# Patient Record
Sex: Male | Born: 1935
Health system: Southern US, Community
[De-identification: ages and names within clinical notes are randomized; demographics above are authoritative.]

## PROBLEM LIST (undated history)

## (undated) DIAGNOSIS — H409 Unspecified glaucoma: Secondary | ICD-10-CM

## (undated) DIAGNOSIS — R519 Headache, unspecified: Secondary | ICD-10-CM

## (undated) DIAGNOSIS — F419 Anxiety disorder, unspecified: Secondary | ICD-10-CM

## (undated) DIAGNOSIS — I8390 Asymptomatic varicose veins of unspecified lower extremity: Secondary | ICD-10-CM

## (undated) DIAGNOSIS — Z923 Personal history of irradiation: Secondary | ICD-10-CM

## (undated) DIAGNOSIS — F0390 Unspecified dementia without behavioral disturbance: Secondary | ICD-10-CM

## (undated) DIAGNOSIS — E039 Hypothyroidism, unspecified: Secondary | ICD-10-CM

## (undated) DIAGNOSIS — G8929 Other chronic pain: Secondary | ICD-10-CM

## (undated) DIAGNOSIS — F1020 Alcohol dependence, uncomplicated: Secondary | ICD-10-CM

## (undated) DIAGNOSIS — C801 Malignant (primary) neoplasm, unspecified: Secondary | ICD-10-CM

## (undated) DIAGNOSIS — M1990 Unspecified osteoarthritis, unspecified site: Secondary | ICD-10-CM

## (undated) DIAGNOSIS — I10 Essential (primary) hypertension: Secondary | ICD-10-CM

## (undated) DIAGNOSIS — E785 Hyperlipidemia, unspecified: Secondary | ICD-10-CM

## (undated) DIAGNOSIS — R51 Headache: Secondary | ICD-10-CM

## (undated) DIAGNOSIS — G4700 Insomnia, unspecified: Secondary | ICD-10-CM

## (undated) DIAGNOSIS — C3490 Malignant neoplasm of unspecified part of unspecified bronchus or lung: Secondary | ICD-10-CM

## (undated) HISTORY — PX: EYE SURGERY: SHX253

## (undated) HISTORY — DX: Hyperlipidemia, unspecified: E78.5

## (undated) HISTORY — DX: Personal history of irradiation: Z92.3

## (undated) HISTORY — DX: Unspecified glaucoma: H40.9

## (undated) HISTORY — DX: Malignant neoplasm of unspecified part of unspecified bronchus or lung: C34.90

## (undated) HISTORY — DX: Other chronic pain: G89.29

## (undated) HISTORY — DX: Alcohol dependence, uncomplicated: F10.20

## (undated) HISTORY — DX: Asymptomatic varicose veins of unspecified lower extremity: I83.90

## (undated) HISTORY — DX: Insomnia, unspecified: G47.00

## (undated) HISTORY — PX: OTHER SURGICAL HISTORY: SHX169

## (undated) HISTORY — DX: Essential (primary) hypertension: I10

## (undated) HISTORY — DX: Anxiety disorder, unspecified: F41.9

---

## 1995-01-16 HISTORY — PX: ANKLE SURGERY: SHX546

## 2000-12-21 ENCOUNTER — Encounter: Payer: Self-pay | Admitting: Family Medicine

## 2000-12-21 ENCOUNTER — Ambulatory Visit (HOSPITAL_COMMUNITY): Admission: RE | Admit: 2000-12-21 | Discharge: 2000-12-21 | Payer: Self-pay | Admitting: Family Medicine

## 2001-07-19 ENCOUNTER — Emergency Department (HOSPITAL_COMMUNITY): Admission: EM | Admit: 2001-07-19 | Discharge: 2001-07-19 | Payer: Self-pay | Admitting: Emergency Medicine

## 2006-07-17 ENCOUNTER — Ambulatory Visit (HOSPITAL_COMMUNITY): Admission: RE | Admit: 2006-07-17 | Discharge: 2006-07-17 | Payer: Self-pay | Admitting: Family Medicine

## 2006-08-02 ENCOUNTER — Encounter (INDEPENDENT_AMBULATORY_CARE_PROVIDER_SITE_OTHER): Payer: Self-pay | Admitting: Interventional Radiology

## 2006-08-02 ENCOUNTER — Ambulatory Visit (HOSPITAL_COMMUNITY): Admission: RE | Admit: 2006-08-02 | Discharge: 2006-08-02 | Payer: Self-pay | Admitting: Family Medicine

## 2006-09-25 ENCOUNTER — Ambulatory Visit (HOSPITAL_COMMUNITY): Admission: RE | Admit: 2006-09-25 | Discharge: 2006-09-25 | Payer: Self-pay | Admitting: Internal Medicine

## 2006-10-30 ENCOUNTER — Ambulatory Visit: Admission: RE | Admit: 2006-10-30 | Discharge: 2007-01-13 | Payer: Self-pay | Admitting: Radiation Oncology

## 2006-10-31 ENCOUNTER — Ambulatory Visit: Payer: Self-pay | Admitting: Dentistry

## 2006-10-31 ENCOUNTER — Encounter: Admission: AD | Admit: 2006-10-31 | Discharge: 2006-10-31 | Payer: Self-pay | Admitting: Dentistry

## 2007-12-31 ENCOUNTER — Ambulatory Visit (HOSPITAL_COMMUNITY): Admission: RE | Admit: 2007-12-31 | Discharge: 2007-12-31 | Payer: Self-pay | Admitting: Internal Medicine

## 2008-03-12 ENCOUNTER — Ambulatory Visit (HOSPITAL_COMMUNITY): Admission: RE | Admit: 2008-03-12 | Discharge: 2008-03-12 | Payer: Self-pay | Admitting: Otolaryngology

## 2008-03-13 ENCOUNTER — Inpatient Hospital Stay (HOSPITAL_COMMUNITY): Admission: EM | Admit: 2008-03-13 | Discharge: 2008-03-18 | Payer: Self-pay | Admitting: Emergency Medicine

## 2008-03-13 ENCOUNTER — Ambulatory Visit: Payer: Self-pay | Admitting: Thoracic Surgery (Cardiothoracic Vascular Surgery)

## 2008-10-28 ENCOUNTER — Ambulatory Visit (HOSPITAL_COMMUNITY): Admission: RE | Admit: 2008-10-28 | Discharge: 2008-10-28 | Payer: Self-pay | Admitting: Internal Medicine

## 2009-03-28 ENCOUNTER — Ambulatory Visit (HOSPITAL_COMMUNITY): Admission: RE | Admit: 2009-03-28 | Discharge: 2009-03-28 | Payer: Self-pay | Admitting: General Surgery

## 2010-02-05 ENCOUNTER — Encounter (HOSPITAL_COMMUNITY): Payer: Self-pay | Admitting: Internal Medicine

## 2010-04-09 LAB — CBC
HCT: 36.1 % — ABNORMAL LOW (ref 39.0–52.0)
MCV: 99.9 fL (ref 78.0–100.0)
Platelets: 149 10*3/uL — ABNORMAL LOW (ref 150–400)
RDW: 13.9 % (ref 11.5–15.5)

## 2010-04-09 LAB — DIFFERENTIAL
Basophils Absolute: 0 10*3/uL (ref 0.0–0.1)
Lymphocytes Relative: 10 % — ABNORMAL LOW (ref 12–46)
Lymphs Abs: 0.6 10*3/uL — ABNORMAL LOW (ref 0.7–4.0)
Monocytes Absolute: 0.7 10*3/uL (ref 0.1–1.0)
Monocytes Relative: 11 % (ref 3–12)
Neutro Abs: 4.4 10*3/uL (ref 1.7–7.7)

## 2010-04-09 LAB — COMPREHENSIVE METABOLIC PANEL
AST: 17 U/L (ref 0–37)
Albumin: 4.6 g/dL (ref 3.5–5.2)
BUN: 21 mg/dL (ref 6–23)
Calcium: 10.5 mg/dL (ref 8.4–10.5)
Creatinine, Ser: 1.5 mg/dL (ref 0.4–1.5)
GFR calc Af Amer: 55 mL/min — ABNORMAL LOW (ref >60)
Total Protein: 7.1 g/dL (ref 6.0–8.3)

## 2010-04-27 LAB — BASIC METABOLIC PANEL
BUN: 18 mg/dL (ref 6–23)
CO2: 21 mEq/L (ref 19–32)
CO2: 22 mEq/L (ref 19–32)
Chloride: 107 mEq/L (ref 96–112)
Chloride: 109 mEq/L (ref 96–112)
Chloride: 110 mEq/L (ref 96–112)
Creatinine, Ser: 1.07 mg/dL (ref 0.4–1.5)
GFR calc Af Amer: >60 mL/min (ref >60)
GFR calc Af Amer: >60 mL/min (ref >60)
GFR calc Af Amer: >60 mL/min (ref >60)
Glucose, Bld: 132 mg/dL — ABNORMAL HIGH (ref 70–99)
Potassium: 3.4 mEq/L — ABNORMAL LOW (ref 3.5–5.1)
Potassium: 3.6 mEq/L (ref 3.5–5.1)
Sodium: 135 mEq/L (ref 135–145)
Sodium: 137 mEq/L (ref 135–145)

## 2010-04-27 LAB — GLUCOSE, CAPILLARY
Glucose-Capillary: 119 mg/dL — ABNORMAL HIGH (ref 70–99)
Glucose-Capillary: 128 mg/dL — ABNORMAL HIGH (ref 70–99)
Glucose-Capillary: 133 mg/dL — ABNORMAL HIGH (ref 70–99)
Glucose-Capillary: 134 mg/dL — ABNORMAL HIGH (ref 70–99)
Glucose-Capillary: 136 mg/dL — ABNORMAL HIGH (ref 70–99)
Glucose-Capillary: 138 mg/dL — ABNORMAL HIGH (ref 70–99)
Glucose-Capillary: 139 mg/dL — ABNORMAL HIGH (ref 70–99)
Glucose-Capillary: 142 mg/dL — ABNORMAL HIGH (ref 70–99)
Glucose-Capillary: 144 mg/dL — ABNORMAL HIGH (ref 70–99)
Glucose-Capillary: 148 mg/dL — ABNORMAL HIGH (ref 70–99)

## 2010-04-27 LAB — COMPREHENSIVE METABOLIC PANEL
ALT: 21 U/L (ref 0–53)
AST: 21 U/L (ref 0–37)
CO2: 21 mEq/L (ref 19–32)
Calcium: 8.9 mg/dL (ref 8.4–10.5)
Chloride: 104 mEq/L (ref 96–112)
Creatinine, Ser: 1.04 mg/dL (ref 0.4–1.5)
GFR calc non Af Amer: >60 mL/min (ref >60)
Glucose, Bld: 130 mg/dL — ABNORMAL HIGH (ref 70–99)
Total Bilirubin: 0.4 mg/dL (ref 0.3–1.2)

## 2010-04-27 LAB — MAGNESIUM: Magnesium: 2 mg/dL (ref 1.5–2.5)

## 2010-04-27 LAB — PHOSPHORUS
Phosphorus: 1.9 mg/dL — ABNORMAL LOW (ref 2.3–4.6)
Phosphorus: 2.8 mg/dL (ref 2.3–4.6)

## 2010-05-02 LAB — DIFFERENTIAL
Basophils Absolute: 0 10*3/uL (ref 0.0–0.1)
Basophils Absolute: 0 10*3/uL (ref 0.0–0.1)
Basophils Relative: 0 % (ref 0–1)
Basophils Relative: 0 % (ref 0–1)
Eosinophils Absolute: 0 K/uL (ref 0.0–0.7)
Eosinophils Relative: 0 % (ref 0–5)
Lymphocytes Relative: 5 % — ABNORMAL LOW (ref 12–46)
Lymphs Abs: 0.5 10*3/uL — ABNORMAL LOW (ref 0.7–4.0)
Monocytes Absolute: 0.6 10*3/uL (ref 0.1–1.0)
Monocytes Relative: 10 % (ref 3–12)
Monocytes Relative: 6 % (ref 3–12)
Neutro Abs: 10.1 10*3/uL — ABNORMAL HIGH (ref 1.7–7.7)
Neutro Abs: 7.1 10*3/uL (ref 1.7–7.7)
Neutrophils Relative %: 82 % — ABNORMAL HIGH (ref 43–77)
Neutrophils Relative %: 89 % — ABNORMAL HIGH (ref 43–77)

## 2010-05-02 LAB — CHOLESTEROL, TOTAL: Cholesterol: 127 mg/dL (ref 0–200)

## 2010-05-02 LAB — POCT I-STAT, CHEM 8
Chloride: 112 mEq/L (ref 96–112)
HCT: 36 % — ABNORMAL LOW (ref 39.0–52.0)
Hemoglobin: 12.2 g/dL — ABNORMAL LOW (ref 13.0–17.0)
Potassium: 3.8 mEq/L (ref 3.5–5.1)
Sodium: 139 mEq/L (ref 135–145)

## 2010-05-02 LAB — GLUCOSE, CAPILLARY
Glucose-Capillary: 136 mg/dL — ABNORMAL HIGH (ref 70–99)
Glucose-Capillary: 141 mg/dL — ABNORMAL HIGH (ref 70–99)
Glucose-Capillary: 161 mg/dL — ABNORMAL HIGH (ref 70–99)

## 2010-05-02 LAB — COMPREHENSIVE METABOLIC PANEL
AST: 16 U/L (ref 0–37)
CO2: 22 mEq/L (ref 19–32)
Calcium: 9.3 mg/dL (ref 8.4–10.5)
Creatinine, Ser: 1.05 mg/dL (ref 0.4–1.5)
GFR calc Af Amer: >60 mL/min (ref >60)
GFR calc non Af Amer: >60 mL/min (ref >60)

## 2010-05-02 LAB — CBC
HCT: 33.1 % — ABNORMAL LOW (ref 39.0–52.0)
HCT: 36.3 % — ABNORMAL LOW (ref 39.0–52.0)
HCT: 37.1 % — ABNORMAL LOW (ref 39.0–52.0)
Hemoglobin: 12.8 g/dL — ABNORMAL LOW (ref 13.0–17.0)
Hemoglobin: 13.1 g/dL (ref 13.0–17.0)
Hemoglobin: 13.9 g/dL (ref 13.0–17.0)
MCHC: 35.1 g/dL (ref 30.0–36.0)
MCHC: 35.2 g/dL (ref 30.0–36.0)
MCHC: 36.2 g/dL — ABNORMAL HIGH (ref 30.0–36.0)
MCV: 102.7 fL — ABNORMAL HIGH (ref 78.0–100.0)
MCV: 103.5 fL — ABNORMAL HIGH (ref 78.0–100.0)
Platelets: 115 10*3/uL — ABNORMAL LOW (ref 150–400)
Platelets: 117 10*3/uL — ABNORMAL LOW (ref 150–400)
Platelets: 143 K/uL — ABNORMAL LOW (ref 150–400)
RBC: 3.34 MIL/uL — ABNORMAL LOW (ref 4.22–5.81)
RBC: 3.59 MIL/uL — ABNORMAL LOW (ref 4.22–5.81)
RDW: 12.3 % (ref 11.5–15.5)
RDW: 12.7 % (ref 11.5–15.5)
RDW: 12.7 % (ref 11.5–15.5)
RDW: 12.9 % (ref 11.5–15.5)
RDW: 13.4 % (ref 11.5–15.5)
WBC: 11.3 10*3/uL — ABNORMAL HIGH (ref 4.0–10.5)

## 2010-05-02 LAB — BASIC METABOLIC PANEL
CO2: 26 mEq/L (ref 19–32)
Calcium: 11 mg/dL — ABNORMAL HIGH (ref 8.4–10.5)
Chloride: 106 mEq/L (ref 96–112)
Creatinine, Ser: 1.49 mg/dL (ref 0.4–1.5)
GFR calc Af Amer: 56 mL/min — ABNORMAL LOW (ref >60)
GFR calc non Af Amer: 46 mL/min — ABNORMAL LOW (ref >60)
GFR calc non Af Amer: 53 mL/min — ABNORMAL LOW (ref >60)
Glucose, Bld: 169 mg/dL — ABNORMAL HIGH (ref 70–99)
Glucose, Bld: 98 mg/dL (ref 70–99)
Potassium: 4.5 mEq/L (ref 3.5–5.1)
Sodium: 140 mEq/L (ref 135–145)
Sodium: 140 mEq/L (ref 135–145)

## 2010-05-02 LAB — TROPONIN I: Troponin I: <0.01 ng/mL (ref 0.00–0.06)

## 2010-05-02 LAB — PHOSPHORUS: Phosphorus: 1.4 mg/dL — ABNORMAL LOW (ref 2.3–4.6)

## 2010-05-30 NOTE — Consult Note (Signed)
NAMEESLEY, BROOKING NO.:  [id number]   MEDICAL RECORD NO.:  [id number]          PATIENT TYPE:  INP   LOCATION:  2305                         FACILITY:  [facility]   PHYSICIAN:  Salvatore Decent. Cornelius Moras, M.D. DATE OF BIRTH:  [birth year]   DATE OF CONSULTATION:  03/13/2008  DATE OF DISCHARGE:                                 CONSULTATION   REQUESTING PHYSICIAN:  Lucky Cowboy, MD   REASON FOR CONSULTATION:  Iatrogenic esophageal perforation.   HISTORY OF PRESENT ILLNESS:  Mr. Manon is a 77 year old white male  with history of hypertension, hyperlipidemia, and longstanding tobacco  abuse as well as history of squamous cell carcinoma of the head and  neck.  At present the [facility] medical record electronic system is  down and old charts are not available.  By report, the patient had  squamous cell carcinoma of the head and neck diagnosed approximately one  year ago.  He has been treated with primary radiation therapy and  chemotherapy.  His last treatment was approximately 8 months ago.  The  patient states that ever since he started radiation therapy, he has been  having severe difficulty with swallowing.  For quite some time, he had a  gastrostomy tube in place, but the patient did not like this and he  ultimately removed his own tube.  His dysphagia has progressed.  Approximately 2 weeks ago, a barium swallow contrast study was performed  demonstrating long segment stricture of the proximal esophagus.  The  patient underwent outpatient esophageal dilatation by Dr. Serena Colonel at  approximately 2 in the afternoon on March 12, 2008.  By report, this  procedure was not done under fluoroscopy.  The patient was reportedly  noted to have a stricture at approximately 18 cm from the incisors.  He  was dilated to a 36-French Maloney dilator.  The procedure was initially  uncomplicated.  The patient states that he started having some chest  discomfort prior to discharge.  After he  got to home, the pain became  severe ultimately prompting him to call and return to the emergency  room.  There he was evaluated by Dr. Langston Reusing.  The chest CT scan was  performed with oral contrast.  The patient was not able to drink much  contrast.  The CT scan demonstrates diffusely thickened esophagus  including the entire esophagus from the cervical esophagus all the way  to the GE junction.  There is some air tracking through the esophageal  wall and mediastinum as well.  Very little contrast was taken and a  definitive site of leak cannot be clearly identified, although the  esophagus itself is diffusely abnormal throughout.  Cardiothoracic  surgical consultation was requested.   REVIEW OF SYSTEMS:  Notable for 20-pound weight loss.  The patient had  difficulty swallowing and has had severe dysphagia for nearly a year.  He has continued to smoke cigarettes.  He denies any chest pain prior to  that which developed today.  Denies any nausea or vomiting.  His bowel  function has been regular.   PAST MEDICAL HISTORY:  1. Squamous cell carcinoma of the head and neck.  2. Hypertension.  3. Hyperlipidemia.  4. Longstanding tobacco abuse.  5. Inguinal hernia.   PAST SURGICAL HISTORY:  Percutaneous endoscopic gastrostomy tube  placement and biopsy of left cervical lymph node.   FAMILY HISTORY:  Noncontributory.   SOCIAL HISTORY:  The patient has history of tobacco abuse.  He denies  alcohol use.   MEDICATIONS PRIOR TO ADMISSION:  1. Amlodipine 2.5 mg daily.  2. Clonazepam 1 mg twice daily.  3. Hydrocodone as necessary for pain.  4. Temazepam as needed.  5. Fenofibrate 160 mg daily.   DRUG ALLERGIES:  None known.   PHYSICAL EXAMINATION:  GENERAL:  The patient is a thin male who appears  his stated age and in no acute distress.  VITAL SIGNS:  He has been afebrile.  Blood pressure ranging between 140  and 170 mmHg systolic.  HEENT:  Notable for a scar on the neck.  NECK:   There is no palpable lymphadenopathy.  LUNGS:  Clear to auscultation.  The chest reveals clear breath sounds,  which are symmetrical.  No wheezes or rhonchi noted.  CARDIOVASCULAR:  Regular rate and rhythm.  No murmurs, rubs, or gallops  are appreciated.  ABDOMEN:  Soft, nondistended, and nontender.  Bowel sounds are present.  EXTREMITIES:  Warm and well perfused.  There is no lower extremity  edema.  RECTAL AND GU:  Both deferred.  NEUROLOGIC:  Nonfocal.   DIAGNOSTIC TESTS:  White blood count is 11,300.  Chest CT scan performed  this evening is reviewed.  Findings are as discussed previously.  The  patient was subsequently brought back to Radiology on my request where  upper GI contrast study was performed.  Initially, the patient was given  Gastrografin and this was followed with thin barium.  Contrast is  rapidly swallowed through the esophagus and there is no obvious site of  perforation at any level.  There is some mild hangup at the GE junction,  but contrast does pass through when rinsed with water.  No sign of  perforation can be clearly identified.   IMPRESSION:  Iatrogenic perforation of the esophagus following Maloney  dilation in the setting of radiation induced esophagitis with esophageal  stricture.  At present, the level of the injury itself cannot be clearly  identified, although presumably it is likely in the proximal esophagus  near the long segment high-grade stricture.  There is not any  extravasation of contrast present at all that can be demonstrated on  barium swallow, there is no sign of any fluid collection in the  mediastinum or chest on CT scan.  I do not feel that surgical  exploration is necessary at this time.  However, the patient's condition  remains guarded.   RECOMMENDATIONS:  I recommend admitting the patient and keeping him  strictly n.p.o., placing him on intravenous antibiotics, and starting  intravenous nutritional support.  We will continue to  follow along  closely.  He may need open gastrostomy tube placement for long-term  nutritional support.      Salvatore Decent. Cornelius Moras, M.D.  Electronically Signed     CHO/MEDQ  D:  03/13/2008  T:  03/13/2008  Job:  [id number]   cc:   Lucky Cowboy, MD  Jeannett Senior. Pollyann Kennedy, MD

## 2010-05-30 NOTE — Op Note (Signed)
Kevin Ho, Kevin Ho NO.:  [id number]   MEDICAL RECORD NO.:  [id number]          PATIENT TYPE:  INP   LOCATION:  2305                         FACILITY:  [facility]   PHYSICIAN:  Jefry H. Pollyann Kennedy, MD     DATE OF BIRTH:  Dec 14, 1931   DATE OF PROCEDURE:  03/12/2008  DATE OF DISCHARGE:                               OPERATIVE REPORT   PREOPERATIVE DIAGNOSIS:  Esophageal stricture secondary to radiation  therapy.   POSTOPERATIVE DIAGNOSIS:  Esophageal stricture secondary to radiation  therapy.   PROCEDURE:  Esophagoscopy with dilatation of esophageal stricture.   SURGEON:  Jefry H. Pollyann Kennedy, MD   ANESTHESIA:  General endotracheal anesthesia was used.   COMPLICATIONS:  None.   ESTIMATED BLOOD LOSS:  Minimal.   FINDINGS:  There was a thin web at about 18 cm from the mandibular  gingiva that was divided and around that area was also a stricture that  was dilated up to a 38-French.   HISTORY:  This 77 year old underwent chemo and radiation for a unknown  primary.  He has been having severe dysphagia ever since.  A barium  esophagram reveals stricture of the cervical esophagus.  Risks,  benefits, alternatives, and complications of the procedure explained to  the patient, seemed to understand, and agreed to surgery.   PROCEDURE:  The patient was taken to the operating room, placed on the  operating table in a supine position.  Following induction of general  endotracheal anesthesia, the table was turned 90 degrees and the patient  was draped in standard fashion.  The rigid cervical esophagoscope was  used to examine the hypopharynx and the esophageal introitus, which  revealed fairly normal-appearing cricopharyngeal region.  The scope was  entered into the esophagus, advanced until the stricture was identified.  The web was identified and was lysed using a 22-French Maloney taper  dilator.  The dilatation was then advanced serially up until that would  no longer fit  through the esophagoscope.  The esophagoscope was then  removed and replaced with a Jackson sliding laryngoscope, which was then  used to elevate the larynx and exposed the esophageal introitus.  Serial  dilations were then performed up to 38-French.  Further dilations were  not attempted.  A small amount of blood was suctioned.  The scope was  removed, and the patient was then awakened, extubated, and transferred  to recovery in stable condition.     Jefry H. Pollyann Kennedy, MD  Electronically Signed    JHR/MEDQ  D:  03/12/2008  T:  03/13/2008  Job:  [id number]

## 2010-05-30 NOTE — Discharge Summary (Signed)
NAMEGLENN, Kevin Ho NO.:  [id number]   MEDICAL RECORD NO.:  [id number]          PATIENT TYPE:  INP   LOCATION:  5153                         FACILITY:  [facility]   PHYSICIAN:  Jefry H. Pollyann Kennedy, MD     DATE OF BIRTH:  1931/08/02   DATE OF ADMISSION:  03/13/2008  DATE OF DISCHARGE:  03/18/2008                               DISCHARGE SUMMARY   ADMISSION DIAGNOSIS:  Status post esophageal dilatation with possible  esophageal perforation.   DISCHARGE DIAGNOSIS:  Status post esophageal dilatation with possible  esophageal perforation.   HISTORY:  This is a 77 year old gentleman who underwent chemo and  radiation therapy for an unknown primary in head and neck area.  He was  found to have esophageal stricture post radiation and was brought in  electively for esophageal dilatation.  Procedure went uneventfully.  He  was discharged to home later that day and then returned later in the  evening when he noticed that drinking caused severe chest pain.  He is  admitted to the hospital.  CT scan revealed severe esophagitis, small  amount of air in the mediastinum without any definite extravasation of  contrast material.  He is admitted by Dr. Lucky Cowboy, one of my partners  and consultation was obtained with Dr. Cornelius Moras from Cardiothoracic Surgery.  The patient was kept n.p.o.  There is no chest exploration that was  performed.  He was treated with intravenous fluid support and started on  total parenteral nutrition.  After about 24 hours, his symptoms  completely resolved.  He remained afebrile with a normal white blood  cell count.  On March 16, 2008, he underwent a barium swallow, which  revealed easy passage of barium through the esophagus without any  extravasation or any evidence of perforation.  Dr. Dewayne Shorter of  Cardiothoracic Surgery was then reconsulted and he agreed with starting  him on a liquid diet.  He was started on liquid diet on March 17, 2008,  did extremely well  and in fact, was swallowing better than he had before  the procedure.  He was advanced to full liquid diet on the day of  discharge, did well with that.  No signs of any additional symptoms.  He  was discharged to home in satisfactory condition.  He is instructed to  maintain a liquid diet for another week and if all goes well, then he  can start solids following that.  He is to follow up with me in the  office in 2 weeks.  Additional followup testing and/or treatments will  be discussed in the future if necessary.  During his hospitalization, he  was found to have severe arthritis in his knees and started undergoing  some physical therapy and rehabilitation, which will be continued as an  outpatient.      Jefry H. Pollyann Kennedy, MD  Electronically Signed     JHR/MEDQ  D:  03/18/2008  T:  03/19/2008  Job:  [id number]   cc:   Lebron Conners. Darovsky, M.D.

## 2010-05-30 NOTE — H&P (Signed)
NAMESHARAD, VANEATON NO.:  [id number]   MEDICAL RECORD NO.:  [id number]          PATIENT TYPE:  INP   LOCATION:  2305                         FACILITY:  [facility]   PHYSICIAN:  Lucky Cowboy, MD         DATE OF BIRTH:  1931-06-16   DATE OF ADMISSION:  03/12/2008  DATE OF DISCHARGE:                              HISTORY & PHYSICAL   HISTORY:  This patient is a 77 year old male who is status post  esophageal dilation by Dr. Serena Colonel earlier today for radiation-  induced/associated esophageal stenosis.  Surgery reportedly was without  complication.  The patient went home and later today  developed mid  chest pain that he describes to be shooting up from the mid esophagus  towards the laryngeal level.  There is pain in the diaphragm area by his  own words.  There is no fever.  He took 2 pain pills at home with some  relief, but not amelioration.  He then had again increasing pain.  He  presented to the emergency room at the urging of his sister-in-law.  This is for further evaluation at my recommendation.  After admission to  the emergency room, he was given 2 more pain pills with improvement in  pain.  He is now asking to go home.   PAST MEDICAL HISTORY:  Hypertension, cancer of the oropharynx, chronic  neck pain, renal insufficiency.   PAST SURGICAL HISTORY:  Vasectomy, appears to be a left neck dissection.   SOCIAL HISTORY:  Nondrinker, no drug abuse, chronic smoker.   ALLERGIES:  No known drug allergies.   MEDICATIONS:  Amlodipine, clonazepam, Vicodin, fenofibrate, temazepam,  and Tylenol.   FAMILY HISTORY:  Reviewed.   PHYSICAL EXAMINATION:  GENERAL:  Resting in bed, appears comfortable.  HEENT:  Nose, no erythema or exudate.  Oral cavity, dry mucous membranes  with geographic tongue.  Thickening of secretions.  NECK:  Normal landmarks.  No thyromegaly.  No crepitus.  CHEST:  Distant breath sounds.  No wheezes or rhonchi.  CARDIAC:  Regular rate and rhythm  without murmur.  ABDOMEN:  Soft, scaphoid with positive bowel sounds.  EXTREMITIES:  No cyanosis, clubbing, or edema.   A chest x-ray, no evidence of perforation, no free air noted, no  evidence of mediastinitis.  Also, white blood cell count 11,300.  Chest  CT concerning for perforation of the lower thoracic esophagus.  Return  barium swallow with contrast using Gastrografin performed,  then  followed by thin barium.  No obvious sign of perforation at any level.   ASSESSMENT/PLAN:  Esophageal perforation at the level of dilation, which  was 18 cm from the lower gingiva.  This was in a patient who has  radiation and possibly reflux-induced chronic esophagitis.  Dr. Tressie Stalker has been consulted and done the latter portion of the workup  including the followup barium and Gastrografin swallow.  He has  recommended n.p.o., admission to the ICU with Zosyn and did write the  initial orders.  He will be on stand-  by if needed.  This has been fully  discussed with the patient and his  daughter-in-law by me.  It was also discussed with Dr. Serena Colonel by  phone, particularly with regard to the findings at the time of surgery,  which were then communicated to Dr. Cornelius Moras.      Lucky Cowboy, MD  Electronically Signed     SJ/MEDQ  D:  03/14/2008  T:  03/15/2008  Job:  [id number]   cc:   Jeannett Senior. Pollyann Kennedy, MD

## 2010-06-14 ENCOUNTER — Encounter: Payer: Self-pay | Admitting: Physician Assistant

## 2010-10-20 LAB — GLUCOSE, CAPILLARY: Glucose-Capillary: 96 mg/dL (ref 70–99)

## 2010-10-30 LAB — CBC
Hemoglobin: 16.6
RBC: 5.13

## 2011-06-14 ENCOUNTER — Encounter: Payer: Medicare HMO | Admitting: Internal Medicine

## 2011-07-14 ENCOUNTER — Encounter (HOSPITAL_BASED_OUTPATIENT_CLINIC_OR_DEPARTMENT_OTHER): Payer: Self-pay | Admitting: Emergency Medicine

## 2011-07-14 ENCOUNTER — Emergency Department (HOSPITAL_BASED_OUTPATIENT_CLINIC_OR_DEPARTMENT_OTHER)
Admission: EM | Admit: 2011-07-14 | Discharge: 2011-07-14 | Disposition: A | Discharge status: Home / Self Care | Payer: Medicare HMO | Attending: Emergency Medicine | Admitting: Emergency Medicine

## 2011-07-14 DIAGNOSIS — E785 Hyperlipidemia, unspecified: Secondary | ICD-10-CM | POA: Insufficient documentation

## 2011-07-14 DIAGNOSIS — R899 Unspecified abnormal finding in specimens from other organs, systems and tissues: Secondary | ICD-10-CM

## 2011-07-14 DIAGNOSIS — I10 Essential (primary) hypertension: Secondary | ICD-10-CM | POA: Insufficient documentation

## 2011-07-14 DIAGNOSIS — Z79899 Other long term (current) drug therapy: Secondary | ICD-10-CM | POA: Insufficient documentation

## 2011-07-14 DIAGNOSIS — R799 Abnormal finding of blood chemistry, unspecified: Secondary | ICD-10-CM | POA: Insufficient documentation

## 2011-07-14 DIAGNOSIS — Z7982 Long term (current) use of aspirin: Secondary | ICD-10-CM | POA: Insufficient documentation

## 2011-07-14 DIAGNOSIS — F411 Generalized anxiety disorder: Secondary | ICD-10-CM | POA: Insufficient documentation

## 2011-07-14 DIAGNOSIS — F17200 Nicotine dependence, unspecified, uncomplicated: Secondary | ICD-10-CM | POA: Insufficient documentation

## 2011-07-14 DIAGNOSIS — G4700 Insomnia, unspecified: Secondary | ICD-10-CM | POA: Insufficient documentation

## 2011-07-14 DIAGNOSIS — Z888 Allergy status to other drugs, medicaments and biological substances status: Secondary | ICD-10-CM | POA: Insufficient documentation

## 2011-07-14 LAB — BASIC METABOLIC PANEL
BUN: 27 mg/dL — ABNORMAL HIGH (ref 6–23)
Chloride: 102 mEq/L (ref 96–112)
GFR calc Af Amer: 38 mL/min — ABNORMAL LOW (ref >90)
Glucose, Bld: 104 mg/dL — ABNORMAL HIGH (ref 70–99)
Potassium: 4.5 mEq/L (ref 3.5–5.1)
Sodium: 139 mEq/L (ref 135–145)

## 2011-07-14 NOTE — ED Notes (Signed)
Son Dorene Sorrow leaving for 30 min. [phone]

## 2011-07-14 NOTE — ED Notes (Signed)
Pt went to PCP at [facility] yesterday for routine visit. Told potassium was elevated and needed to go to ED. Pt states he has been weak since receiving chemo and radiation 1 year ago.

## 2011-07-14 NOTE — ED Provider Notes (Signed)
History     CSN: [id number]  Arrival date & time 07/14/11  1135   First MD Initiated Contact with Patient 07/14/11 1154      Chief Complaint  Patient presents with  . Abnormal Lab    (Consider location/radiation/quality/duration/timing/severity/associated sxs/prior treatment) HPI Patient is a 78 year old male who presents today for repeat laboratory testing since his primary care office contacted him stating that his potassium level was too high. This is checked at their office yesterday. Patient has had absolutely no symptoms and reports that he is feeling well. He denies chest pain, shortness of breath, fever, cough, or any other acute changes in his health. Patient does have a history of some renal insufficiency per his son who is at the bedside but they're unaware of what his baseline creatinine is. Patient has never been on dialysis. Patient is absolutely asymptomatic. There are no other associated or modifying factors. Past Medical History  Diagnosis Date  . Hyperlipidemia   . Hypertension   . Chronic pain   . Anxiety   . Insomnia     Past Surgical History  Procedure Date  . Right inguinal hernia     History reviewed. No pertinent family history.  History  Substance Use Topics  . Smoking status: Current Everyday Smoker -- 1.0 packs/day    Types: Cigarettes  . Smokeless tobacco: Not on file  . Alcohol Use: No      Review of Systems  Constitutional: Negative.   HENT: Negative.   Eyes: Negative.   Respiratory: Negative.   Cardiovascular: Negative.   Gastrointestinal: Negative.   Genitourinary: Negative.   Musculoskeletal: Negative.   Skin: Negative.   Neurological: Negative.   Hematological: Negative.   Psychiatric/Behavioral: Negative.   All other systems reviewed and are negative.    Allergies  Chantix  Home Medications   Current Outpatient Rx  Name Route Sig Dispense Refill  . AMLODIPINE BESYLATE 5 MG PO TABS Oral Take 5 mg by mouth daily.        . ASPIRIN 325 MG PO TABS Oral Take 325 mg by mouth daily.    . BUSPIRONE HCL 5 MG PO TABS Oral Take 5 mg by mouth 3 (three) times daily.      Marland Kitchen HYDROCODONE-ACETAMINOPHEN 5-500 MG PO TABS Oral Take 1 tablet by mouth every 8 (eight) hours as needed.      Marland Kitchen PRAVASTATIN SODIUM 40 MG PO TABS Oral Take 40 mg by mouth daily.    . FENOFIBRATE 160 MG PO TABS Oral Take 160 mg by mouth at bedtime.      Marland Kitchen TEMAZEPAM 30 MG PO CAPS Oral Take 30 mg by mouth at bedtime as needed.        BP 143/60  Pulse 68  Temp 98.4 F (36.9 C) (Oral)  Resp 20  Ht 6\' 8"  (2.032 m)  Wt 139 lb (63.05 kg)  BMI 15.27 kg/m2  SpO2 100%  Physical Exam  Nursing note and vitals reviewed. GEN: Well-developed, chronically ill appearing male in no distress HEENT: Atraumatic, normocephalic.  EYES: PERRLA BL, no scleral icterus. NECK: Trachea midline, no meningismus CV: regular rate and rhythm. No murmurs, rubs, or gallops PULM: No respiratory distress.  No crackles, wheezes, or rales. GI: soft, non-tender. No guarding, rebound, or tenderness. + bowel sounds  GU: deferred Neuro: cranial nerves grossly 2-12 intact, no abnormalities of strength or sensation, A and O x 3 MSK: Patient moves all 4 extremities symmetrically, no deformity, edema, or injury noted Skin: No rashes  petechiae, purpura, or jaundice Psych: no abnormality of mood   ED Course  Procedures (including critical care time)  Indication: possible hyperkalemia Please note this EKG was reviewed extemporaneously by myself.   Date: 07/14/2011  Rate: 61  Rhythm: normal sinus rhythm and premature atrial contractions (PAC)  QRS Axis: indeterminate  Intervals: normal  ST/T Wave abnormalities: nonspecific ST/T changes  Conduction Disutrbances:right bundle branch block and left anterior fascicular block  Narrative Interpretation: notation of new LAFB compared with EKG from 2011  Old EKG Reviewed: changes noted     Labs Reviewed  BASIC METABOLIC PANEL -  Abnormal; Notable for the following:    Glucose, Bld 104 (*)     BUN 27 (*)     Creatinine, Ser 1.90 (*)     GFR calc non Af Amer 32 (*)     GFR calc Af Amer 38 (*)     All other components within normal limits   No results found.   1. Abnormal laboratory test       MDM  Patient was evaluated by myself. Patient had absolutely no symptoms and EKG was performed to evaluate for possible signs of hyperkalemia. Comparison EKG was from 2011. While there was some change in the morphology patient had no acute ischemic changes and absolutely no chest pain or shortness of breath. Patient no signs concerning for hyperkalemia. He did have a an elevated BUN and creatinine ratio but his potassium was 4.5 today. Patient did admit that office staff had some difficulty with getting his blood yesterday and hemolysis may have explained the previous hyperkalemic value on those labs. He had not had a confirmatory lab performed prior to today. Given that patient has no other symptoms and no evidence of hyperkalemia today no further workup was indicated. Patient was discharged in good condition. He can followup with his primary care physician and was told to notify them on Monday of this visit so they're aware of the results.Cyndra Numbers, MD 07/15/11 [phone]

## 2011-07-14 NOTE — Discharge Instructions (Signed)
Your laboratory testing today did not show any elevation of your potassium. Your BUN was 27 and your creatinine was 1.9. Please call your regular doctor on Monday to let them know about your testing today.

## 2011-08-27 ENCOUNTER — Encounter: Payer: Medicare HMO | Admitting: Internal Medicine

## 2011-09-13 ENCOUNTER — Encounter (INDEPENDENT_AMBULATORY_CARE_PROVIDER_SITE_OTHER): Payer: Medicare HMO | Admitting: Internal Medicine

## 2011-09-13 DIAGNOSIS — N189 Chronic kidney disease, unspecified: Secondary | ICD-10-CM

## 2011-09-13 DIAGNOSIS — G939 Disorder of brain, unspecified: Secondary | ICD-10-CM

## 2011-09-13 DIAGNOSIS — R222 Localized swelling, mass and lump, trunk: Secondary | ICD-10-CM

## 2011-09-13 DIAGNOSIS — C760 Malignant neoplasm of head, face and neck: Secondary | ICD-10-CM

## 2011-10-04 ENCOUNTER — Encounter: Payer: Medicare HMO | Admitting: Internal Medicine

## 2011-10-04 DIAGNOSIS — R911 Solitary pulmonary nodule: Secondary | ICD-10-CM

## 2011-10-04 DIAGNOSIS — C760 Malignant neoplasm of head, face and neck: Secondary | ICD-10-CM

## 2011-10-04 DIAGNOSIS — J449 Chronic obstructive pulmonary disease, unspecified: Secondary | ICD-10-CM

## 2012-01-24 ENCOUNTER — Encounter: Payer: Medicare HMO | Admitting: Internal Medicine

## 2012-01-24 DIAGNOSIS — C760 Malignant neoplasm of head, face and neck: Secondary | ICD-10-CM

## 2012-01-24 DIAGNOSIS — R944 Abnormal results of kidney function studies: Secondary | ICD-10-CM

## 2012-03-11 ENCOUNTER — Encounter (INDEPENDENT_AMBULATORY_CARE_PROVIDER_SITE_OTHER): Payer: Medicare HMO | Admitting: Internal Medicine

## 2012-03-11 DIAGNOSIS — C760 Malignant neoplasm of head, face and neck: Secondary | ICD-10-CM

## 2012-03-11 DIAGNOSIS — R42 Dizziness and giddiness: Secondary | ICD-10-CM

## 2012-03-11 DIAGNOSIS — N189 Chronic kidney disease, unspecified: Secondary | ICD-10-CM

## 2012-04-02 ENCOUNTER — Other Ambulatory Visit: Payer: Self-pay | Admitting: *Deleted

## 2012-04-08 ENCOUNTER — Other Ambulatory Visit: Payer: Self-pay | Admitting: Physician Assistant

## 2012-04-08 NOTE — Telephone Encounter (Signed)
Last seen 6/13 

## 2012-04-09 ENCOUNTER — Other Ambulatory Visit: Payer: Self-pay | Admitting: Physician Assistant

## 2012-04-17 ENCOUNTER — Other Ambulatory Visit: Payer: Self-pay | Admitting: Physician Assistant

## 2012-04-17 DIAGNOSIS — F411 Generalized anxiety disorder: Secondary | ICD-10-CM

## 2012-04-17 MED ORDER — BUSPIRONE HCL 5 MG PO TABS: 5.0000 mg | ORAL_TABLET | Freq: Three times a day (TID) | ORAL | Status: DC

## 2012-04-17 NOTE — Telephone Encounter (Signed)
dont see this in his chart, chart is there for you to review

## 2012-04-17 NOTE — Telephone Encounter (Signed)
authorized

## 2012-04-21 ENCOUNTER — Other Ambulatory Visit: Payer: Self-pay | Admitting: Physician Assistant

## 2012-04-21 NOTE — Telephone Encounter (Signed)
Last refill 03/20/12, last seen by [facility] 07/13/11. If approved have nurse call into [facility]

## 2012-05-08 ENCOUNTER — Other Ambulatory Visit: Payer: Self-pay | Admitting: *Deleted

## 2012-05-08 MED ORDER — FENOFIBRATE 160 MG PO TABS: 160.0000 mg | ORAL_TABLET | Freq: Every day | ORAL | Status: DC

## 2012-05-08 NOTE — Telephone Encounter (Signed)
LAS OV 6/13. LAST LABS 6/13

## 2012-05-18 ENCOUNTER — Other Ambulatory Visit: Payer: Self-pay | Admitting: Family Medicine

## 2012-05-19 NOTE — Telephone Encounter (Signed)
Patient last seen in office on 07-13-11. Rx last filled on 4-30. Please advise. Thank you

## 2012-05-20 ENCOUNTER — Other Ambulatory Visit: Payer: Self-pay | Admitting: Family Medicine

## 2012-05-20 NOTE — Telephone Encounter (Signed)
Phoned into [facility] and left message on their voicemail since prescription was requested through [employer].

## 2012-05-21 NOTE — Telephone Encounter (Signed)
LAST RF 04/22/12. CALL IN [facility]. LAST OV 6/13.

## 2012-05-21 NOTE — Telephone Encounter (Signed)
rx called to [facility]- 5/6-jhb-  NA , NO VM at pts #

## 2012-05-21 NOTE — Telephone Encounter (Signed)
Please call in temazepam to Pharmacy

## 2012-05-29 ENCOUNTER — Ambulatory Visit: Payer: Self-pay | Admitting: Nurse Practitioner

## 2012-06-10 ENCOUNTER — Other Ambulatory Visit: Payer: Self-pay | Admitting: Family Medicine

## 2012-06-20 ENCOUNTER — Other Ambulatory Visit: Payer: Self-pay | Admitting: Family Medicine

## 2012-06-20 ENCOUNTER — Ambulatory Visit (INDEPENDENT_AMBULATORY_CARE_PROVIDER_SITE_OTHER): Payer: Medicare HMO | Admitting: Physician Assistant

## 2012-06-20 ENCOUNTER — Encounter: Payer: Self-pay | Admitting: Physician Assistant

## 2012-06-20 VITALS — BP 160/85 | HR 75 | Temp 96.8°F | Ht 63.0 in | Wt 144.0 lb

## 2012-06-20 DIAGNOSIS — E039 Hypothyroidism, unspecified: Secondary | ICD-10-CM

## 2012-06-20 DIAGNOSIS — F411 Generalized anxiety disorder: Secondary | ICD-10-CM

## 2012-06-20 DIAGNOSIS — I10 Essential (primary) hypertension: Secondary | ICD-10-CM

## 2012-06-20 DIAGNOSIS — E785 Hyperlipidemia, unspecified: Secondary | ICD-10-CM | POA: Insufficient documentation

## 2012-06-20 LAB — LIPID PANEL
Cholesterol: 190 mg/dL (ref 0–200)
HDL: 48 mg/dL (ref >39)
Total CHOL/HDL Ratio: 4 Ratio
Triglycerides: 287 mg/dL — ABNORMAL HIGH (ref <150)
VLDL: 57 mg/dL — ABNORMAL HIGH (ref 0–40)

## 2012-06-20 LAB — POCT CBC
Granulocyte percent: 79.8 %G (ref 37–80)
HCT, POC: 46.3 % (ref 43.5–53.7)
Lymph, poc: 1.3 (ref 0.6–3.4)
MCH, POC: 32.2 pg — AB (ref 27–31.2)
MCV: 94.3 fL (ref 80–97)
RBC: 4.9 M/uL (ref 4.69–6.13)
WBC: 7.9 10*3/uL (ref 4.6–10.2)

## 2012-06-20 LAB — BASIC METABOLIC PANEL WITH GFR
BUN: 13 mg/dL (ref 6–23)
CO2: 28 mEq/L (ref 19–32)
Calcium: 10.7 mg/dL — ABNORMAL HIGH (ref 8.4–10.5)
GFR, Est African American: 52 mL/min — ABNORMAL LOW
Glucose, Bld: 92 mg/dL (ref 70–99)
Sodium: 137 mEq/L (ref 135–145)

## 2012-06-20 LAB — HEPATIC FUNCTION PANEL
ALT: 11 U/L (ref 0–53)
AST: 18 U/L (ref 0–37)
Bilirubin, Direct: 0.1 mg/dL (ref 0.0–0.3)
Indirect Bilirubin: 0.4 mg/dL (ref 0.0–0.9)
Total Protein: 7.8 g/dL (ref 6.0–8.3)

## 2012-06-20 MED ORDER — LEVOTHYROXINE SODIUM 75 MCG PO TABS: 75.0000 ug | ORAL_TABLET | Freq: Every day | ORAL | Status: DC

## 2012-06-20 MED ORDER — AMLODIPINE BESYLATE 5 MG PO TABS: 5.0000 mg | ORAL_TABLET | Freq: Every day | ORAL | Status: DC

## 2012-06-20 MED ORDER — PRAVASTATIN SODIUM 40 MG PO TABS: 40.0000 mg | ORAL_TABLET | Freq: Every day | ORAL | Status: DC

## 2012-06-20 NOTE — Patient Instructions (Signed)
Smoking Cessation Quitting smoking is important to your health and has many advantages. However, it is not always easy to quit since nicotine is a very addictive drug. Often times, people try 3 times or more before being able to quit. This document explains the best ways for you to prepare to quit smoking. Quitting takes hard work and a lot of effort, but you can do it. ADVANTAGES OF QUITTING SMOKING  You will live longer, feel better, and live better.  Your body will feel the impact of quitting smoking almost immediately.  Within 20 minutes, blood pressure decreases. Your pulse returns to its normal level.  After 8 hours, carbon monoxide levels in the blood return to normal. Your oxygen level increases.  After 24 hours, the chance of having a heart attack starts to decrease. Your breath, hair, and body stop smelling like smoke.  After 48 hours, damaged nerve endings begin to recover. Your sense of taste and smell improve.  After 72 hours, the body is virtually free of nicotine. Your bronchial tubes relax and breathing becomes easier.  After 2 to 12 weeks, lungs can hold more air. Exercise becomes easier and circulation improves.  The risk of having a heart attack, stroke, cancer, or lung disease is greatly reduced.  After 1 year, the risk of coronary heart disease is cut in half.  After 5 years, the risk of stroke falls to the same as a nonsmoker.  After 10 years, the risk of lung cancer is cut in half and the risk of other cancers decreases significantly.  After 15 years, the risk of coronary heart disease drops, usually to the level of a nonsmoker.  If you are pregnant, quitting smoking will improve your chances of having a healthy baby.  The people you live with, especially any children, will be healthier.  You will have extra money to spend on things other than cigarettes. QUESTIONS TO THINK ABOUT BEFORE ATTEMPTING TO QUIT You may want to talk about your answers with your  caregiver.  Why do you want to quit?  If you tried to quit in the past, what helped and what did not?  What will be the most difficult situations for you after you quit? How will you plan to handle them?  Who can help you through the tough times? Your family? Friends? A caregiver?  What pleasures do you get from smoking? What ways can you still get pleasure if you quit? Here are some questions to ask your caregiver:  How can you help me to be successful at quitting?  What medicine do you think would be best for me and how should I take it?  What should I do if I need more help?  What is smoking withdrawal like? How can I get information on withdrawal? GET READY  Set a quit date.  Change your environment by getting rid of all cigarettes, ashtrays, matches, and lighters in your home, car, or work. Do not let people smoke in your home.  Review your past attempts to quit. Think about what worked and what did not. GET SUPPORT AND ENCOURAGEMENT You have a better chance of being successful if you have help. You can get support in many ways.  Tell your family, friends, and co-workers that you are going to quit and need their support. Ask them not to smoke around you.  Get individual, group, or telephone counseling and support. Programs are available at local hospitals and health centers. Call your local health department for   information about programs in your area.  Spiritual beliefs and practices may help some smokers quit.  Download a "quit meter" on your computer to keep track of quit statistics, such as how long you have gone without smoking, cigarettes not smoked, and money saved.  Get a self-help book about quitting smoking and staying off of tobacco. LEARN NEW SKILLS AND BEHAVIORS  Distract yourself from urges to smoke. Talk to someone, go for a walk, or occupy your time with a task.  Change your normal routine. Take a different route to work. Drink tea instead of coffee.  Eat breakfast in a different place.  Reduce your stress. Take a hot bath, exercise, or read a book.  Plan something enjoyable to do every day. Reward yourself for not smoking.  Explore interactive web-based programs that specialize in helping you quit. GET MEDICINE AND USE IT CORRECTLY Medicines can help you stop smoking and decrease the urge to smoke. Combining medicine with the above behavioral methods and support can greatly increase your chances of successfully quitting smoking.  Nicotine replacement therapy helps deliver nicotine to your body without the negative effects and risks of smoking. Nicotine replacement therapy includes nicotine gum, lozenges, inhalers, nasal sprays, and skin patches. Some may be available over-the-counter and others require a prescription.  Antidepressant medicine helps people abstain from smoking, but how this works is unknown. This medicine is available by prescription.  Nicotinic receptor partial agonist medicine simulates the effect of nicotine in your brain. This medicine is available by prescription. Ask your caregiver for advice about which medicines to use and how to use them based on your health history. Your caregiver will tell you what side effects to look out for if you choose to be on a medicine or therapy. Carefully read the information on the package. Do not use any other product containing nicotine while using a nicotine replacement product.  RELAPSE OR DIFFICULT SITUATIONS Most relapses occur within the first 3 months after quitting. Do not be discouraged if you start smoking again. Remember, most people try several times before finally quitting. You may have symptoms of withdrawal because your body is used to nicotine. You may crave cigarettes, be irritable, feel very hungry, cough often, get headaches, or have difficulty concentrating. The withdrawal symptoms are only temporary. They are strongest when you first quit, but they will go away within  10 14 days. To reduce the chances of relapse, try to:  Avoid drinking alcohol. Drinking lowers your chances of successfully quitting.  Reduce the amount of caffeine you consume. Once you quit smoking, the amount of caffeine in your body increases and can give you symptoms, such as a rapid heartbeat, sweating, and anxiety.  Avoid smokers because they can make you want to smoke.  Do not let weight gain distract you. Many smokers will gain weight when they quit, usually less than 10 pounds. Eat a healthy diet and stay active. You can always lose the weight gained after you quit.  Find ways to improve your mood other than smoking. FOR MORE INFORMATION  www.smokefree.gov  Document Released: 12/26/2000 Document Revised: 07/03/2011 Document Reviewed: 04/12/2011 [employer] Patient Information 2014 [employer].  

## 2012-06-20 NOTE — Progress Notes (Signed)
Subjective:     Patient ID: Kevin Ho, male   DOB: 10/17/1931, 80 y.o.   MRN: [id number]  HPI Pt here for review of hypertension, hyperlipid, and hypothyroidism He is 6 mos past due for f/u He denies CP, SOB, or lower ext edema He states he occasionally will get dizzy with sudden change of position No abd pain or change in bowel/bladder No change in endurance  Review of Systems  All other systems reviewed and are negative.       Objective:   Physical Exam  Nursing note and vitals reviewed. Unkempt appearance Oral- no lesions No JVD/Bruits Heart- RRR w/o M Lungs- CTA Pulses equal in upper ext No lower ext edema Labs pending Orthostatics per chart     Assessment:     1. HTN (hypertension)   2. Other and unspecified hyperlipidemia   3. Anxiety state, unspecified   4. Unspecified hypothyroidism        Plan:     Meds rf x 6 months today Stressed the importance of regular f/u and informed meds would not be filled if he did not have regular f/u Full labs today Will inform of results

## 2012-06-23 ENCOUNTER — Other Ambulatory Visit: Payer: Self-pay | Admitting: *Deleted

## 2012-06-23 ENCOUNTER — Telehealth: Payer: Self-pay | Admitting: *Deleted

## 2012-06-23 ENCOUNTER — Other Ambulatory Visit (INDEPENDENT_AMBULATORY_CARE_PROVIDER_SITE_OTHER): Payer: Medicare HMO

## 2012-06-23 DIAGNOSIS — R899 Unspecified abnormal finding in specimens from other organs, systems and tissues: Secondary | ICD-10-CM

## 2012-06-23 DIAGNOSIS — R6889 Other general symptoms and signs: Secondary | ICD-10-CM

## 2012-06-23 NOTE — Progress Notes (Signed)
Informed pt to come in today to have potassium level redrawn

## 2012-06-23 NOTE — Progress Notes (Signed)
Patient came in for re check on potassium 

## 2012-06-23 NOTE — Telephone Encounter (Signed)
Message copied by Fawn Kirk on Mon Jun 23, 2012  2:11 PM ------      Message from: Inis Sizer      Created: Mon Jun 23, 2012  1:12 PM       Need to repeat K now      Rest of labs in stable for him ------

## 2012-06-24 LAB — BASIC METABOLIC PANEL
BUN: 13 mg/dL (ref 6–23)
Chloride: 102 mEq/L (ref 96–112)
Creat: 1.49 mg/dL — ABNORMAL HIGH (ref 0.50–1.35)
Glucose, Bld: 129 mg/dL — ABNORMAL HIGH (ref 70–99)
Potassium: 5 mEq/L (ref 3.5–5.3)

## 2012-07-21 ENCOUNTER — Other Ambulatory Visit: Payer: Self-pay

## 2012-07-21 NOTE — Telephone Encounter (Signed)
Last seen in June 2014 [facility]  If approved phone in and have nurse notify patient

## 2012-07-22 ENCOUNTER — Telehealth: Payer: Self-pay | Admitting: Physician Assistant

## 2012-07-22 MED ORDER — TEMAZEPAM 30 MG PO CAPS: 30.0000 mg | ORAL_CAPSULE | Freq: Every day | ORAL | Status: DC

## 2012-07-22 NOTE — Telephone Encounter (Signed)
Uses [facility]

## 2012-07-22 NOTE — Telephone Encounter (Signed)
Please phone in temazepam rx with 2 refills

## 2012-07-23 NOTE — Telephone Encounter (Signed)
Done

## 2012-07-30 NOTE — Telephone Encounter (Signed)
Ok to rf x 1

## 2012-07-31 NOTE — Telephone Encounter (Signed)
Ok rf x 1 

## 2012-08-04 MED ORDER — TEMAZEPAM 30 MG PO CAPS: 30.0000 mg | ORAL_CAPSULE | Freq: Every day | ORAL | Status: DC

## 2012-10-20 ENCOUNTER — Other Ambulatory Visit: Payer: Self-pay | Admitting: Nurse Practitioner

## 2012-10-21 NOTE — Telephone Encounter (Signed)
Last seen 06/20/12  [facility] 

## 2012-10-21 NOTE — Telephone Encounter (Signed)
Please call in tamezapam rx

## 2012-10-22 ENCOUNTER — Other Ambulatory Visit: Payer: Self-pay | Admitting: Nurse Practitioner

## 2012-10-22 NOTE — Telephone Encounter (Signed)
Called into the pharmacy.

## 2012-12-16 ENCOUNTER — Other Ambulatory Visit: Payer: Self-pay | Admitting: Nurse Practitioner

## 2012-12-16 ENCOUNTER — Other Ambulatory Visit: Payer: Self-pay | Admitting: Physician Assistant

## 2012-12-18 NOTE — Telephone Encounter (Signed)
Last filled 10/20/12 with 1 RF, last seen by [facility] 06/14

## 2012-12-18 NOTE — Telephone Encounter (Signed)
Please call in temazepam rx

## 2012-12-19 NOTE — Telephone Encounter (Signed)
rx called into pharmacy

## 2013-01-20 ENCOUNTER — Other Ambulatory Visit: Payer: Self-pay | Admitting: Family Medicine

## 2013-01-23 ENCOUNTER — Other Ambulatory Visit: Payer: Self-pay | Admitting: Family Medicine

## 2013-01-27 ENCOUNTER — Encounter: Payer: Self-pay | Admitting: Family Medicine

## 2013-01-27 ENCOUNTER — Other Ambulatory Visit: Payer: Self-pay

## 2013-01-27 ENCOUNTER — Ambulatory Visit (INDEPENDENT_AMBULATORY_CARE_PROVIDER_SITE_OTHER): Payer: Medicare HMO | Admitting: Family Medicine

## 2013-01-27 VITALS — BP 157/79 | HR 58 | Temp 96.8°F | Ht 63.0 in | Wt 139.0 lb

## 2013-01-27 DIAGNOSIS — Z716 Tobacco abuse counseling: Secondary | ICD-10-CM

## 2013-01-27 DIAGNOSIS — F1010 Alcohol abuse, uncomplicated: Secondary | ICD-10-CM

## 2013-01-27 DIAGNOSIS — I10 Essential (primary) hypertension: Secondary | ICD-10-CM

## 2013-01-27 DIAGNOSIS — E785 Hyperlipidemia, unspecified: Secondary | ICD-10-CM

## 2013-01-27 DIAGNOSIS — E039 Hypothyroidism, unspecified: Secondary | ICD-10-CM

## 2013-01-27 DIAGNOSIS — F17200 Nicotine dependence, unspecified, uncomplicated: Secondary | ICD-10-CM

## 2013-01-27 DIAGNOSIS — Z7189 Other specified counseling: Secondary | ICD-10-CM

## 2013-01-27 LAB — POCT CBC
Granulocyte percent: 73.3 %G (ref 37–80)
HCT, POC: 47 % (ref 43.5–53.7)
Hemoglobin: 14.4 g/dL (ref 14.1–18.1)
LYMPH, POC: 1.3 (ref 0.6–3.4)
MCH, POC: 28.9 pg (ref 27–31.2)
MCHC: 30.7 g/dL — AB (ref 31.8–35.4)
MCV: 94.2 fL (ref 80–97)
MPV: 7.7 fL (ref 0–99.8)
PLATELET COUNT, POC: 218 10*3/uL (ref 142–424)
POC GRANULOCYTE: 5.1 (ref 2–6.9)
POC LYMPH %: 19.5 % (ref 10–50)
RBC: 5 M/uL (ref 4.69–6.13)
RDW, POC: 12.9 %
WBC: 6.9 10*3/uL (ref 4.6–10.2)

## 2013-01-27 MED ORDER — LISINOPRIL 5 MG PO TABS: 5.0000 mg | ORAL_TABLET | Freq: Every day | ORAL | Status: DC

## 2013-01-27 MED ORDER — AMLODIPINE BESYLATE 5 MG PO TABS: 5.0000 mg | ORAL_TABLET | Freq: Every day | ORAL | Status: DC

## 2013-01-27 MED ORDER — PRAVASTATIN SODIUM 40 MG PO TABS: 40.0000 mg | ORAL_TABLET | Freq: Every day | ORAL | Status: DC

## 2013-01-27 MED ORDER — LEVOTHYROXINE SODIUM 75 MCG PO TABS: 75.0000 ug | ORAL_TABLET | Freq: Every day | ORAL | Status: DC

## 2013-01-27 NOTE — Telephone Encounter (Signed)
Seen 01/27/13  Dr Ernestina Patches  Last lipid 06/20/12

## 2013-01-27 NOTE — Patient Instructions (Signed)
Smoking Cessation Quitting smoking is important to your health and has many advantages. However, it is not always easy to quit since nicotine is a very addictive drug. Often times, people try 3 times or more before being able to quit. This document explains the best ways for you to prepare to quit smoking. Quitting takes hard work and a lot of effort, but you can do it. ADVANTAGES OF QUITTING SMOKING  You will live longer, feel better, and live better.  Your body will feel the impact of quitting smoking almost immediately.  Within 20 minutes, blood pressure decreases. Your pulse returns to its normal level.  After 8 hours, carbon monoxide levels in the blood return to normal. Your oxygen level increases.  After 24 hours, the chance of having a heart attack starts to decrease. Your breath, hair, and body stop smelling like smoke.  After 48 hours, damaged nerve endings begin to recover. Your sense of taste and smell improve.  After 72 hours, the body is virtually free of nicotine. Your bronchial tubes relax and breathing becomes easier.  After 2 to 12 weeks, lungs can hold more air. Exercise becomes easier and circulation improves.  The risk of having a heart attack, stroke, cancer, or lung disease is greatly reduced.  After 1 year, the risk of coronary heart disease is cut in half.  After 5 years, the risk of stroke falls to the same as a nonsmoker.  After 10 years, the risk of lung cancer is cut in half and the risk of other cancers decreases significantly.  After 15 years, the risk of coronary heart disease drops, usually to the level of a nonsmoker.  If you are pregnant, quitting smoking will improve your chances of having a healthy baby.  The people you live with, especially any children, will be healthier.  You will have extra money to spend on things other than cigarettes. QUESTIONS TO THINK ABOUT BEFORE ATTEMPTING TO QUIT You may want to talk about your answers with your  caregiver.  Why do you want to quit?  If you tried to quit in the past, what helped and what did not?  What will be the most difficult situations for you after you quit? How will you plan to handle them?  Who can help you through the tough times? Your family? Friends? A caregiver?  What pleasures do you get from smoking? What ways can you still get pleasure if you quit? Here are some questions to ask your caregiver:  How can you help me to be successful at quitting?  What medicine do you think would be best for me and how should I take it?  What should I do if I need more help?  What is smoking withdrawal like? How can I get information on withdrawal? GET READY  Set a quit date.  Change your environment by getting rid of all cigarettes, ashtrays, matches, and lighters in your home, car, or work. Do not let people smoke in your home.  Review your past attempts to quit. Think about what worked and what did not. GET SUPPORT AND ENCOURAGEMENT You have a better chance of being successful if you have help. You can get support in many ways.  Tell your family, friends, and co-workers that you are going to quit and need their support. Ask them not to smoke around you.  Get individual, group, or telephone counseling and support. Programs are available at local hospitals and health centers. Call your local health department for   information about programs in your area.  Spiritual beliefs and practices may help some smokers quit.  Download a "quit meter" on your computer to keep track of quit statistics, such as how long you have gone without smoking, cigarettes not smoked, and money saved.  Get a self-help book about quitting smoking and staying off of tobacco. LEARN NEW SKILLS AND BEHAVIORS  Distract yourself from urges to smoke. Talk to someone, go for a walk, or occupy your time with a task.  Change your normal routine. Take a different route to work. Drink tea instead of coffee.  Eat breakfast in a different place.  Reduce your stress. Take a hot bath, exercise, or read a book.  Plan something enjoyable to do every day. Reward yourself for not smoking.  Explore interactive web-based programs that specialize in helping you quit. GET MEDICINE AND USE IT CORRECTLY Medicines can help you stop smoking and decrease the urge to smoke. Combining medicine with the above behavioral methods and support can greatly increase your chances of successfully quitting smoking.  Nicotine replacement therapy helps deliver nicotine to your body without the negative effects and risks of smoking. Nicotine replacement therapy includes nicotine gum, lozenges, inhalers, nasal sprays, and skin patches. Some may be available over-the-counter and others require a prescription.  Antidepressant medicine helps people abstain from smoking, but how this works is unknown. This medicine is available by prescription.  Nicotinic receptor partial agonist medicine simulates the effect of nicotine in your brain. This medicine is available by prescription. Ask your caregiver for advice about which medicines to use and how to use them based on your health history. Your caregiver will tell you what side effects to look out for if you choose to be on a medicine or therapy. Carefully read the information on the package. Do not use any other product containing nicotine while using a nicotine replacement product.  RELAPSE OR DIFFICULT SITUATIONS Most relapses occur within the first 3 months after quitting. Do not be discouraged if you start smoking again. Remember, most people try several times before finally quitting. You may have symptoms of withdrawal because your body is used to nicotine. You may crave cigarettes, be irritable, feel very hungry, cough often, get headaches, or have difficulty concentrating. The withdrawal symptoms are only temporary. They are strongest when you first quit, but they will go away within  10 14 days. To reduce the chances of relapse, try to:  Avoid drinking alcohol. Drinking lowers your chances of successfully quitting.  Reduce the amount of caffeine you consume. Once you quit smoking, the amount of caffeine in your body increases and can give you symptoms, such as a rapid heartbeat, sweating, and anxiety.  Avoid smokers because they can make you want to smoke.  Do not let weight gain distract you. Many smokers will gain weight when they quit, usually less than 10 pounds. Eat a healthy diet and stay active. You can always lose the weight gained after you quit.  Find ways to improve your mood other than smoking. FOR MORE INFORMATION  www.smokefree.gov  Document Released: 12/26/2000 Document Revised: 07/03/2011 Document Reviewed: 04/12/2011 [employer] Patient Information 2014 [employer].  

## 2013-01-27 NOTE — Progress Notes (Signed)
Subjective:   Patient here for follow-up of elevated blood pressure.  He is not exercising and is not adherent to a low-salt diet.  Blood pressure is not monitored at home. Cardiac symptoms: none. Patient denies: chest pain, chest pressure/discomfort, claudication, exertional chest pressure/discomfort, fatigue and tachypnea. Cardiovascular risk factors: advanced age (older than 62 for men, 75 for women), dyslipidemia, male gender and smoking/ tobacco exposure. Use of agents associated with hypertension: none. History of target organ damage: none. Has had some mild dizziness with norvasc use.  Has been off of all medications for the last 2 days.    The following portions of the patient's history were reviewed and updated as appropriate: allergies, current medications, past family history, past medical history, past social history, past surgical history and problem list.  Pt states that he would possibly like to come off of some of his medications.  Still smoking 1PPD (60 pack year smoker).  Also prior daily ETOH up until 5-6 years ago ( was drinking 6-12 beers per day).   Review of Systems  Pertinent items are noted in HPI.     Objective:    BP 157/79  Pulse 58  Temp(Src) 96.8 F (36 C) (Oral)  Ht 5\' 3"  (1.6 m)  Wt 139 lb (63.05 kg)  BMI 24.63 kg/m2 General appearance: alert and cooperative, mildly disshevled appearing  Head: Normocephalic, without obvious abnormality, atraumatic Eyes: conjunctivae/corneas clear. PERRL, EOM's intact. Fundi benign. Ears: normal TM's and external ear canals both ears Lungs: clear to auscultation bilaterally Chest wall: no tenderness Heart: regular rate and rhythm, S1, S2 normal, no murmur, click, rub or gallop Abdomen: soft, non-tender; bowel sounds normal; no masses,  no organomegaly Extremities: extremities normal, atraumatic, no cyanosis or edema Pulses: 2+ and symmetric Skin: Skin color, texture, turgor normal. No rashes or lesions Neurologic:  Grossly normal    Assessment/Plan:  Hypothyroidism - Plan: TSH  HTN (hypertension) - Plan: POCT CBC, Comprehensive metabolic panel, POCT K0S, NMR, lipoprofile, lisinopril (PRINIVIL,ZESTRIL) 5 MG tablet, DISCONTINUED: amLODipine (NORVASC) 5 MG tablet  Alcohol abuse - Plan: Ammonia  Other and unspecified hyperlipidemia - Plan: pravastatin (PRAVACHOL) 40 MG tablet  Unspecified hypothyroidism - Plan: levothyroxine (SYNTHROID, LEVOTHROID) 75 MCG tablet  Tobacco abuse counseling  Will check baseline labs.  Discussed smoking cessation at length. Pt declined pneumovax, colonoscopy, and zostavax info.  Change from norvasc to lisinopril given dizziness.  Follow up pending bloodwork.

## 2013-01-28 LAB — COMPREHENSIVE METABOLIC PANEL
ALT: 7 IU/L (ref 0–44)
AST: 12 IU/L (ref 0–40)
Albumin/Globulin Ratio: 1.8 (ref 1.1–2.5)
Albumin: 5.2 g/dL — ABNORMAL HIGH (ref 3.5–4.8)
Alkaline Phosphatase: 38 IU/L — ABNORMAL LOW (ref 39–117)
BUN/Creatinine Ratio: 12 (ref 10–22)
BUN: 22 mg/dL (ref 8–27)
CALCIUM: 10.8 mg/dL — AB (ref 8.6–10.2)
CHLORIDE: 99 mmol/L (ref 97–108)
CO2: 24 mmol/L (ref 18–29)
Creatinine, Ser: 1.86 mg/dL — ABNORMAL HIGH (ref 0.76–1.27)
GFR calc Af Amer: 39 mL/min/{1.73_m2} — ABNORMAL LOW (ref >59)
GFR calc non Af Amer: 34 mL/min/{1.73_m2} — ABNORMAL LOW (ref >59)
GLUCOSE: 91 mg/dL (ref 65–99)
Globulin, Total: 2.9 g/dL (ref 1.5–4.5)
POTASSIUM: 4.6 mmol/L (ref 3.5–5.2)
Sodium: 139 mmol/L (ref 134–144)
TOTAL PROTEIN: 8.1 g/dL (ref 6.0–8.5)
Total Bilirubin: 0.3 mg/dL (ref 0.0–1.2)

## 2013-01-28 LAB — AMMONIA: Ammonia: 56 ug/dL (ref 27–102)

## 2013-01-28 LAB — NMR, LIPOPROFILE
Cholesterol: 178 mg/dL (ref <200)
HDL Cholesterol by NMR: 62 mg/dL (ref >=40)
HDL PARTICLE NUMBER: 41.4 umol/L (ref >=30.5)
LDL PARTICLE NUMBER: 1077 nmol/L — AB (ref <1000)
LDL SIZE: 20.8 nm (ref >20.5)
LDLC SERPL CALC-MCNC: 74 mg/dL (ref <100)
LP-IR SCORE: 70 — AB (ref <=45)
Small LDL Particle Number: 626 nmol/L — ABNORMAL HIGH (ref <=527)
Triglycerides by NMR: 211 mg/dL — ABNORMAL HIGH (ref <150)

## 2013-01-28 LAB — TSH: TSH: 2.11 u[IU]/mL (ref 0.450–4.500)

## 2013-01-29 ENCOUNTER — Telehealth: Payer: Self-pay | Admitting: Family Medicine

## 2013-02-05 ENCOUNTER — Encounter: Payer: Self-pay | Admitting: *Deleted

## 2013-02-13 ENCOUNTER — Other Ambulatory Visit (INDEPENDENT_AMBULATORY_CARE_PROVIDER_SITE_OTHER): Payer: Medicare HMO

## 2013-02-13 ENCOUNTER — Other Ambulatory Visit: Payer: Self-pay | Admitting: *Deleted

## 2013-02-13 DIAGNOSIS — N189 Chronic kidney disease, unspecified: Secondary | ICD-10-CM

## 2013-02-13 NOTE — Progress Notes (Signed)
Patient here for labs only. 

## 2013-02-16 ENCOUNTER — Other Ambulatory Visit: Payer: Self-pay | Admitting: Nurse Practitioner

## 2013-02-17 NOTE — Telephone Encounter (Signed)
Last seen 01/27/13  Dr Ernestina Patches

## 2013-02-18 ENCOUNTER — Other Ambulatory Visit: Payer: Self-pay | Admitting: Family Medicine

## 2013-02-18 LAB — COMPREHENSIVE METABOLIC PANEL
A/G RATIO: 1.9 (ref 1.1–2.5)
ALT: 6 IU/L (ref 0–44)
AST: 11 IU/L (ref 0–40)
Albumin: 5 g/dL — ABNORMAL HIGH (ref 3.5–4.8)
Alkaline Phosphatase: 37 IU/L — ABNORMAL LOW (ref 39–117)
BUN/Creatinine Ratio: 14 (ref 10–22)
BUN: 28 mg/dL — ABNORMAL HIGH (ref 8–27)
CALCIUM: 11.1 mg/dL — AB (ref 8.6–10.2)
CO2: 25 mmol/L (ref 18–29)
CREATININE: 2.01 mg/dL — AB (ref 0.76–1.27)
Chloride: 98 mmol/L (ref 97–108)
GFR calc Af Amer: 35 mL/min/{1.73_m2} — ABNORMAL LOW (ref >59)
GFR, EST NON AFRICAN AMERICAN: 31 mL/min/{1.73_m2} — AB (ref >59)
GLUCOSE: 111 mg/dL — AB (ref 65–99)
Globulin, Total: 2.7 g/dL (ref 1.5–4.5)
POTASSIUM: 5.2 mmol/L (ref 3.5–5.2)
Sodium: 141 mmol/L (ref 134–144)
TOTAL PROTEIN: 7.7 g/dL (ref 6.0–8.5)
Total Bilirubin: 0.3 mg/dL (ref 0.0–1.2)

## 2013-02-18 LAB — CALCIUM, IONIZED: CALCIUM ION: 5.6 mg/dL (ref 4.5–5.6)

## 2013-02-19 NOTE — Telephone Encounter (Signed)
Message copied by Shelbie Ammons on Thu Feb 19, 2013 12:52 PM ------      Message from: Deneise Lever      Created: Wed Feb 18, 2013  9:30 AM       Ionized calcium WNL, but serum Ca still seems to be rising.       This may be from renal insufficiency with secondary ? HyperPTH.       Please check vitamin D level and intact PTH as part of labs.      Where are we with previous renal referral?       ------

## 2013-02-19 NOTE — Telephone Encounter (Signed)
No vm set up for him or daughter.  Sent message to referrals and lab to follow up on doctor's requests.

## 2013-02-20 ENCOUNTER — Ambulatory Visit: Payer: Medicare HMO | Admitting: Family Medicine

## 2013-02-20 LAB — SPECIMEN STATUS REPORT

## 2013-02-24 ENCOUNTER — Ambulatory Visit (INDEPENDENT_AMBULATORY_CARE_PROVIDER_SITE_OTHER): Payer: Medicare HMO | Admitting: Family Medicine

## 2013-02-24 ENCOUNTER — Encounter: Payer: Self-pay | Admitting: Family Medicine

## 2013-02-24 VITALS — BP 150/75 | HR 69 | Temp 96.6°F | Ht 63.0 in | Wt 137.0 lb

## 2013-02-24 DIAGNOSIS — E785 Hyperlipidemia, unspecified: Secondary | ICD-10-CM

## 2013-02-24 DIAGNOSIS — N289 Disorder of kidney and ureter, unspecified: Secondary | ICD-10-CM

## 2013-02-24 MED ORDER — FENOFIBRATE 160 MG PO TABS: 160.0000 mg | ORAL_TABLET | Freq: Every day | ORAL | Status: DC

## 2013-02-24 NOTE — Progress Notes (Signed)
   Subjective:    Patient ID: Kevin Ho, male    DOB: [birth year], 80 y.o.   MRN: [id number]  HPI Patient presents today for general followup and followup of elevated calcium level. Would like a refill on his fenofibrate. Denies any muscle aches or pains. This eats somewhat of a high fat diet. Patient also noted elevated calcium for most recent labs with concomitant stage III or greater chronic kidney disease. Of note, corrected calcium @ 10.3 from last lab work ([facility]).  Is pending a renal followup currently. Intact PTH and vitamin D level are pending today. Patient otherwise denies any issues including chest pain, shortness of breath, abdominal pain, polyuria.   Review of Systems  All other systems reviewed and are negative.       Objective:   Physical Exam  Constitutional:  Underweight, mildly disheveled appearing.  HENT:  Head: Normocephalic and atraumatic.  Eyes: Conjunctivae are normal. Pupils are equal, round, and reactive to light.  Neck: Normal range of motion.  Cardiovascular: Normal rate and regular rhythm.   Pulmonary/Chest: Effort normal and breath sounds normal.  Abdominal: Soft.  Musculoskeletal: Normal range of motion.  Neurological: He is alert.  Skin: Skin is warm.          Assessment & Plan:  HLD (hyperlipidemia) - Plan: fenofibrate 160 MG tablet, Comprehensive metabolic panel, NMR, lipoprofile, CK total and CKMB (cardiac)  Fenofibrate refill today. We'll check lipid panel as well as CK.  Also check vitamin D level and intact PTH Follow up with renal as previously discussed.  Quit smoking.

## 2013-02-25 LAB — NMR, LIPOPROFILE
Cholesterol: 152 mg/dL (ref <200)
HDL Cholesterol by NMR: 58 mg/dL (ref >=40)
HDL Particle Number: 41.5 umol/L (ref >=30.5)
LDL PARTICLE NUMBER: 830 nmol/L (ref <1000)
LDL SIZE: 20.4 nm — AB (ref >20.5)
LDLC SERPL CALC-MCNC: 69 mg/dL (ref <100)
LP-IR SCORE: 55 — AB (ref <=45)
Small LDL Particle Number: 571 nmol/L — ABNORMAL HIGH (ref <=527)
Triglycerides by NMR: 125 mg/dL (ref <150)

## 2013-02-25 LAB — COMPREHENSIVE METABOLIC PANEL
ALBUMIN: 4.7 g/dL (ref 3.5–4.8)
ALK PHOS: 34 IU/L — AB (ref 39–117)
ALT: 7 IU/L (ref 0–44)
AST: 15 IU/L (ref 0–40)
Albumin/Globulin Ratio: 1.6 (ref 1.1–2.5)
BILIRUBIN TOTAL: 0.4 mg/dL (ref 0.0–1.2)
BUN / CREAT RATIO: 14 (ref 10–22)
BUN: 27 mg/dL (ref 8–27)
CHLORIDE: 101 mmol/L (ref 97–108)
CO2: 23 mmol/L (ref 18–29)
Calcium: 11.3 mg/dL — ABNORMAL HIGH (ref 8.6–10.2)
Creatinine, Ser: 1.98 mg/dL — ABNORMAL HIGH (ref 0.76–1.27)
GFR calc non Af Amer: 31 mL/min/{1.73_m2} — ABNORMAL LOW (ref >59)
GFR, EST AFRICAN AMERICAN: 36 mL/min/{1.73_m2} — AB (ref >59)
Globulin, Total: 2.9 g/dL (ref 1.5–4.5)
Glucose: 90 mg/dL (ref 65–99)
POTASSIUM: 6.6 mmol/L — AB (ref 3.5–5.2)
SODIUM: 141 mmol/L (ref 134–144)
Total Protein: 7.6 g/dL (ref 6.0–8.5)

## 2013-02-25 LAB — CK TOTAL AND CKMB (NOT AT ARMC)
CK TOTAL: 139 U/L (ref 24–204)
CK-MB Index: 3.1 ng/mL (ref 0.0–5.0)

## 2013-02-26 ENCOUNTER — Emergency Department (HOSPITAL_COMMUNITY)
Admission: EM | Admit: 2013-02-26 | Discharge: 2013-02-26 | Disposition: A | Discharge status: Home / Self Care | Payer: Medicare HMO | Attending: Emergency Medicine | Admitting: Emergency Medicine

## 2013-02-26 ENCOUNTER — Encounter (HOSPITAL_COMMUNITY): Payer: Self-pay | Admitting: Emergency Medicine

## 2013-02-26 DIAGNOSIS — M129 Arthropathy, unspecified: Secondary | ICD-10-CM | POA: Insufficient documentation

## 2013-02-26 DIAGNOSIS — F17200 Nicotine dependence, unspecified, uncomplicated: Secondary | ICD-10-CM | POA: Insufficient documentation

## 2013-02-26 DIAGNOSIS — G4700 Insomnia, unspecified: Secondary | ICD-10-CM | POA: Insufficient documentation

## 2013-02-26 DIAGNOSIS — Z7982 Long term (current) use of aspirin: Secondary | ICD-10-CM | POA: Insufficient documentation

## 2013-02-26 DIAGNOSIS — Z8659 Personal history of other mental and behavioral disorders: Secondary | ICD-10-CM | POA: Insufficient documentation

## 2013-02-26 DIAGNOSIS — Z859 Personal history of malignant neoplasm, unspecified: Secondary | ICD-10-CM | POA: Insufficient documentation

## 2013-02-26 DIAGNOSIS — E875 Hyperkalemia: Secondary | ICD-10-CM | POA: Insufficient documentation

## 2013-02-26 DIAGNOSIS — G8929 Other chronic pain: Secondary | ICD-10-CM | POA: Insufficient documentation

## 2013-02-26 DIAGNOSIS — I10 Essential (primary) hypertension: Secondary | ICD-10-CM | POA: Insufficient documentation

## 2013-02-26 DIAGNOSIS — E785 Hyperlipidemia, unspecified: Secondary | ICD-10-CM | POA: Insufficient documentation

## 2013-02-26 DIAGNOSIS — Z79899 Other long term (current) drug therapy: Secondary | ICD-10-CM | POA: Insufficient documentation

## 2013-02-26 HISTORY — DX: Malignant (primary) neoplasm, unspecified: C80.1

## 2013-02-26 HISTORY — DX: Unspecified osteoarthritis, unspecified site: M19.90

## 2013-02-26 LAB — CBC WITH DIFFERENTIAL/PLATELET
BASOS ABS: 0.1 10*3/uL (ref 0.0–0.1)
BASOS PCT: 1 % (ref 0–1)
EOS ABS: 0.6 10*3/uL (ref 0.0–0.7)
Eosinophils Relative: 9 % — ABNORMAL HIGH (ref 0–5)
HCT: 40.1 % (ref 39.0–52.0)
Hemoglobin: 13.6 g/dL (ref 13.0–17.0)
Lymphocytes Relative: 19 % (ref 12–46)
Lymphs Abs: 1.2 10*3/uL (ref 0.7–4.0)
MCH: 31.9 pg (ref 26.0–34.0)
MCHC: 33.9 g/dL (ref 30.0–36.0)
MCV: 94.1 fL (ref 78.0–100.0)
Monocytes Absolute: 1.1 10*3/uL — ABNORMAL HIGH (ref 0.1–1.0)
Monocytes Relative: 17 % — ABNORMAL HIGH (ref 3–12)
Neutro Abs: 3.5 10*3/uL (ref 1.7–7.7)
Neutrophils Relative %: 55 % (ref 43–77)
Platelets: 207 10*3/uL (ref 150–400)
RBC: 4.26 MIL/uL (ref 4.22–5.81)
RDW: 12.8 % (ref 11.5–15.5)
WBC: 6.3 10*3/uL (ref 4.0–10.5)

## 2013-02-26 LAB — BASIC METABOLIC PANEL
BUN: 28 mg/dL — AB (ref 6–23)
CO2: 28 mEq/L (ref 19–32)
Calcium: 11 mg/dL — ABNORMAL HIGH (ref 8.4–10.5)
Chloride: 101 mEq/L (ref 96–112)
Creatinine, Ser: 1.98 mg/dL — ABNORMAL HIGH (ref 0.50–1.35)
GFR calc non Af Amer: 30 mL/min — ABNORMAL LOW (ref >90)
GFR, EST AFRICAN AMERICAN: 35 mL/min — AB (ref >90)
Glucose, Bld: 107 mg/dL — ABNORMAL HIGH (ref 70–99)
POTASSIUM: 5.9 meq/L — AB (ref 3.7–5.3)
Sodium: 141 mEq/L (ref 137–147)

## 2013-02-26 NOTE — ED Provider Notes (Signed)
CSN: [id number]     Arrival date & time 02/26/13  1521 History   First MD Initiated Contact with Patient 02/26/13 1651     Chief Complaint  Patient presents with  . hyperkalemia      (Consider location/radiation/quality/duration/timing/severity/associated sxs/prior Treatment) HPI .Marland Kitchen... patient seen by primary care doctor 2 days ago and routine blood work was done. Apparently potassium was 6.6.   He was instructed to go the emergency department. He has no specific complaints. Specifically no neurological deficits, weakness, chest pain, dyspnea.   Past Medical History  Diagnosis Date  . Hyperlipidemia   . Hypertension   . Chronic pain   . Anxiety   . Insomnia   . Arthritis   . Cancer    Past Surgical History  Procedure Laterality Date  . Right inguinal hernia     No family history on file. History  Substance Use Topics  . Smoking status: Current Every Day Smoker -- 1.00 packs/day    Types: Cigarettes  . Smokeless tobacco: Not on file  . Alcohol Use: No    Review of Systems  All other systems reviewed and are negative.      Allergies  Chantix  Home Medications   Current Outpatient Rx  Name  Route  Sig  Dispense  Refill  . aspirin 325 MG tablet   Oral   Take 325 mg by mouth daily.         . fenofibrate 160 MG tablet   Oral   Take 1 tablet (160 mg total) by mouth daily.   30 tablet   0   . levothyroxine (SYNTHROID, LEVOTHROID) 75 MCG tablet   Oral   Take 1 tablet (75 mcg total) by mouth daily before breakfast.   30 tablet   6   . lisinopril (PRINIVIL,ZESTRIL) 5 MG tablet   Oral   Take 1 tablet (5 mg total) by mouth daily.   90 tablet   3     Please disregard previous norvasc rx   . pravastatin (PRAVACHOL) 40 MG tablet   Oral   Take 1 tablet (40 mg total) by mouth daily.   30 tablet   6   . temazepam (RESTORIL) 30 MG capsule      TAKE ONE CAPSULE BY MOUTH ONCE DAILY AT BEDTIME   30 capsule   0    BP 154/64  Pulse 60  Temp(Src) 97.6  F (36.4 C) (Oral)  Resp 20  Ht 5\' 4"  (1.626 m)  Wt 140 lb (63.504 kg)  BMI 24.02 kg/m2  SpO2 100% Physical Exam  Nursing note and vitals reviewed. Constitutional: He is oriented to person, place, and time. He appears well-developed and well-nourished.  HENT:  Head: Normocephalic and atraumatic.  Eyes: Conjunctivae and EOM are normal. Pupils are equal, round, and reactive to light.  Neck: Normal range of motion. Neck supple.  Cardiovascular: Normal rate, regular rhythm and normal heart sounds.   Pulmonary/Chest: Effort normal and breath sounds normal.  Abdominal: Soft. Bowel sounds are normal.  Musculoskeletal: Normal range of motion.  Neurological: He is alert and oriented to person, place, and time.  Skin: Skin is warm and dry.  Psychiatric: He has a normal mood and affect. His behavior is normal.    ED Course  Procedures (including critical care time) Labs Review Labs Reviewed  CBC WITH DIFFERENTIAL - Abnormal; Notable for the following:    Monocytes Relative 17 (*)    Monocytes Absolute 1.1 (*)    Eosinophils Relative  9 (*)    All other components within normal limits  BASIC METABOLIC PANEL - Abnormal; Notable for the following:    Potassium 5.9 (*)    Glucose, Bld 107 (*)    BUN 28 (*)    Creatinine, Ser 1.98 (*)    Calcium 11.0 (*)    GFR calc non Af Amer 30 (*)    GFR calc Af Amer 35 (*)    All other components within normal limits   Imaging Review No results found.  EKG Interpretation   None       MDM   Final diagnoses:  Hyperkalemia    Recheck of potassium was 5.9      this was discussed with the patient. He did not want to stay in the hospital. He was advised to discontinue his lisinopril which could be the etiology of the hyperkalemia. He will follow up with his doctor next week for a repeat of his blood pressure and potassium.    Nat Christen, MD 02/26/13 [phone]

## 2013-02-26 NOTE — ED Notes (Signed)
Pt reports had appt with [facility] and had routine blood work done 2 days ago.  Reports was called today and told to come to ED because his potassium was 6.6.  Denies any complaints.

## 2013-02-26 NOTE — Discharge Instructions (Signed)
Stop Lisinopril.   Follow up your dr next week

## 2013-02-26 NOTE — Telephone Encounter (Signed)
Patient aware of elevated potassium and will go to [facility].

## 2013-03-02 ENCOUNTER — Ambulatory Visit: Payer: Medicare HMO | Admitting: Family Medicine

## 2013-03-02 ENCOUNTER — Encounter: Payer: Self-pay | Admitting: Family Medicine

## 2013-03-02 ENCOUNTER — Ambulatory Visit (INDEPENDENT_AMBULATORY_CARE_PROVIDER_SITE_OTHER): Payer: Medicare HMO | Admitting: Family Medicine

## 2013-03-02 VITALS — BP 161/80 | HR 78 | Temp 97.6°F | Ht 63.0 in | Wt 138.8 lb

## 2013-03-02 DIAGNOSIS — E785 Hyperlipidemia, unspecified: Secondary | ICD-10-CM

## 2013-03-02 DIAGNOSIS — I10 Essential (primary) hypertension: Secondary | ICD-10-CM

## 2013-03-02 DIAGNOSIS — F411 Generalized anxiety disorder: Secondary | ICD-10-CM

## 2013-03-02 DIAGNOSIS — E039 Hypothyroidism, unspecified: Secondary | ICD-10-CM

## 2013-03-02 DIAGNOSIS — R899 Unspecified abnormal finding in specimens from other organs, systems and tissues: Secondary | ICD-10-CM

## 2013-03-02 DIAGNOSIS — R6889 Other general symptoms and signs: Secondary | ICD-10-CM

## 2013-03-02 MED ORDER — AMLODIPINE BESYLATE 5 MG PO TABS: 5.0000 mg | ORAL_TABLET | Freq: Every day | ORAL | Status: DC

## 2013-03-02 NOTE — Progress Notes (Signed)
Patient ID: Kevin Ho, male   DOB: [birth year], 80 y.o.   MRN: [id number] SUBJECTIVE: CC: Chief Complaint  Patient presents with  . Follow-up    saw dr Ernestina Patches last week follow up . wasa told to hold lisinopril      HPI: Patient is here for follow up of hypertension/Hyperkalemia/hypercalcemia: denies Headache;deniesChest Pain;denies weakness;denies Shortness of Breath or Orthopnea;denies Visual changes;denies palpitations;denies cough;denies pedal edema;denies symptoms of TIA or stroke; admits to Compliance with medications. denies Problems with medications. See ED notes and Dr Romona Curls notes Feels good now that the lisinopril was discontinued.  Past Medical History  Diagnosis Date  . Hyperlipidemia   . Hypertension   . Chronic pain   . Anxiety   . Insomnia   . Arthritis   . Cancer    Past Surgical History  Procedure Laterality Date  . Right inguinal hernia     History   Social History  . Marital Status: Widowed    Spouse Name: N/A    Number of Children: N/A  . Years of Education: N/A   Occupational History  . Not on file.   Social History Main Topics  . Smoking status: Current Every Day Smoker -- 1.00 packs/day    Types: Cigarettes  . Smokeless tobacco: Not on file  . Alcohol Use: No  . Drug Use: No  . Sexual Activity: Not on file   Other Topics Concern  . Not on file   Social History Narrative  . No narrative on file   No family history on file. Current Outpatient Prescriptions on File Prior to Visit  Medication Sig Dispense Refill  . aspirin 325 MG tablet Take 325 mg by mouth daily.      . fenofibrate 160 MG tablet Take 1 tablet (160 mg total) by mouth daily.  30 tablet  0  . levothyroxine (SYNTHROID, LEVOTHROID) 75 MCG tablet Take 1 tablet (75 mcg total) by mouth daily before breakfast.  30 tablet  6  . pravastatin (PRAVACHOL) 40 MG tablet Take 1 tablet (40 mg total) by mouth daily.  30 tablet  6  . temazepam (RESTORIL) 30 MG capsule TAKE ONE  CAPSULE BY MOUTH ONCE DAILY AT BEDTIME  30 capsule  0   No current facility-administered medications on file prior to visit.   Allergies  Allergen Reactions  . Chantix [Varenicline]    Immunization History  Administered Date(s) Administered  . Influenza, High Dose Seasonal PF 10/15/2012   Prior to Admission medications   Medication Sig Start Date End Date Taking? Authorizing Provider  aspirin 325 MG tablet Take 325 mg by mouth daily.   Yes Historical Provider, MD  fenofibrate 160 MG tablet Take 1 tablet (160 mg total) by mouth daily. 02/24/13  Yes Shanda Howells, MD  levothyroxine (SYNTHROID, LEVOTHROID) 75 MCG tablet Take 1 tablet (75 mcg total) by mouth daily before breakfast. 01/27/13  Yes Shanda Howells, MD  pravastatin (PRAVACHOL) 40 MG tablet Take 1 tablet (40 mg total) by mouth daily. 01/27/13  Yes Shanda Howells, MD  temazepam (RESTORIL) 30 MG capsule TAKE ONE CAPSULE BY MOUTH ONCE DAILY AT BEDTIME 12/16/12  Yes Mary-Margaret Hassell Done, FNP  lisinopril (PRINIVIL,ZESTRIL) 5 MG tablet Take 1 tablet (5 mg total) by mouth daily. 01/27/13   Shanda Howells, MD     ROS: As above in the HPI. All other systems are stable or negative.  OBJECTIVE: APPEARANCE:  Patient in no acute distress.The patient appeared well nourished and normally developed. Acyanotic. Waist: VITAL SIGNS:BP 161/80  Pulse 78  Temp(Src) 97.6 F (36.4 C) (Oral)  Ht 5' 3"  (1.6 m)  Wt 138 lb 12.8 oz (62.959 kg)  BMI 24.59 kg/m2  WM  SKIN: warm and  Dry without overt rashes, tattoos and scars  HEAD and Neck: without JVD, Head and scalp: normal Eyes:No scleral icterus. Fundi normal, eye movements normal. Ears: Auricle normal, canal normal, Tympanic membranes normal, insufflation normal. Nose: normal Throat: normal Neck & thyroid: normal  CHEST & LUNGS: Chest wall: normal Lungs: Clear  CVS: Reveals the PMI to be normally located. Regular rhythm, First and Second Heart sounds are normal,  absence of murmurs,  rubs or gallops. Peripheral vasculature: Radial pulses: normal Dorsal pedis pulses: normal Posterior pulses: normal  ABDOMEN:  Appearance: normal Benign, no organomegaly, no masses, no Abdominal Aortic enlargement. No Guarding , no rebound. No Bruits. Bowel sounds: normal  RECTAL: N/A GU: N/A  EXTREMETIES: nonedematous.  MUSCULOSKELETAL:  Spine: normal Joints: intact  NEUROLOGIC: oriented to time,place and person; nonfocal. Results for orders placed during the hospital encounter of 02/26/13  CBC WITH DIFFERENTIAL      Result Value Ref Range   WBC 6.3  4.0 - 10.5 K/uL   RBC 4.26  4.22 - 5.81 MIL/uL   Hemoglobin 13.6  13.0 - 17.0 g/dL   HCT 40.1  39.0 - 52.0 %   MCV 94.1  78.0 - 100.0 fL   MCH 31.9  26.0 - 34.0 pg   MCHC 33.9  30.0 - 36.0 g/dL   RDW 12.8  11.5 - 15.5 %   Platelets 207  150 - 400 K/uL   Neutrophils Relative % 55  43 - 77 %   Neutro Abs 3.5  1.7 - 7.7 K/uL   Lymphocytes Relative 19  12 - 46 %   Lymphs Abs 1.2  0.7 - 4.0 K/uL   Monocytes Relative 17 (*) 3 - 12 %   Monocytes Absolute 1.1 (*) 0.1 - 1.0 K/uL   Eosinophils Relative 9 (*) 0 - 5 %   Eosinophils Absolute 0.6  0.0 - 0.7 K/uL   Basophils Relative 1  0 - 1 %   Basophils Absolute 0.1  0.0 - 0.1 K/uL  BASIC METABOLIC PANEL      Result Value Ref Range   Sodium 141  137 - 147 mEq/L   Potassium 5.9 (*) 3.7 - 5.3 mEq/L   Chloride 101  96 - 112 mEq/L   CO2 28  19 - 32 mEq/L   Glucose, Bld 107 (*) 70 - 99 mg/dL   BUN 28 (*) 6 - 23 mg/dL   Creatinine, Ser 1.98 (*) 0.50 - 1.35 mg/dL   Calcium 11.0 (*) 8.4 - 10.5 mg/dL   GFR calc non Af Amer 30 (*) >90 mL/min   GFR calc Af Amer 35 (*) >90 mL/min    ASSESSMENT: Other and unspecified hyperlipidemia  Anxiety state, unspecified  Hypothyroidism  HTN (hypertension) - Plan: amLODipine (NORVASC) 5 MG tablet  Hypercalcemia - Plan: PTH, Intact and Calcium  Abnormal laboratory test - Plan: BMP8+EGFR, PTH, Intact and Calcium   BP went  higher.  PLAN: DASH diet in the avs. Restrict Potassium containing foods and vitamins.  Orders Placed This Encounter  Procedures  . BMP8+EGFR    Order Specific Question:  Has the patient fasted?    Answer:  No  . PTH, Intact and Calcium   Meds ordered this encounter  Medications  . amLODipine (NORVASC) 5 MG tablet    Sig: Take 1  tablet (5 mg total) by mouth daily.    Dispense:  30 tablet    Refill:  3   Medications Discontinued During This Encounter  Medication Reason  . lisinopril (PRINIVIL,ZESTRIL) 5 MG tablet Discontinued by provider   Return in about 1 week (around 03/09/2013) for recheck BP, Recheck medical problems.  Kaylianna Detert P. Jacelyn Grip, M.D.

## 2013-03-02 NOTE — Patient Instructions (Signed)
DASH Diet  The DASH diet stands for "Dietary Approaches to Stop Hypertension." It is a healthy eating plan that has been shown to reduce high blood pressure (hypertension) in as little as 14 days, while also possibly providing other significant health benefits. These other health benefits include reducing the risk of breast cancer after menopause and reducing the risk of type 2 diabetes, heart disease, colon cancer, and stroke. Health benefits also include weight loss and slowing kidney failure in patients with chronic kidney disease.   DIET GUIDELINES  · Limit salt (sodium). Your diet should contain less than 1500 mg of sodium daily.  · Limit refined or processed carbohydrates. Your diet should include mostly whole grains. Desserts and added sugars should be used sparingly.  · Include small amounts of heart-healthy fats. These types of fats include nuts, oils, and tub margarine. Limit saturated and trans fats. These fats have been shown to be harmful in the body.  CHOOSING FOODS   The following food groups are based on a 2000 calorie diet. See your Registered Dietitian for individual calorie needs.  Grains and Grain Products (6 to 8 servings daily)  · Eat More Often: Whole-wheat bread, brown rice, whole-grain or wheat pasta, quinoa, popcorn without added fat or salt (air popped).  · Eat Less Often: White bread, white pasta, white rice, cornbread.  Vegetables (4 to 5 servings daily)  · Eat More Often: Fresh, frozen, and canned vegetables. Vegetables may be raw, steamed, roasted, or grilled with a minimal amount of fat.  · Eat Less Often/Avoid: Creamed or fried vegetables. Vegetables in a cheese sauce.  Fruit (4 to 5 servings daily)  · Eat More Often: All fresh, canned (in natural juice), or frozen fruits. Dried fruits without added sugar. One hundred percent fruit juice (½ cup [237 mL] daily).  · Eat Less Often: Dried fruits with added sugar. Canned fruit in light or heavy syrup.  Lean Meats, Fish, and Poultry (2  servings or less daily. One serving is 3 to 4 oz [85-114 g]).  · Eat More Often: Ninety percent or leaner ground beef, tenderloin, sirloin. Round cuts of beef, chicken breast, turkey breast. All fish. Grill, bake, or broil your meat. Nothing should be fried.  · Eat Less Often/Avoid: Fatty cuts of meat, turkey, or chicken leg, thigh, or wing. Fried cuts of meat or fish.  Dairy (2 to 3 servings)  · Eat More Often: Low-fat or fat-free milk, low-fat plain or light yogurt, reduced-fat or part-skim cheese.  · Eat Less Often/Avoid: Milk (whole, 2%). Whole milk yogurt. Full-fat cheeses.  Nuts, Seeds, and Legumes (4 to 5 servings per week)  · Eat More Often: All without added salt.  · Eat Less Often/Avoid: Salted nuts and seeds, canned beans with added salt.  Fats and Sweets (limited)  · Eat More Often: Vegetable oils, tub margarines without trans fats, sugar-free gelatin. Mayonnaise and salad dressings.  · Eat Less Often/Avoid: Coconut oils, palm oils, butter, stick margarine, cream, half and half, cookies, candy, pie.  FOR MORE INFORMATION  The Dash Diet Eating Plan: www.dashdiet.org  Document Released: 12/21/2010 Document Revised: 03/26/2011 Document Reviewed: 12/21/2010  [employer]® Patient Information ©2014 [employer].

## 2013-03-03 ENCOUNTER — Other Ambulatory Visit: Payer: Self-pay | Admitting: Family Medicine

## 2013-03-03 DIAGNOSIS — E875 Hyperkalemia: Secondary | ICD-10-CM

## 2013-03-03 LAB — BMP8+EGFR
BUN/Creatinine Ratio: 12 (ref 10–22)
BUN: 21 mg/dL (ref 8–27)
CO2: 27 mmol/L (ref 18–29)
Calcium: 11.6 mg/dL — ABNORMAL HIGH (ref 8.6–10.2)
Chloride: 100 mmol/L (ref 97–108)
Creatinine, Ser: 1.77 mg/dL — ABNORMAL HIGH (ref 0.76–1.27)
GFR calc Af Amer: 41 mL/min/{1.73_m2} — ABNORMAL LOW (ref >59)
GFR calc non Af Amer: 36 mL/min/{1.73_m2} — ABNORMAL LOW (ref >59)
Glucose: 93 mg/dL (ref 65–99)
Potassium: 5.9 mmol/L (ref 3.5–5.2)
Sodium: 142 mmol/L (ref 134–144)

## 2013-03-03 LAB — PTH, INTACT AND CALCIUM: PTH: 17 pg/mL (ref 15–65)

## 2013-03-03 MED ORDER — SODIUM POLYSTYRENE SULFONATE 15 GM/60ML PO SUSP: 15.0000 g | Freq: Once | ORAL | Status: DC

## 2013-03-13 ENCOUNTER — Ambulatory Visit (INDEPENDENT_AMBULATORY_CARE_PROVIDER_SITE_OTHER): Payer: Medicare HMO | Admitting: Family Medicine

## 2013-03-13 ENCOUNTER — Encounter: Payer: Self-pay | Admitting: Family Medicine

## 2013-03-13 VITALS — BP 142/66 | HR 94 | Temp 97.1°F | Ht 63.0 in | Wt 138.0 lb

## 2013-03-13 DIAGNOSIS — I10 Essential (primary) hypertension: Secondary | ICD-10-CM

## 2013-03-13 DIAGNOSIS — E785 Hyperlipidemia, unspecified: Secondary | ICD-10-CM

## 2013-03-13 DIAGNOSIS — E875 Hyperkalemia: Secondary | ICD-10-CM | POA: Insufficient documentation

## 2013-03-13 DIAGNOSIS — Z720 Tobacco use: Secondary | ICD-10-CM | POA: Insufficient documentation

## 2013-03-13 DIAGNOSIS — E039 Hypothyroidism, unspecified: Secondary | ICD-10-CM

## 2013-03-13 DIAGNOSIS — F17200 Nicotine dependence, unspecified, uncomplicated: Secondary | ICD-10-CM

## 2013-03-13 NOTE — Progress Notes (Signed)
Patient ID: Kevin Ho, male   DOB: Mar 17, 1931, 80 y.o.   MRN: [id number] SUBJECTIVE: CC: Chief Complaint  Patient presents with  . Follow-up    follow up K+. took kayelate for 2 doses     HPI:  Feels fine. Still smoking.  Patient is here for follow up of hypertension/Hyperkalemia/hypercalcemia: denies Headache;deniesChest Pain;denies weakness;denies Shortness of Breath or Orthopnea;denies Visual changes;denies palpitations;denies cough;denies pedal edema;denies symptoms of TIA or stroke; admits to Compliance with medications. denies Problems with medications.  Past Medical History  Diagnosis Date  . Hyperlipidemia   . Hypertension   . Chronic pain   . Anxiety   . Insomnia   . Arthritis   . Cancer    Past Surgical History  Procedure Laterality Date  . Right inguinal hernia     History   Social History  . Marital Status: Widowed    Spouse Name: N/A    Number of Children: N/A  . Years of Education: N/A   Occupational History  . Not on file.   Social History Main Topics  . Smoking status: Current Every Day Smoker -- 1.00 packs/day    Types: Cigarettes  . Smokeless tobacco: Not on file  . Alcohol Use: No  . Drug Use: No  . Sexual Activity: Not on file   Other Topics Concern  . Not on file   Social History Narrative  . No narrative on file   No family history on file. Current Outpatient Prescriptions on File Prior to Visit  Medication Sig Dispense Refill  . amLODipine (NORVASC) 5 MG tablet Take 1 tablet (5 mg total) by mouth daily.  30 tablet  3  . aspirin 325 MG tablet Take 325 mg by mouth daily.      . fenofibrate 160 MG tablet Take 1 tablet (160 mg total) by mouth daily.  30 tablet  0  . levothyroxine (SYNTHROID, LEVOTHROID) 75 MCG tablet Take 1 tablet (75 mcg total) by mouth daily before breakfast.  30 tablet  6  . pravastatin (PRAVACHOL) 40 MG tablet Take 1 tablet (40 mg total) by mouth daily.  30 tablet  6  . temazepam (RESTORIL) 30 MG capsule  TAKE ONE CAPSULE BY MOUTH ONCE DAILY AT BEDTIME  30 capsule  0  . sodium polystyrene (KAYEXALATE) 15 GM/60ML suspension Take 60 mLs (15 g total) by mouth once. Daily for 2 days  500 mL  0   No current facility-administered medications on file prior to visit.   Allergies  Allergen Reactions  . Chantix [Varenicline]    Immunization History  Administered Date(s) Administered  . Influenza, High Dose Seasonal PF 10/15/2012   Prior to Admission medications   Medication Sig Start Date End Date Taking? Authorizing Provider  amLODipine (NORVASC) 5 MG tablet Take 1 tablet (5 mg total) by mouth daily. 03/02/13  Yes Vernie Shanks, MD  aspirin 325 MG tablet Take 325 mg by mouth daily.   Yes Historical Provider, MD  fenofibrate 160 MG tablet Take 1 tablet (160 mg total) by mouth daily. 02/24/13  Yes Shanda Howells, MD  levothyroxine (SYNTHROID, LEVOTHROID) 75 MCG tablet Take 1 tablet (75 mcg total) by mouth daily before breakfast. 01/27/13  Yes Shanda Howells, MD  pravastatin (PRAVACHOL) 40 MG tablet Take 1 tablet (40 mg total) by mouth daily. 01/27/13  Yes Shanda Howells, MD  temazepam (RESTORIL) 30 MG capsule TAKE ONE CAPSULE BY MOUTH ONCE DAILY AT BEDTIME 12/16/12  Yes Mary-Margaret Hassell Done, FNP  sodium polystyrene (KAYEXALATE) 15  GM/60ML suspension Take 60 mLs (15 g total) by mouth once. Daily for 2 days 03/03/13   Vernie Shanks, MD     ROS: As above in the HPI. All other systems are stable or negative.  OBJECTIVE: APPEARANCE:  Patient in no acute distress.The patient appeared well nourished and normally developed. Acyanotic. Waist: VITAL SIGNS:BP 142/66  Pulse 94  Temp(Src) 97.1 F (36.2 C) (Oral)  Ht 5' 3" (1.6 m)  Wt 138 lb (62.596 kg)  BMI 24.45 kg/m2  WM smells of tobacco smoke.  SKIN: warm and  Dry without overt rashes, tattoos and scars  HEAD and Neck: without JVD, Head and scalp: normal Eyes:No scleral icterus. Fundi normal, eye movements normal. Ears: Auricle normal, canal  normal, Tympanic membranes normal, insufflation normal. Nose: normal Throat: normal Neck & thyroid: normal  CHEST & LUNGS: Chest wall: normal Lungs: Clear  CVS: Reveals the PMI to be normally located. Regular rhythm, First and Second Heart sounds are normal,  absence of murmurs, rubs or gallops. Peripheral vasculature: Radial pulses: normal Dorsal pedis pulses: normal Posterior pulses: normal  ABDOMEN:  Appearance: normal Benign, no organomegaly, no masses, no Abdominal Aortic enlargement. No Guarding , no rebound. No Bruits. Bowel sounds: normal  RECTAL: N/A GU: N/A  EXTREMETIES: nonedematous.  MUSCULOSKELETAL:  Spine: normal Joints: intact  NEUROLOGIC: oriented to time,place and person; nonfocal. Results for orders placed in visit on 03/02/13  [facility]      Result Value Ref Range   Glucose 93  65 - 99 mg/dL   BUN 21  8 - 27 mg/dL   Creatinine, Ser 1.77 (*) 0.76 - 1.27 mg/dL   GFR calc non Af Amer 36 (*) >59 mL/min/1.73   GFR calc Af Amer 41 (*) >59 mL/min/1.73   BUN/Creatinine Ratio 12  10 - 22   Sodium 142  134 - 144 mmol/L   Potassium 5.9 (*) 3.5 - 5.2 mmol/L   Chloride 100  97 - 108 mmol/L   CO2 27  18 - 29 mmol/L   Calcium 11.6 (*) 8.6 - 10.2 mg/dL  PTH, INTACT AND CALCIUM      Result Value Ref Range   PTH 17  15 - 65 pg/mL   PTH Comment      ASSESSMENT:  Other and unspecified hyperlipidemia  HTN (hypertension)  Hypothyroidism  Hyperkalemia - Plan: BMP8+EGFR  Hypercalcemia - Plan: BMP8+EGFR  Tobacco user Post kayexelate 2 doses  PLAN:  Orders Placed This Encounter  Procedures  . BMP8+EGFR  await labs to see if the calcium and potassium is corrected. Smoking cessation counselled for 7 minutes. Handouts in AVS No orders of the defined types were placed in this encounter.   There are no discontinued medications. Return in about 6 weeks (around 04/24/2013) for Recheck medical problems.   P. Jacelyn Grip, M.D.

## 2013-03-13 NOTE — Patient Instructions (Signed)
Smoking Cessation Quitting smoking is important to your health and has many advantages. However, it is not always easy to quit since nicotine is a very addictive drug. Often times, people try 3 times or more before being able to quit. This document explains the best ways for you to prepare to quit smoking. Quitting takes hard work and a lot of effort, but you can do it. ADVANTAGES OF QUITTING SMOKING  You will live longer, feel better, and live better.  Your body will feel the impact of quitting smoking almost immediately.  Within 20 minutes, blood pressure decreases. Your pulse returns to its normal level.  After 8 hours, carbon monoxide levels in the blood return to normal. Your oxygen level increases.  After 24 hours, the chance of having a heart attack starts to decrease. Your breath, hair, and body stop smelling like smoke.  After 48 hours, damaged nerve endings begin to recover. Your sense of taste and smell improve.  After 72 hours, the body is virtually free of nicotine. Your bronchial tubes relax and breathing becomes easier.  After 2 to 12 weeks, lungs can hold more air. Exercise becomes easier and circulation improves.  The risk of having a heart attack, stroke, cancer, or lung disease is greatly reduced.  After 1 year, the risk of coronary heart disease is cut in half.  After 5 years, the risk of stroke falls to the same as a nonsmoker.  After 10 years, the risk of lung cancer is cut in half and the risk of other cancers decreases significantly.  After 15 years, the risk of coronary heart disease drops, usually to the level of a nonsmoker.  If you are pregnant, quitting smoking will improve your chances of having a healthy baby.  The people you live with, especially any children, will be healthier.  You will have extra money to spend on things other than cigarettes. QUESTIONS TO THINK ABOUT BEFORE ATTEMPTING TO QUIT You may want to talk about your answers with your  caregiver.  Why do you want to quit?  If you tried to quit in the past, what helped and what did not?  What will be the most difficult situations for you after you quit? How will you plan to handle them?  Who can help you through the tough times? Your family? Friends? A caregiver?  What pleasures do you get from smoking? What ways can you still get pleasure if you quit? Here are some questions to ask your caregiver:  How can you help me to be successful at quitting?  What medicine do you think would be best for me and how should I take it?  What should I do if I need more help?  What is smoking withdrawal like? How can I get information on withdrawal? GET READY  Set a quit date.  Change your environment by getting rid of all cigarettes, ashtrays, matches, and lighters in your home, car, or work. Do not let people smoke in your home.  Review your past attempts to quit. Think about what worked and what did not. GET SUPPORT AND ENCOURAGEMENT You have a better chance of being successful if you have help. You can get support in many ways.  Tell your family, friends, and co-workers that you are going to quit and need their support. Ask them not to smoke around you.  Get individual, group, or telephone counseling and support. Programs are available at local hospitals and health centers. Call your local health department for   information about programs in your area.  Spiritual beliefs and practices may help some smokers quit.  Download a "quit meter" on your computer to keep track of quit statistics, such as how long you have gone without smoking, cigarettes not smoked, and money saved.  Get a self-help book about quitting smoking and staying off of tobacco. LEARN NEW SKILLS AND BEHAVIORS  Distract yourself from urges to smoke. Talk to someone, go for a walk, or occupy your time with a task.  Change your normal routine. Take a different route to work. Drink tea instead of coffee.  Eat breakfast in a different place.  Reduce your stress. Take a hot bath, exercise, or read a book.  Plan something enjoyable to do every day. Reward yourself for not smoking.  Explore interactive web-based programs that specialize in helping you quit. GET MEDICINE AND USE IT CORRECTLY Medicines can help you stop smoking and decrease the urge to smoke. Combining medicine with the above behavioral methods and support can greatly increase your chances of successfully quitting smoking.  Nicotine replacement therapy helps deliver nicotine to your body without the negative effects and risks of smoking. Nicotine replacement therapy includes nicotine gum, lozenges, inhalers, nasal sprays, and skin patches. Some may be available over-the-counter and others require a prescription.  Antidepressant medicine helps people abstain from smoking, but how this works is unknown. This medicine is available by prescription.  Nicotinic receptor partial agonist medicine simulates the effect of nicotine in your brain. This medicine is available by prescription. Ask your caregiver for advice about which medicines to use and how to use them based on your health history. Your caregiver will tell you what side effects to look out for if you choose to be on a medicine or therapy. Carefully read the information on the package. Do not use any other product containing nicotine while using a nicotine replacement product.  RELAPSE OR DIFFICULT SITUATIONS Most relapses occur within the first 3 months after quitting. Do not be discouraged if you start smoking again. Remember, most people try several times before finally quitting. You may have symptoms of withdrawal because your body is used to nicotine. You may crave cigarettes, be irritable, feel very hungry, cough often, get headaches, or have difficulty concentrating. The withdrawal symptoms are only temporary. They are strongest when you first quit, but they will go away within  10 14 days. To reduce the chances of relapse, try to:  Avoid drinking alcohol. Drinking lowers your chances of successfully quitting.  Reduce the amount of caffeine you consume. Once you quit smoking, the amount of caffeine in your body increases and can give you symptoms, such as a rapid heartbeat, sweating, and anxiety.  Avoid smokers because they can make you want to smoke.  Do not let weight gain distract you. Many smokers will gain weight when they quit, usually less than 10 pounds. Eat a healthy diet and stay active. You can always lose the weight gained after you quit.  Find ways to improve your mood other than smoking. FOR MORE INFORMATION  www.smokefree.gov  Document Released: 12/26/2000 Document Revised: 07/03/2011 Document Reviewed: 04/12/2011 [employer] Patient Information 2014 [employer].  

## 2013-03-14 ENCOUNTER — Other Ambulatory Visit: Payer: Self-pay | Admitting: Family Medicine

## 2013-03-14 DIAGNOSIS — N183 Chronic kidney disease, stage 3 unspecified: Secondary | ICD-10-CM

## 2013-03-14 LAB — BMP8+EGFR
BUN/Creatinine Ratio: 10 (ref 10–22)
BUN: 19 mg/dL (ref 8–27)
CO2: 26 mmol/L (ref 18–29)
Calcium: 11 mg/dL — ABNORMAL HIGH (ref 8.6–10.2)
Chloride: 101 mmol/L (ref 97–108)
Creatinine, Ser: 1.98 mg/dL — ABNORMAL HIGH (ref 0.76–1.27)
GFR calc Af Amer: 36 mL/min/{1.73_m2} — ABNORMAL LOW (ref >59)
GFR calc non Af Amer: 31 mL/min/{1.73_m2} — ABNORMAL LOW (ref >59)
Glucose: 106 mg/dL — ABNORMAL HIGH (ref 65–99)
Potassium: 5.2 mmol/L (ref 3.5–5.2)
Sodium: 143 mmol/L (ref 134–144)

## 2013-03-14 NOTE — Progress Notes (Signed)
Quick Note:  Call Patient Labs that are abnormal: Calcium still elevated and the chronic kidney disease is Stable. Recommendations: Will need renal specialists evaluation. Will refer in [facility]   ______

## 2013-03-16 ENCOUNTER — Telehealth: Payer: Self-pay | Admitting: Family Medicine

## 2013-03-16 NOTE — Telephone Encounter (Signed)
Message copied by Waverly Ferrari on Mon Mar 16, 2013 12:11 PM ------      Message from: Vernie Shanks      Created: Sat Mar 14, 2013 11:33 AM       Call Patient      Labs that are abnormal:      Calcium still elevated and the chronic kidney disease is  Stable.      Recommendations:      Will need renal specialists evaluation.      Will refer in EPIC             ------

## 2013-03-19 ENCOUNTER — Other Ambulatory Visit: Payer: Self-pay

## 2013-03-19 MED ORDER — TEMAZEPAM 30 MG PO CAPS: 30.0000 mg | ORAL_CAPSULE | Freq: Every day | ORAL | Status: DC

## 2013-03-19 NOTE — Telephone Encounter (Signed)
Last seen 03/13/13  FPW  If approved route to nurse to call into [facility]

## 2013-03-19 NOTE — Telephone Encounter (Signed)
Rx ready for nurse to Phone in. 

## 2013-03-20 ENCOUNTER — Other Ambulatory Visit: Payer: Self-pay | Admitting: *Deleted

## 2013-03-20 DIAGNOSIS — E785 Hyperlipidemia, unspecified: Secondary | ICD-10-CM

## 2013-03-20 MED ORDER — FENOFIBRATE 160 MG PO TABS: 160.0000 mg | ORAL_TABLET | Freq: Every day | ORAL | Status: DC

## 2013-03-20 NOTE — Telephone Encounter (Signed)
Called in.

## 2013-03-26 ENCOUNTER — Other Ambulatory Visit: Payer: Self-pay | Admitting: *Deleted

## 2013-03-26 MED ORDER — BUSPIRONE HCL 5 MG PO TABS: 5.0000 mg | ORAL_TABLET | Freq: Three times a day (TID) | ORAL | Status: DC

## 2013-03-26 NOTE — Telephone Encounter (Signed)
I do not see this on med list?

## 2013-03-26 NOTE — Telephone Encounter (Signed)
Call patient : Prescription refilled & sent to pharmacy in [facility]. 

## 2013-04-02 ENCOUNTER — Encounter: Payer: Self-pay | Admitting: Family Medicine

## 2013-04-02 ENCOUNTER — Ambulatory Visit (INDEPENDENT_AMBULATORY_CARE_PROVIDER_SITE_OTHER): Payer: Medicare HMO | Admitting: Family Medicine

## 2013-04-02 VITALS — BP 150/79 | HR 65 | Temp 97.1°F | Ht 63.0 in | Wt 139.0 lb

## 2013-04-02 DIAGNOSIS — E039 Hypothyroidism, unspecified: Secondary | ICD-10-CM

## 2013-04-02 DIAGNOSIS — E875 Hyperkalemia: Secondary | ICD-10-CM

## 2013-04-02 DIAGNOSIS — F17200 Nicotine dependence, unspecified, uncomplicated: Secondary | ICD-10-CM

## 2013-04-02 DIAGNOSIS — Z720 Tobacco use: Secondary | ICD-10-CM

## 2013-04-02 DIAGNOSIS — F411 Generalized anxiety disorder: Secondary | ICD-10-CM

## 2013-04-02 DIAGNOSIS — E785 Hyperlipidemia, unspecified: Secondary | ICD-10-CM

## 2013-04-02 DIAGNOSIS — I10 Essential (primary) hypertension: Secondary | ICD-10-CM

## 2013-04-02 DIAGNOSIS — R42 Dizziness and giddiness: Secondary | ICD-10-CM | POA: Insufficient documentation

## 2013-04-02 NOTE — Progress Notes (Signed)
Patient ID: Kevin Ho, male   DOB: 02-28-1931, 80 y.o.   MRN: [id number] SUBJECTIVE: CC: Chief Complaint  Patient presents with  . Follow-up    says a medication is making him dizzy.    HPI: Patient is here for follow up of hypertension: denies Headache;deniesChest Pain;denies weakness;denies Shortness of Breath or Orthopnea;denies Visual changes;denies palpitations;denies cough;denies pedal edema;denies symptoms of TIA or stroke; Gets feeling great, 1/2 hour after taking his BP meds he gets dizzy and light headed. admits to Compliance with medications. denies Problems with medications.   Past Medical History  Diagnosis Date  . Hyperlipidemia   . Hypertension   . Chronic pain   . Anxiety   . Insomnia   . Arthritis   . Cancer    Past Surgical History  Procedure Laterality Date  . Right inguinal hernia     History   Social History  . Marital Status: Widowed    Spouse Name: N/A    Number of Children: N/A  . Years of Education: N/A   Occupational History  . Not on file.   Social History Main Topics  . Smoking status: Current Every Day Smoker -- 1.00 packs/day    Types: Cigarettes  . Smokeless tobacco: Not on file  . Alcohol Use: No  . Drug Use: No  . Sexual Activity: Not on file   Other Topics Concern  . Not on file   Social History Narrative  . No narrative on file   No family history on file. Current Outpatient Prescriptions on File Prior to Visit  Medication Sig Dispense Refill  . amLODipine (NORVASC) 5 MG tablet Take 1 tablet (5 mg total) by mouth daily.  30 tablet  3  . aspirin 325 MG tablet Take 325 mg by mouth daily.      . busPIRone (BUSPAR) 5 MG tablet Take 1 tablet (5 mg total) by mouth 3 (three) times daily.  90 tablet  1  . fenofibrate 160 MG tablet Take 1 tablet (160 mg total) by mouth daily.  30 tablet  4  . levothyroxine (SYNTHROID, LEVOTHROID) 75 MCG tablet Take 1 tablet (75 mcg total) by mouth daily before breakfast.  30 tablet  6  .  pravastatin (PRAVACHOL) 40 MG tablet Take 1 tablet (40 mg total) by mouth daily.  30 tablet  6  . temazepam (RESTORIL) 30 MG capsule Take 1 capsule (30 mg total) by mouth at bedtime.  30 capsule  1  . sodium polystyrene (KAYEXALATE) 15 GM/60ML suspension Take 60 mLs (15 g total) by mouth once. Daily for 2 days  500 mL  0   No current facility-administered medications on file prior to visit.   Allergies  Allergen Reactions  . Chantix [Varenicline]    Immunization History  Administered Date(s) Administered  . Influenza, High Dose Seasonal PF 10/15/2012   Prior to Admission medications   Medication Sig Start Date End Date Taking? Authorizing Provider  amLODipine (NORVASC) 5 MG tablet Take 1 tablet (5 mg total) by mouth daily. 03/02/13  Yes Vernie Shanks, MD  aspirin 325 MG tablet Take 325 mg by mouth daily.   Yes Historical Provider, MD  busPIRone (BUSPAR) 5 MG tablet Take 1 tablet (5 mg total) by mouth 3 (three) times daily. 03/26/13  Yes Vernie Shanks, MD  fenofibrate 160 MG tablet Take 1 tablet (160 mg total) by mouth daily. 03/20/13  Yes Shanda Howells, MD  levothyroxine (SYNTHROID, LEVOTHROID) 75 MCG tablet Take 1 tablet (75 mcg  total) by mouth daily before breakfast. 01/27/13  Yes Shanda Howells, MD  lisinopril (PRINIVIL,ZESTRIL) 5 MG tablet  01/27/13  Yes Historical Provider, MD  pravastatin (PRAVACHOL) 40 MG tablet Take 1 tablet (40 mg total) by mouth daily. 01/27/13  Yes Shanda Howells, MD  temazepam (RESTORIL) 30 MG capsule Take 1 capsule (30 mg total) by mouth at bedtime. 03/19/13  Yes Vernie Shanks, MD  sodium polystyrene (KAYEXALATE) 15 GM/60ML suspension Take 60 mLs (15 g total) by mouth once. Daily for 2 days 03/03/13   Vernie Shanks, MD     ROS: As above in the HPI. All other systems are stable or negative.  OBJECTIVE: APPEARANCE:  Patient in no acute distress.The patient appeared well nourished and normally developed. Acyanotic. Waist: VITAL SIGNS:BP 150/79  Pulse 65   Temp(Src) 97.1 F (36.2 C) (Oral)  Ht 5' 3"  (1.6 m)  Wt 139 lb (63.05 kg)  BMI 24.63 kg/m2 WM Supine BP 145/80 Standing BP 120/75 Patient has orthostatic symptoms.  SKIN: warm and  Dry without overt rashes, tattoos and scars  HEAD and Neck: without JVD, Head and scalp: normal Eyes:No scleral icterus. Fundi normal, eye movements normal. Ears: Auricle normal, canal normal, Tympanic membranes normal, insufflation normal. Nose: normal Throat: normal Neck & thyroid: normal  CHEST & LUNGS: Chest wall: normal Lungs: Clear  CVS: Reveals the PMI to be normally located. Regular rhythm, First and Second Heart sounds are normal,  absence of murmurs, rubs or gallops. Peripheral vasculature: Radial pulses: normal Dorsal pedis pulses: normal Posterior pulses: normal  ABDOMEN:  Appearance: normal Benign, no organomegaly, no masses, no Abdominal Aortic enlargement. No Guarding , no rebound. No Bruits. Bowel sounds: normal  RECTAL: N/A GU: N/A  EXTREMETIES: nonedematous.  MUSCULOSKELETAL:  Spine: normal Joints: intact  NEUROLOGIC: oriented to time,place and person; nonfocal. Strength is normal Sensory is normal Reflexes are normal Cranial Nerves are normal. Results for orders placed in visit on 03/13/13  [facility]      Result Value Ref Range   Glucose 106 (*) 65 - 99 mg/dL   BUN 19  8 - 27 mg/dL   Creatinine, Ser 1.98 (*) 0.76 - 1.27 mg/dL   GFR calc non Af Amer 31 (*) >59 mL/min/1.73   GFR calc Af Amer 36 (*) >59 mL/min/1.73   BUN/Creatinine Ratio 10  10 - 22   Sodium 143  134 - 144 mmol/L   Potassium 5.2  3.5 - 5.2 mmol/L   Chloride 101  97 - 108 mmol/L   CO2 26  18 - 29 mmol/L   Calcium 11.0 (*) 8.6 - 10.2 mg/dL    ASSESSMENT:  Orthostatic dizziness  Tobacco user  Other and unspecified hyperlipidemia  Hyperkalemia  Hypercalcemia  HTN (hypertension)  Hypothyroidism  Anxiety state, unspecified  PLAN: Dash diet  Smoking cessation  counselling Stop lisinopril Take the amlodipine at night time. Advised renal consult, he declined. No orders of the defined types were placed in this encounter.   Meds ordered this encounter  Medications  . DISCONTD: lisinopril (PRINIVIL,ZESTRIL) 5 MG tablet    Sig:    Medications Discontinued During This Encounter  Medication Reason  . lisinopril (PRINIVIL,ZESTRIL) 5 MG tablet Change in therapy   Return in about 2 weeks (around 04/16/2013) for recheck BP.  Tyreona Panjwani P. Jacelyn Grip, M.D.

## 2013-04-02 NOTE — Patient Instructions (Signed)
Stop the lisinopril! Take the amlodipine at nights, before going to bed

## 2013-04-24 ENCOUNTER — Ambulatory Visit (INDEPENDENT_AMBULATORY_CARE_PROVIDER_SITE_OTHER): Payer: Medicare HMO | Admitting: Family Medicine

## 2013-04-24 ENCOUNTER — Encounter: Payer: Self-pay | Admitting: Family Medicine

## 2013-04-24 VITALS — BP 131/71 | HR 72 | Temp 98.0°F | Ht 63.0 in | Wt 138.6 lb

## 2013-04-24 DIAGNOSIS — C801 Malignant (primary) neoplasm, unspecified: Secondary | ICD-10-CM | POA: Insufficient documentation

## 2013-04-24 DIAGNOSIS — N183 Chronic kidney disease, stage 3 unspecified: Secondary | ICD-10-CM | POA: Insufficient documentation

## 2013-04-24 DIAGNOSIS — M129 Arthropathy, unspecified: Secondary | ICD-10-CM

## 2013-04-24 DIAGNOSIS — E785 Hyperlipidemia, unspecified: Secondary | ICD-10-CM

## 2013-04-24 DIAGNOSIS — F411 Generalized anxiety disorder: Secondary | ICD-10-CM

## 2013-04-24 DIAGNOSIS — F419 Anxiety disorder, unspecified: Secondary | ICD-10-CM | POA: Insufficient documentation

## 2013-04-24 DIAGNOSIS — M1990 Unspecified osteoarthritis, unspecified site: Secondary | ICD-10-CM | POA: Insufficient documentation

## 2013-04-24 DIAGNOSIS — E039 Hypothyroidism, unspecified: Secondary | ICD-10-CM

## 2013-04-24 DIAGNOSIS — F17200 Nicotine dependence, unspecified, uncomplicated: Secondary | ICD-10-CM

## 2013-04-24 DIAGNOSIS — I10 Essential (primary) hypertension: Secondary | ICD-10-CM

## 2013-04-24 DIAGNOSIS — E875 Hyperkalemia: Secondary | ICD-10-CM

## 2013-04-24 DIAGNOSIS — Z720 Tobacco use: Secondary | ICD-10-CM

## 2013-04-24 DIAGNOSIS — I8390 Asymptomatic varicose veins of unspecified lower extremity: Secondary | ICD-10-CM | POA: Insufficient documentation

## 2013-04-24 LAB — POCT URINALYSIS DIPSTICK
Bilirubin, UA: NEGATIVE
Blood, UA: NEGATIVE
Glucose, UA: NEGATIVE
Ketones, UA: NEGATIVE
Leukocytes, UA: NEGATIVE
Nitrite, UA: NEGATIVE
Protein, UA: NEGATIVE
Spec Grav, UA: 1.015
Urobilinogen, UA: NEGATIVE
pH, UA: 7

## 2013-04-24 LAB — POCT UA - MICROSCOPIC ONLY
Bacteria, U Microscopic: NEGATIVE
Casts, Ur, LPF, POC: NEGATIVE
Crystals, Ur, HPF, POC: NEGATIVE
Mucus, UA: NEGATIVE
RBC, urine, microscopic: NEGATIVE
WBC, Ur, HPF, POC: NEGATIVE

## 2013-04-24 NOTE — Patient Instructions (Addendum)
Smoking Cessation Quitting smoking is important to your health and has many advantages. However, it is not always easy to quit since nicotine is a very addictive drug. Often times, people try 3 times or more before being able to quit. This document explains the best ways for you to prepare to quit smoking. Quitting takes hard work and a lot of effort, but you can do it. ADVANTAGES OF QUITTING SMOKING  You will live longer, feel better, and live better.  Your body will feel the impact of quitting smoking almost immediately.  Within 20 minutes, blood pressure decreases. Your pulse returns to its normal level.  After 8 hours, carbon monoxide levels in the blood return to normal. Your oxygen level increases.  After 24 hours, the chance of having a heart attack starts to decrease. Your breath, hair, and body stop smelling like smoke.  After 48 hours, damaged nerve endings begin to recover. Your sense of taste and smell improve.  After 72 hours, the body is virtually free of nicotine. Your bronchial tubes relax and breathing becomes easier.  After 2 to 12 weeks, lungs can hold more air. Exercise becomes easier and circulation improves.  The risk of having a heart attack, stroke, cancer, or lung disease is greatly reduced.  After 1 year, the risk of coronary heart disease is cut in half.  After 5 years, the risk of stroke falls to the same as a nonsmoker.  After 10 years, the risk of lung cancer is cut in half and the risk of other cancers decreases significantly.  After 15 years, the risk of coronary heart disease drops, usually to the level of a nonsmoker.  If you are pregnant, quitting smoking will improve your chances of having a healthy baby.  The people you live with, especially any children, will be healthier.  You will have extra money to spend on things other than cigarettes. QUESTIONS TO THINK ABOUT BEFORE ATTEMPTING TO QUIT You may want to talk about your answers with your  caregiver.  Why do you want to quit?  If you tried to quit in the past, what helped and what did not?  What will be the most difficult situations for you after you quit? How will you plan to handle them?  Who can help you through the tough times? Your family? Friends? A caregiver?  What pleasures do you get from smoking? What ways can you still get pleasure if you quit? Here are some questions to ask your caregiver:  How can you help me to be successful at quitting?  What medicine do you think would be best for me and how should I take it?  What should I do if I need more help?  What is smoking withdrawal like? How can I get information on withdrawal? GET READY  Set a quit date.  Change your environment by getting rid of all cigarettes, ashtrays, matches, and lighters in your home, car, or work. Do not let people smoke in your home.  Review your past attempts to quit. Think about what worked and what did not. GET SUPPORT AND ENCOURAGEMENT You have a better chance of being successful if you have help. You can get support in many ways.  Tell your family, friends, and co-workers that you are going to quit and need their support. Ask them not to smoke around you.  Get individual, group, or telephone counseling and support. Programs are available at local hospitals and health centers. Call your local health department for   information about programs in your area.  Spiritual beliefs and practices may help some smokers quit.  Download a "quit meter" on your computer to keep track of quit statistics, such as how long you have gone without smoking, cigarettes not smoked, and money saved.  Get a self-help book about quitting smoking and staying off of tobacco. [place] yourself from urges to smoke. Talk to someone, go for a walk, or occupy your time with a task.  Change your normal routine. Take a different route to work. Drink tea instead of coffee.  Eat breakfast in a different place.  Reduce your stress. Take a hot bath, exercise, or read a book.  Plan something enjoyable to do every day. Reward yourself for not smoking.  Explore interactive web-based programs that specialize in helping you quit. GET MEDICINE AND USE IT CORRECTLY Medicines can help you stop smoking and decrease the urge to smoke. Combining medicine with the above behavioral methods and support can greatly increase your chances of successfully quitting smoking.  Nicotine replacement therapy helps deliver nicotine to your body without the negative effects and risks of smoking. Nicotine replacement therapy includes nicotine gum, lozenges, inhalers, nasal sprays, and skin patches. Some may be available over-the-counter and others require a prescription.  Antidepressant medicine helps people abstain from smoking, but how this works is unknown. This medicine is available by prescription.  Nicotinic receptor partial agonist medicine simulates the effect of nicotine in your brain. This medicine is available by prescription. Ask your caregiver for advice about which medicines to use and how to use them based on your health history. Your caregiver will tell you what side effects to look out for if you choose to be on a medicine or therapy. Carefully read the information on the package. Do not use any other product containing nicotine while using a nicotine replacement product.  RELAPSE OR DIFFICULT SITUATIONS Most relapses occur within the first 3 months after quitting. Do not be discouraged if you start smoking again. Remember, most people try several times before finally quitting. You may have symptoms of withdrawal because your body is used to nicotine. You may crave cigarettes, be irritable, feel very hungry, cough often, get headaches, or have difficulty concentrating. The withdrawal symptoms are only temporary. They are strongest when you first quit, but they will go away within  10 14 days. To reduce the chances of relapse, try to:  Avoid drinking alcohol. Drinking lowers your chances of successfully quitting.  Reduce the amount of caffeine you consume. Once you quit smoking, the amount of caffeine in your body increases and can give you symptoms, such as a rapid heartbeat, sweating, and anxiety.  Avoid smokers because they can make you want to smoke.  Do not let weight gain distract you. Many smokers will gain weight when they quit, usually less than 10 pounds. Eat a healthy diet and stay active. You can always lose the weight gained after you quit.  Find ways to improve your mood other than smoking. FOR MORE INFORMATION  www.smokefree.gov  Document Released: 12/26/2000 Document Revised: 07/03/2011 Document Reviewed: 04/12/2011 [facility] Patient Information 2014 [place], [place].   Chronic Kidney Disease Chronic kidney disease occurs when the kidneys are damaged over a long period. The kidneys are two organs that lie on either side of the spine between the middle of the back and the front of the abdomen. The kidneys:   Remove wastes and extra water from the blood.   Produce  important hormones. These help keep bones strong, regulate blood pressure, and help create red blood cells.   Balance the fluids and chemicals in the blood and tissues. A small amount of kidney damage may not cause problems, but a large amount of damage may make it difficult or impossible for the kidneys to work the way they should. If steps are not taken to slow down the kidney damage or stop it from getting worse, the kidneys may stop working permanently. Most of the time, chronic kidney disease does not go away. However, it can often be controlled, and those with the disease can usually live normal lives. CAUSES  The most common causes of chronic kidney disease are diabetes and high blood pressure (hypertension). Chronic kidney disease may also be caused by:   Diseases that cause  kidneys' filters to become inflamed.   Diseases that affect the immune system.   Genetic diseases.   Medicines that damage the kidneys, such as anti-inflammatory medicines.  Poisoning or exposure to toxic substances.   A reoccurring kidney or urinary infection.   A problem with urine flow. This may be caused by:   Cancer.   Kidney stones.   An enlarged prostate in males. SYMPTOMS  Because the kidney damage in chronic kidney disease occurs slowly, symptoms develop slowly and may not be obvious until the kidney damage becomes severe. A person may have a kidney disease for years without showing any symptoms. Symptoms can include:   Swelling (edema) of the legs, ankles, or feet.   Tiredness (lethargy).   Nausea or vomiting.   Confusion.   Problems with urination, such as:   Decreased urine production.   Frequent urination, especially at night.   Frequent accidents in children who are potty trained.   Muscle twitches and cramps.   Shortness of breath.  Weakness.   Persistent itchiness.   Loss of appetite.  Metallic taste in the mouth.  Trouble sleeping.  Slowed development in children.  Short stature in children. DIAGNOSIS  Chronic kidney disease may be detected and diagnosed by tests, including blood, urine, imaging, or kidney biopsy tests.  TREATMENT  Most chronic kidney diseases cannot be cured. Treatment usually involves relieving symptoms and preventing or slowing the progression of the disease. Treatment may include:   A special diet. You may need to avoid alcohol and foods thatare salty and high in potassium.   Medicines. These may:   Lower blood pressure.   Relieve anemia.   Relieve swelling.   Protect the bones. HOME CARE INSTRUCTIONS   Follow your prescribed diet.   Only take over-the-counter or prescription medicines as directed by your caregiver.  Do not take any new medicines (prescription,  over-the-counter, or nutritional supplements) unless approved by your caregiver. Many medicines can worsen your kidney damage or need to have the dose adjusted.   Quit smoking if you are a smoker. Talk to your caregiver about a smoking cessation program.   Keep all follow-up appointments as directed by your caregiver. SEEK IMMEDIATE MEDICAL CARE IF:  Your symptoms get worse or you develop new symptoms.   You develop symptoms of end-stage kidney disease. These include:   Headaches.   Abnormally dark or light skin.   Numbness in the hands or feet.   Easy bruising.   Frequent hiccups.   Menstruation stops.   You have a fever.   You have decreased urine production.   You havepain or bleeding when urinating. MAKE SURE YOU:  Understand these instructions.  Will watch your condition.  Will get help right away if you are not doing well or get worse. FOR MORE INFORMATION  American Association of Kidney Patients: BombTimer.gl National Kidney Foundation: www.kidney.[place]: https://mathis.com/ [facility]: www.lifeoptions.org and www.kidneyschool.org Document Released: 10/11/2007 Document Revised: 12/19/2011 Document Reviewed: 08/31/2011 [facility] Patient Information 2014 [place], [place].

## 2013-04-24 NOTE — Progress Notes (Signed)
Patient ID: Kevin Ho, male   DOB: 17-Feb-1931, 80 y.o.   MRN: [id number] SUBJECTIVE: CC: Chief Complaint  Patient presents with  . Follow-up    reck      HPI: Tried chantix and it caused confusion in the past.does not want to go back on it  Patient is here for follow up of hypertension: denies Headache;deniesChest Pain;denies weakness;denies Shortness of Breath or Orthopnea;denies Visual changes;denies palpitations;denies cough;denies pedal edema;denies symptoms of TIA or stroke; admits to Compliance with medications. denies Problems with medications.   Past Medical History  Diagnosis Date  . Hyperlipidemia   . Hypertension   . Chronic pain   . Insomnia   . Varicose veins   . Cancer     neck  . Anxiety   . Arthritis    Past Surgical History  Procedure Laterality Date  . Right inguinal hernia     History   Social History  . Marital Status: Widowed    Spouse Name: N/A    Number of Children: N/A  . Years of Education: N/A   Occupational History  . Not on file.   Social History Main Topics  . Smoking status: Current Every Day Smoker -- 1.00 packs/day    Types: Cigarettes  . Smokeless tobacco: Not on file  . Alcohol Use: No  . Drug Use: No  . Sexual Activity: Not on file   Other Topics Concern  . Not on file   Social History Narrative  . No narrative on file   No family history on file. Current Outpatient Prescriptions on File Prior to Visit  Medication Sig Dispense Refill  . amLODipine (NORVASC) 5 MG tablet Take 1 tablet (5 mg total) by mouth daily.  30 tablet  3  . aspirin 325 MG tablet Take 325 mg by mouth daily.      . busPIRone (BUSPAR) 5 MG tablet Take 1 tablet (5 mg total) by mouth 3 (three) times daily.  90 tablet  1  . fenofibrate 160 MG tablet Take 1 tablet (160 mg total) by mouth daily.  30 tablet  4  . levothyroxine (SYNTHROID, LEVOTHROID) 75 MCG tablet Take 1 tablet (75 mcg total) by mouth daily before breakfast.  30 tablet  6  .  pravastatin (PRAVACHOL) 40 MG tablet Take 1 tablet (40 mg total) by mouth daily.  30 tablet  6  . sodium polystyrene (KAYEXALATE) 15 GM/60ML suspension Take 60 mLs (15 g total) by mouth once. Daily for 2 days  500 mL  0  . temazepam (RESTORIL) 30 MG capsule Take 1 capsule (30 mg total) by mouth at bedtime.  30 capsule  1   No current facility-administered medications on file prior to visit.   Allergies  Allergen Reactions  . Chantix [Varenicline]    Immunization History  Administered Date(s) Administered  . Influenza, High Dose Seasonal PF 10/15/2012   Prior to Admission medications   Medication Sig Start Date End Date Taking? Authorizing Provider  amLODipine (NORVASC) 5 MG tablet Take 1 tablet (5 mg total) by mouth daily. 03/02/13  Yes Vernie Shanks, MD  aspirin 325 MG tablet Take 325 mg by mouth daily.   Yes Historical Provider, MD  busPIRone (BUSPAR) 5 MG tablet Take 1 tablet (5 mg total) by mouth 3 (three) times daily. 03/26/13  Yes Vernie Shanks, MD  fenofibrate 160 MG tablet Take 1 tablet (160 mg total) by mouth daily. 03/20/13  Yes Shanda Howells, MD  levothyroxine (SYNTHROID, Ducor) 75 MCG  tablet Take 1 tablet (75 mcg total) by mouth daily before breakfast. 01/27/13  Yes Shanda Howells, MD  pravastatin (PRAVACHOL) 40 MG tablet Take 1 tablet (40 mg total) by mouth daily. 01/27/13  Yes Shanda Howells, MD  sodium polystyrene (KAYEXALATE) 15 GM/60ML suspension Take 60 mLs (15 g total) by mouth once. Daily for 2 days 03/03/13  Yes Vernie Shanks, MD  temazepam (RESTORIL) 30 MG capsule Take 1 capsule (30 mg total) by mouth at bedtime. 03/19/13  Yes Vernie Shanks, MD     ROS: As above in the HPI. All other systems are stable or negative.  OBJECTIVE: APPEARANCE:  Patient in no acute distress.The patient appeared well nourished and normally developed. Acyanotic. Waist: VITAL SIGNS:BP 131/71  Pulse 72  Temp(Src) 98 F (36.7 C) (Oral)  Ht 5' 3"  (1.6 m)  Wt 138 lb 9.6 oz (62.869 kg)   BMI 24.56 kg/m2 WM, poor habitus.  SKIN: warm and  Dry without overt rashes, tattoos and scars  HEAD and Neck: without JVD, Head and scalp: normal Eyes:No scleral icterus. Fundi normal, eye movements normal. Ears: Auricle normal, canal normal, Tympanic membranes normal, insufflation normal. Nose: normal Throat: normal Neck & thyroid: normal  CHEST & LUNGS: Chest wall: normal Lungs: Coarse breath sounds  CVS: Reveals the PMI to be normally located. Regular rhythm, First and Second Heart sounds are normal,  absence of murmurs, rubs or gallops. Peripheral vasculature: Radial pulses: normal Dorsal pedis pulses: normal Posterior pulses: normal  ABDOMEN:  Appearance: normal Benign, no organomegaly, no masses, no Abdominal Aortic enlargement. No Guarding , no rebound. No Bruits. Bowel sounds: normal  RECTAL: N/A GU: N/A  EXTREMETIES: nonedematous.  NEUROLOGIC: oriented to time,place and person; nonfocal.   ASSESSMENT:  Tobacco user - Plan: CMP14+EGFR  Other and unspecified hyperlipidemia - Plan: CMP14+EGFR  Hyperkalemia - Plan: CMP14+EGFR, CANCELED: CMP14+EGFR  Hypercalcemia - Plan: Parathyroid hormone, intact (no Ca), CMP14+EGFR, CANCELED: CMP14+EGFR  Hypothyroidism - Plan: CMP14+EGFR  Anxiety state, unspecified - Plan: CMP14+EGFR  HTN (hypertension) - Plan: CMP14+EGFR, CANCELED: CMP14+EGFR  Cancer - Plan: CMP14+EGFR  Arthritis - Plan: CMP14+EGFR  CKD (chronic kidney disease), stage III - Plan: Phosphorus, POCT urinalysis dipstick, POCT UA - Microscopic Only, CMP14+EGFR    PLAN: Discussed with patient about his CKD 3 at length. He has been referred to Renal. Awaiting the appointment to Renal. Daughter-in-law: called but she is not on the HIPPA form so our condition was limited. Reviewed record. Reviewed previous labs. Results for orders placed in visit on 04/24/13  POCT URINALYSIS DIPSTICK      Result Value Ref Range   Color, UA yellow     Clarity,  UA clear     Glucose, UA neg     Bilirubin, UA neg     Ketones, UA neg     Spec Grav, UA 1.015     Blood, UA neg     pH, UA 7.0     Protein, UA neg     Urobilinogen, UA negative     Nitrite, UA neg     Leukocytes, UA Negative    POCT UA - MICROSCOPIC ONLY      Result Value Ref Range   WBC, Ur, HPF, POC neg     RBC, urine, microscopic neg     Bacteria, U Microscopic neg     Mucus, UA neg     Epithelial cells, urine per micros rare     Crystals, Ur, HPF, POC neg     Casts,  Ur, LPF, POC neg     Yeast, UA occ      Orders Placed This Encounter  Procedures  . Phosphorus  . Parathyroid hormone, intact (no Ca)  . CMP14+EGFR  . POCT urinalysis dipstick  . POCT UA - Microscopic Only   Patient needs renal evaluation of his recurrent  hyperkalemia and hypercalcemi and CKD III. Patient understands.   No orders of the defined types were placed in this encounter.   There are no discontinued medications. Return in about 2 months (around 06/24/2013) for Recheck medical problems.  Deanda Ruddell P. Jacelyn Grip, M.D.

## 2013-04-26 NOTE — Progress Notes (Signed)
Quick Note:  Call Patient Labs that are abnormal: Calcium is still high Chronic kidney disease slowly deteriorating. Needs to stop smoking and needs to see the kidney specialist.  The rest are at goal  Recommendations:    ______

## 2013-04-27 ENCOUNTER — Encounter (HOSPITAL_COMMUNITY): Payer: Self-pay | Admitting: Emergency Medicine

## 2013-04-27 ENCOUNTER — Emergency Department (HOSPITAL_COMMUNITY)
Admission: EM | Admit: 2013-04-27 | Discharge: 2013-04-27 | Disposition: A | Discharge status: Home / Self Care | Payer: Medicare HMO | Attending: Emergency Medicine | Admitting: Emergency Medicine

## 2013-04-27 DIAGNOSIS — Z85828 Personal history of other malignant neoplasm of skin: Secondary | ICD-10-CM | POA: Insufficient documentation

## 2013-04-27 DIAGNOSIS — R7989 Other specified abnormal findings of blood chemistry: Secondary | ICD-10-CM | POA: Insufficient documentation

## 2013-04-27 DIAGNOSIS — Z79899 Other long term (current) drug therapy: Secondary | ICD-10-CM | POA: Insufficient documentation

## 2013-04-27 DIAGNOSIS — R899 Unspecified abnormal finding in specimens from other organs, systems and tissues: Secondary | ICD-10-CM

## 2013-04-27 DIAGNOSIS — F411 Generalized anxiety disorder: Secondary | ICD-10-CM | POA: Insufficient documentation

## 2013-04-27 DIAGNOSIS — Z7982 Long term (current) use of aspirin: Secondary | ICD-10-CM | POA: Insufficient documentation

## 2013-04-27 DIAGNOSIS — I10 Essential (primary) hypertension: Secondary | ICD-10-CM | POA: Insufficient documentation

## 2013-04-27 DIAGNOSIS — G8929 Other chronic pain: Secondary | ICD-10-CM | POA: Insufficient documentation

## 2013-04-27 DIAGNOSIS — E785 Hyperlipidemia, unspecified: Secondary | ICD-10-CM | POA: Insufficient documentation

## 2013-04-27 DIAGNOSIS — F17200 Nicotine dependence, unspecified, uncomplicated: Secondary | ICD-10-CM | POA: Insufficient documentation

## 2013-04-27 DIAGNOSIS — M129 Arthropathy, unspecified: Secondary | ICD-10-CM | POA: Insufficient documentation

## 2013-04-27 LAB — CBC WITH DIFFERENTIAL/PLATELET
BASOS ABS: 0 10*3/uL (ref 0.0–0.1)
BASOS PCT: 1 % (ref 0–1)
EOS PCT: 4 % (ref 0–5)
Eosinophils Absolute: 0.3 10*3/uL (ref 0.0–0.7)
HCT: 36.9 % — ABNORMAL LOW (ref 39.0–52.0)
Hemoglobin: 12.7 g/dL — ABNORMAL LOW (ref 13.0–17.0)
LYMPHS PCT: 13 % (ref 12–46)
Lymphs Abs: 1 10*3/uL (ref 0.7–4.0)
MCH: 31.5 pg (ref 26.0–34.0)
MCHC: 34.4 g/dL (ref 30.0–36.0)
MCV: 91.6 fL (ref 78.0–100.0)
Monocytes Absolute: 1 10*3/uL (ref 0.1–1.0)
Monocytes Relative: 12 % (ref 3–12)
Neutro Abs: 5.7 10*3/uL (ref 1.7–7.7)
Neutrophils Relative %: 70 % (ref 43–77)
PLATELETS: 226 10*3/uL (ref 150–400)
RBC: 4.03 MIL/uL — ABNORMAL LOW (ref 4.22–5.81)
RDW: 12.7 % (ref 11.5–15.5)
WBC: 8.1 10*3/uL (ref 4.0–10.5)

## 2013-04-27 LAB — CMP14+EGFR
ALT: 6 IU/L (ref 0–44)
AST: 16 IU/L (ref 0–40)
Albumin/Globulin Ratio: 1.5 (ref 1.1–2.5)
Albumin: 4.8 g/dL (ref 3.5–4.8)
Alkaline Phosphatase: 48 IU/L (ref 39–117)
BUN/Creatinine Ratio: 12 (ref 10–22)
BUN: 24 mg/dL (ref 8–27)
CO2: 25 mmol/L (ref 18–29)
Calcium: 11.8 mg/dL — ABNORMAL HIGH (ref 8.6–10.2)
Chloride: 99 mmol/L (ref 97–108)
Creatinine, Ser: 2.06 mg/dL — ABNORMAL HIGH (ref 0.76–1.27)
GFR calc Af Amer: 34 mL/min/{1.73_m2} — ABNORMAL LOW (ref >59)
GFR calc non Af Amer: 30 mL/min/{1.73_m2} — ABNORMAL LOW (ref >59)
Globulin, Total: 3.1 g/dL (ref 1.5–4.5)
Glucose: 100 mg/dL — ABNORMAL HIGH (ref 65–99)
Potassium: 6 mmol/L (ref 3.5–5.2)
Sodium: 140 mmol/L (ref 134–144)
Total Bilirubin: 0.3 mg/dL (ref 0.0–1.2)
Total Protein: 7.9 g/dL (ref 6.0–8.5)

## 2013-04-27 LAB — COMPREHENSIVE METABOLIC PANEL
ALBUMIN: 3.9 g/dL (ref 3.5–5.2)
ALT: 5 U/L (ref 0–53)
AST: 14 U/L (ref 0–37)
Alkaline Phosphatase: 40 U/L (ref 39–117)
BUN: 23 mg/dL (ref 6–23)
CALCIUM: 10.7 mg/dL — AB (ref 8.4–10.5)
CO2: 27 mEq/L (ref 19–32)
Chloride: 99 mEq/L (ref 96–112)
Creatinine, Ser: 1.78 mg/dL — ABNORMAL HIGH (ref 0.50–1.35)
GFR calc Af Amer: 40 mL/min — ABNORMAL LOW (ref >90)
GFR, EST NON AFRICAN AMERICAN: 35 mL/min — AB (ref >90)
Glucose, Bld: 123 mg/dL — ABNORMAL HIGH (ref 70–99)
Potassium: 4.1 mEq/L (ref 3.7–5.3)
SODIUM: 140 meq/L (ref 137–147)
TOTAL PROTEIN: 7.6 g/dL (ref 6.0–8.3)
Total Bilirubin: 0.3 mg/dL (ref 0.3–1.2)

## 2013-04-27 LAB — PHOSPHORUS: Phosphorus: 3.6 mg/dL (ref 2.5–4.5)

## 2013-04-27 LAB — PARATHYROID HORMONE, INTACT (NO CA): PTH: 18 pg/mL (ref 15–65)

## 2013-04-27 NOTE — Discharge Instructions (Signed)
As discussed, your evaluation today has been largely reassuring.  But, it is important that you monitor your condition carefully, and do not hesitate to return to the ED if you develop new, or concerning changes in your condition. ? ?Otherwise, please follow-up with your physician for appropriate ongoing care. ? ?

## 2013-04-27 NOTE — ED Provider Notes (Signed)
CSN: [id number]     Arrival date & time 04/27/13  1108 History   First MD Initiated Contact with Patient 04/27/13 1123     Chief Complaint  Patient presents with  . Abnormal Lab     (Consider location/radiation/quality/duration/timing/severity/associated sxs/prior Treatment) HPI Patient presents due to possible abnormal lab results an outside hospital evaluation. Patient denies any complaints, any recent health status changes, any present medical concerns. Patient had regular checkup, was found to have elevated potassium and was sent here for evaluation.   Past Medical History  Diagnosis Date  . Hyperlipidemia   . Hypertension   . Chronic pain   . Insomnia   . Varicose veins   . Cancer     neck  . Anxiety   . Arthritis    Past Surgical History  Procedure Laterality Date  . Right inguinal hernia     History reviewed. No pertinent family history. History  Substance Use Topics  . Smoking status: Current Every Day Smoker -- 1.00 packs/day    Types: Cigarettes  . Smokeless tobacco: Not on file  . Alcohol Use: No    Review of Systems  All other systems reviewed and are negative.     Allergies  Chantix  Home Medications   Current Outpatient Rx  Name  Route  Sig  Dispense  Refill  . amLODipine (NORVASC) 5 MG tablet   Oral   Take 1 tablet (5 mg total) by mouth daily.   30 tablet   3   . aspirin 325 MG tablet   Oral   Take 325 mg by mouth daily.         . busPIRone (BUSPAR) 5 MG tablet   Oral   Take 1 tablet (5 mg total) by mouth 3 (three) times daily.   90 tablet   1   . fenofibrate 160 MG tablet   Oral   Take 1 tablet (160 mg total) by mouth daily.   30 tablet   4   . levothyroxine (SYNTHROID, LEVOTHROID) 75 MCG tablet   Oral   Take 1 tablet (75 mcg total) by mouth daily before breakfast.   30 tablet   6   . pravastatin (PRAVACHOL) 40 MG tablet   Oral   Take 1 tablet (40 mg total) by mouth daily.   30 tablet   6   . temazepam  (RESTORIL) 30 MG capsule   Oral   Take 1 capsule (30 mg total) by mouth at bedtime.   30 capsule   1    BP 152/63  Pulse 72  Temp(Src) 98.1 F (36.7 C) (Oral)  Resp 19  Ht 5\' 5"  (1.651 m)  Wt 138 lb (62.596 kg)  BMI 22.96 kg/m2  SpO2 100% Physical Exam  Nursing note and vitals reviewed. Constitutional: He is oriented to person, place, and time. He appears well-developed. No distress.  HENT:  Head: Normocephalic and atraumatic.  Eyes: Conjunctivae and EOM are normal.  Pulmonary/Chest: Effort normal. No stridor. No respiratory distress.  Musculoskeletal: He exhibits no edema.  Neurological: He is alert and oriented to person, place, and time. No cranial nerve deficit. Coordination normal.  Skin: Skin is warm and dry. He is not diaphoretic.  Psychiatric: He has a normal mood and affect.    ED Course  Procedures (including critical care time) Labs Review Labs Reviewed  CBC WITH DIFFERENTIAL - Abnormal; Notable for the following:    RBC 4.03 (*)    Hemoglobin 12.7 (*)  HCT 36.9 (*)    All other components within normal limits  COMPREHENSIVE METABOLIC PANEL - Abnormal; Notable for the following:    Glucose, Bld 123 (*)    Creatinine, Ser 1.78 (*)    Calcium 10.7 (*)    GFR calc non Af Amer 35 (*)    GFR calc Af Amer 40 (*)    All other components within normal limits     EKG Interpretation   Date/Time:  Monday April 27 2013 11:13:24 EDT Ventricular Rate:  80 PR Interval:  176 QRS Duration: 132 QT Interval:  402 QTC Calculation: 463 R Axis:   -84 Text Interpretation:  Sinus rhythm with marked sinus arrhythmia with  occasional Premature ventricular complexes Left axis deviation Right  bundle branch block Abnormal ECG Sinus rhythm Sinus arrhythmia Premature  ventricular complexes Abnormal ekg Confirmed by Carmin Muskrat  MD  [phone]) on 04/27/2013 1:03:45 PM     I reviewed the EMR MDM   Final diagnoses:  Abnormal laboratory test    Patient presents with  concern of possible abnormal lab results.  On exam patient awake and alert, with no complaints.  Patient has a history of chronic kidney disease, this seems unchanged.  Remainder of labs are consistent for him.  Patient was discharged in stable condition to follow up with primary care.    Carmin Muskrat, MD 04/27/13 [phone]

## 2013-04-27 NOTE — ED Notes (Signed)
Pt reassured dog ok in running car with security watching.

## 2013-04-27 NOTE — ED Notes (Signed)
Pt sent here for eval of possible elevated K+; pt denies complaint and sts was only having regular yearly screening labs

## 2013-04-27 NOTE — ED Notes (Signed)
PT ambulated with baseline gait; VSS; A&Ox3; no signs of distress; respirations even and unlabored; skin warm and dry; no questions upon discharge.  

## 2013-05-14 ENCOUNTER — Other Ambulatory Visit: Payer: Self-pay | Admitting: Family Medicine

## 2013-05-15 ENCOUNTER — Ambulatory Visit: Payer: Medicare HMO | Admitting: Family Medicine

## 2013-05-15 NOTE — Telephone Encounter (Signed)
Rx ready for nurse to Phone in. 

## 2013-05-15 NOTE — Telephone Encounter (Signed)
Rx called in 

## 2013-05-15 NOTE — Telephone Encounter (Signed)
Patient last seen in office on 04-24-13. Rx last filled on 04-18-13. Please advise. If approved please route to Pool A so nurse can phone in to pharmacy

## 2013-06-17 ENCOUNTER — Other Ambulatory Visit: Payer: Self-pay | Admitting: *Deleted

## 2013-06-17 MED ORDER — TEMAZEPAM 30 MG PO CAPS: ORAL_CAPSULE | ORAL | Status: DC

## 2013-06-17 NOTE — Telephone Encounter (Signed)
This is okay to refill 

## 2013-06-17 NOTE — Telephone Encounter (Signed)
Last ov 04/24/13. Last refill 05/15/13. Call to [facility] [place] if approved

## 2013-06-21 ENCOUNTER — Other Ambulatory Visit: Payer: Self-pay | Admitting: Nurse Practitioner

## 2013-06-22 NOTE — Telephone Encounter (Signed)
Called refill to [facility] per [facility]

## 2013-06-26 ENCOUNTER — Other Ambulatory Visit: Payer: Self-pay | Admitting: *Deleted

## 2013-06-26 ENCOUNTER — Ambulatory Visit (INDEPENDENT_AMBULATORY_CARE_PROVIDER_SITE_OTHER): Payer: Medicare HMO | Admitting: Family

## 2013-06-26 ENCOUNTER — Encounter: Payer: Self-pay | Admitting: Family

## 2013-06-26 VITALS — BP 138/70 | HR 67 | Temp 96.7°F | Ht 65.0 in | Wt 141.2 lb

## 2013-06-26 DIAGNOSIS — R252 Cramp and spasm: Secondary | ICD-10-CM

## 2013-06-26 DIAGNOSIS — E785 Hyperlipidemia, unspecified: Secondary | ICD-10-CM

## 2013-06-26 DIAGNOSIS — R5381 Other malaise: Secondary | ICD-10-CM

## 2013-06-26 DIAGNOSIS — R5383 Other fatigue: Secondary | ICD-10-CM

## 2013-06-26 DIAGNOSIS — R110 Nausea: Secondary | ICD-10-CM

## 2013-06-26 DIAGNOSIS — R42 Dizziness and giddiness: Secondary | ICD-10-CM

## 2013-06-26 MED ORDER — PRAVASTATIN SODIUM 40 MG PO TABS: 40.0000 mg | ORAL_TABLET | Freq: Every day | ORAL | Status: DC

## 2013-06-26 MED ORDER — ONDANSETRON HCL 4 MG PO TABS: 4.0000 mg | ORAL_TABLET | Freq: Three times a day (TID) | ORAL | Status: DC | PRN

## 2013-06-26 NOTE — Patient Instructions (Signed)
Fatigue Fatigue is a feeling of tiredness, lack of energy, lack of motivation, or feeling tired all the time. Having enough rest, good nutrition, and reducing stress will normally reduce fatigue. Consult your caregiver if it persists. The nature of your fatigue will help your caregiver to find out its cause. The treatment is based on the cause.  CAUSES  There are many causes for fatigue. Most of the time, fatigue can be traced to one or more of your habits or routines. Most causes fit into one or more of three general areas. They are: Lifestyle problems  Sleep disturbances.  Overwork.  Physical exertion.  Unhealthy habits.  Poor eating habits or eating disorders.  Alcohol and/or drug use .  Lack of proper nutrition (malnutrition). Psychological problems  Stress and/or anxiety problems.  Depression.  Grief.  Boredom. Medical Problems or Conditions  Anemia.  Pregnancy.  Thyroid gland problems.  Recovery from major surgery.  Continuous pain.  Emphysema or asthma that is not well controlled  Allergic conditions.  Diabetes.  Infections (such as mononucleosis).  Obesity.  Sleep disorders, such as sleep apnea.  Heart failure or other heart-related problems.  Cancer.  Kidney disease.  Liver disease.  Effects of certain medicines such as antihistamines, cough and cold remedies, prescription pain medicines, heart and blood pressure medicines, drugs used for treatment of cancer, and some antidepressants. SYMPTOMS  The symptoms of fatigue include:   Lack of energy.  Lack of drive (motivation).  Drowsiness.  Feeling of indifference to the surroundings. DIAGNOSIS  The details of how you feel help guide your caregiver in finding out what is causing the fatigue. You will be asked about your present and past health condition. It is important to review all medicines that you take, including prescription and non-prescription items. A thorough exam will be done.  You will be questioned about your feelings, habits, and normal lifestyle. Your caregiver may suggest blood tests, urine tests, or other tests to look for common medical causes of fatigue.  TREATMENT  Fatigue is treated by correcting the underlying cause. For example, if you have continuous pain or depression, treating these causes will improve how you feel. Similarly, adjusting the dose of certain medicines will help in reducing fatigue.  HOME CARE INSTRUCTIONS   Try to get the required amount of good sleep every night.  Eat a healthy and nutritious diet, and drink enough water throughout the day.  Practice ways of relaxing (including yoga or meditation).  Exercise regularly.  Make plans to change situations that cause stress. Act on those plans so that stresses decrease over time. Keep your work and personal routine reasonable.  Avoid street drugs and minimize use of alcohol.  Start taking a daily multivitamin after consulting your caregiver. SEEK MEDICAL CARE IF:   You have persistent tiredness, which cannot be accounted for.  You have fever.  You have unintentional weight loss.  You have headaches.  You have disturbed sleep throughout the night.  You are feeling sad.  You have constipation.  You have dry skin.  You have gained weight.  You are taking any new or different medicines that you suspect are causing fatigue.  You are unable to sleep at night.  You develop any unusual swelling of your legs or other parts of your body. SEEK IMMEDIATE MEDICAL CARE IF:   You are feeling confused.  Your vision is blurred.  You feel faint or pass out.  You develop severe headache.  You develop severe abdominal, pelvic, or   back pain.  You develop chest pain, shortness of breath, or an irregular or fast heartbeat.  You are unable to pass a normal amount of urine.  You develop abnormal bleeding such as bleeding from the rectum or you vomit blood.  You have thoughts  about harming yourself or committing suicide.  You are worried that you might harm someone else. MAKE SURE YOU:   Understand these instructions.  Will watch your condition.  Will get help right away if you are not doing well or get worse. Document Released: 10/29/2006 Document Revised: 03/26/2011 Document Reviewed: 10/29/2006 [facility] Patient Information 2014 [place]. Dizziness Dizziness is a common problem. It is a feeling of unsteadiness or lightheadedness. You may feel like you are about to faint. Dizziness can lead to injury if you stumble or fall. A person of any age group can suffer from dizziness, but dizziness is more common in older adults. CAUSES  Dizziness can be caused by many different things, including:  Middle ear problems.  Standing for too long.  Infections.  An allergic reaction.  Aging.  An emotional response to something, such as the sight of blood.  Side effects of medicines.  Fatigue.  Problems with circulation or blood pressure.  Excess use of alcohol, medicines, or illegal drug use.  Breathing too fast (hyperventilation).  An arrhythmia or problems with your heart rhythm.  Low red blood cell count (anemia).  Pregnancy.  Vomiting, diarrhea, fever, or other illnesses that cause dehydration.  Diseases or conditions such as Parkinson's disease, high blood pressure (hypertension), diabetes, and thyroid problems.  Exposure to extreme heat. DIAGNOSIS  To find the cause of your dizziness, your caregiver may do a physical exam, lab tests, radiologic imaging scans, or an electrocardiography test (ECG).  TREATMENT  Treatment of dizziness depends on the cause of your symptoms and can vary greatly. HOME CARE INSTRUCTIONS   Drink enough fluids to keep your urine clear or pale yellow. This is especially important in very hot weather. In the elderly, it is also important in cold weather.  If your dizziness is caused by medicines, take them  exactly as directed. When taking blood pressure medicines, it is especially important to get up slowly.  Rise slowly from chairs and steady yourself until you feel okay.  In the morning, first sit up on the side of the bed. When this seems okay, stand slowly while holding onto something until you know your balance is fine.  If you need to stand in one place for a long time, be sure to move your legs often. Tighten and relax the muscles in your legs while standing.  If dizziness continues to be a problem, have someone stay with you for a day or two. Do this until you feel you are well enough to stay alone. Have the person call your caregiver if he or she notices changes in you that are concerning.  Do not drive or use heavy machinery if you feel dizzy.  Do not drink alcohol. SEEK IMMEDIATE MEDICAL CARE IF:   Your dizziness or lightheadedness gets worse.  You feel nauseous or vomit.  You develop problems with talking, walking, weakness, or using your arms, hands, or legs.  You are not thinking clearly or you have difficulty forming sentences. It may take a friend or family member to determine if your thinking is normal.  You develop chest pain, abdominal pain, shortness of breath, or sweating.  Your vision changes.  You notice any bleeding.  You have  side effects from medicine that seems to be getting worse rather than better. MAKE SURE YOU:   Understand these instructions.  Will watch your condition.  Will get help right away if you are not doing well or get worse. Document Released: 06/27/2000 Document Revised: 03/26/2011 Document Reviewed: 07/21/2010 [facility] Patient Information 2014 [place], [place].

## 2013-06-26 NOTE — Progress Notes (Signed)
   Subjective:    Patient ID: Kevin Ho, male    DOB: 1931/04/11, 80 y.o.   MRN: [id number]  Dizziness This is a new problem. The current episode started more than 1 year ago. The problem occurs constantly. The problem has been gradually worsening. Associated symptoms include anorexia, fatigue, myalgias, swollen glands and weakness. Pertinent negatives include no abdominal pain, chills, congestion or fever. Exacerbated by: "Taking levothroxine in am" He has tried rest for the symptoms.      Review of Systems  Constitutional: Positive for fatigue. Negative for fever and chills.  HENT: Negative for congestion.   Respiratory: Negative.   Cardiovascular: Negative.   Gastrointestinal: Positive for anorexia. Negative for abdominal pain.  Musculoskeletal: Positive for myalgias.  Neurological: Positive for dizziness and weakness.  All other systems reviewed and are negative.      Objective:   Physical Exam  Vitals reviewed. Constitutional: He is oriented to person, place, and time. He appears well-developed and well-nourished. No distress.  Cardiovascular: Normal rate, regular rhythm, normal heart sounds and intact distal pulses.   No murmur heard. Pulmonary/Chest: Effort normal and breath sounds normal. No respiratory distress. He has no wheezes.  Abdominal: Soft. Bowel sounds are normal. He exhibits no distension. There is no tenderness.  Musculoskeletal: Normal range of motion. He exhibits no edema and no tenderness.  Neurological: He is alert and oriented to person, place, and time. He has normal reflexes. No cranial nerve deficit.  Skin: Skin is warm and dry. No rash noted. No erythema.  Psychiatric: He has a normal mood and affect. His behavior is normal. Judgment and thought content normal.     BP 138/70  Pulse 67  Temp(Src) 96.7 F (35.9 C) (Oral)  Ht $R'5\' 5"'CJ$  (1.651 m)  Wt 141 lb 3.2 oz (64.048 kg)  BMI 23.50 kg/m2      Assessment & Plan:  1. Dizziness -Falls  precaution discussed - CMP14+EGFR - Thyroid Panel With TSH  2. Other malaise and fatigue - Anemia Profile B - Vit D  25 hydroxy (rtn osteoporosis monitoring) - Thyroid Panel With TSH  3. Nausea alone -Avoid spicy foods and alcohol   4. Leg cramp -Can discussed if levels are good about discontinuing cholesterol levels - CMP14+EGFR - Lipid panel  Evelina Dun, FNP

## 2013-06-27 LAB — ANEMIA PROFILE B
BASOS ABS: 0 10*3/uL (ref 0.0–0.2)
Basos: 1 %
EOS ABS: 0.4 10*3/uL (ref 0.0–0.4)
Eos: 6 %
FERRITIN: 69 ng/mL (ref 30–400)
FOLATE: 6.3 ng/mL (ref >3.0)
HCT: 35.2 % — ABNORMAL LOW (ref 37.5–51.0)
Hemoglobin: 11.8 g/dL — ABNORMAL LOW (ref 12.6–17.7)
IMMATURE GRANS (ABS): 0 10*3/uL (ref 0.0–0.1)
IRON SATURATION: 16 % (ref 15–55)
IRON: 63 ug/dL (ref 40–155)
Immature Granulocytes: 0 %
LYMPHS: 24 %
Lymphocytes Absolute: 1.5 10*3/uL (ref 0.7–3.1)
MCH: 31.1 pg (ref 26.6–33.0)
MCHC: 33.5 g/dL (ref 31.5–35.7)
MCV: 93 fL (ref 79–97)
Monocytes Absolute: 0.8 10*3/uL (ref 0.1–0.9)
Monocytes: 13 %
NEUTROS PCT: 56 %
Neutrophils Absolute: 3.5 10*3/uL (ref 1.4–7.0)
PLATELETS: 227 10*3/uL (ref 150–379)
RBC: 3.79 x10E6/uL — ABNORMAL LOW (ref 4.14–5.80)
RDW: 13.4 % (ref 12.3–15.4)
RETIC CT PCT: 1.2 % (ref 0.6–2.6)
TIBC: 393 ug/dL (ref 250–450)
UIBC: 330 ug/dL (ref 150–375)
VITAMIN B 12: 314 pg/mL (ref 211–946)
WBC: 6.3 10*3/uL (ref 3.4–10.8)

## 2013-06-27 LAB — THYROID PANEL WITH TSH
FREE THYROXINE INDEX: 2.2 (ref 1.2–4.9)
T3 UPTAKE RATIO: 28 % (ref 24–39)
T4 TOTAL: 7.8 ug/dL (ref 4.5–12.0)
TSH: 0.832 u[IU]/mL (ref 0.450–4.500)

## 2013-06-27 LAB — CMP14+EGFR
A/G RATIO: 1.7 (ref 1.1–2.5)
ALK PHOS: 40 IU/L (ref 39–117)
ALT: 9 IU/L (ref 0–44)
AST: 17 IU/L (ref 0–40)
Albumin: 4.7 g/dL (ref 3.5–4.8)
BUN / CREAT RATIO: 9 — AB (ref 10–22)
BUN: 19 mg/dL (ref 8–27)
CO2: 25 mmol/L (ref 18–29)
Calcium: 11.1 mg/dL — ABNORMAL HIGH (ref 8.6–10.2)
Chloride: 101 mmol/L (ref 97–108)
Creatinine, Ser: 2.09 mg/dL — ABNORMAL HIGH (ref 0.76–1.27)
GFR calc Af Amer: 34 mL/min/{1.73_m2} — ABNORMAL LOW (ref >59)
GFR calc non Af Amer: 29 mL/min/{1.73_m2} — ABNORMAL LOW (ref >59)
Globulin, Total: 2.8 g/dL (ref 1.5–4.5)
Glucose: 129 mg/dL — ABNORMAL HIGH (ref 65–99)
Potassium: 5.7 mmol/L — ABNORMAL HIGH (ref 3.5–5.2)
SODIUM: 140 mmol/L (ref 134–144)
Total Bilirubin: 0.2 mg/dL (ref 0.0–1.2)
Total Protein: 7.5 g/dL (ref 6.0–8.5)

## 2013-06-27 LAB — LIPID PANEL
CHOL/HDL RATIO: 2.5 ratio (ref 0.0–5.0)
Cholesterol, Total: 148 mg/dL (ref 100–199)
HDL: 59 mg/dL (ref >39)
LDL Calculated: 59 mg/dL (ref 0–99)
TRIGLYCERIDES: 148 mg/dL (ref 0–149)
VLDL Cholesterol Cal: 30 mg/dL (ref 5–40)

## 2013-06-27 LAB — VITAMIN D 25 HYDROXY (VIT D DEFICIENCY, FRACTURES): VIT D 25 HYDROXY: 21.7 ng/mL — AB (ref 30.0–100.0)

## 2013-06-30 ENCOUNTER — Other Ambulatory Visit: Payer: Self-pay | Admitting: Family

## 2013-06-30 DIAGNOSIS — R7989 Other specified abnormal findings of blood chemistry: Secondary | ICD-10-CM

## 2013-06-30 DIAGNOSIS — R739 Hyperglycemia, unspecified: Secondary | ICD-10-CM

## 2013-06-30 NOTE — Progress Notes (Signed)
Pt aware of labs, will be in next Tuesday to have labwork redrawn and pickup FOB test kit

## 2013-07-07 ENCOUNTER — Other Ambulatory Visit (INDEPENDENT_AMBULATORY_CARE_PROVIDER_SITE_OTHER): Payer: Medicare HMO

## 2013-07-07 ENCOUNTER — Ambulatory Visit: Payer: Medicare HMO | Admitting: Physician Assistant

## 2013-07-07 DIAGNOSIS — R739 Hyperglycemia, unspecified: Secondary | ICD-10-CM

## 2013-07-07 DIAGNOSIS — Z1212 Encounter for screening for malignant neoplasm of rectum: Secondary | ICD-10-CM

## 2013-07-07 DIAGNOSIS — R7989 Other specified abnormal findings of blood chemistry: Secondary | ICD-10-CM

## 2013-07-07 DIAGNOSIS — R799 Abnormal finding of blood chemistry, unspecified: Secondary | ICD-10-CM

## 2013-07-07 DIAGNOSIS — R7309 Other abnormal glucose: Secondary | ICD-10-CM

## 2013-07-07 LAB — POCT CBC
Granulocyte percent: 79.6 %G (ref 37–80)
HCT, POC: 37.2 % — AB (ref 43.5–53.7)
Hemoglobin: 11.9 g/dL — AB (ref 14.1–18.1)
Lymph, poc: 1.2 (ref 0.6–3.4)
MCH, POC: 29.9 pg (ref 27–31.2)
MCHC: 32 g/dL (ref 31.8–35.4)
MCV: 93.5 fL (ref 80–97)
MPV: 8.4 fL (ref 0–99.8)
POC GRANULOCYTE: 5.1 (ref 2–6.9)
POC LYMPH %: 18.1 % (ref 10–50)
Platelet Count, POC: 201 10*3/uL (ref 142–424)
RBC: 4 M/uL — AB (ref 4.69–6.13)
RDW, POC: 13.3 %
WBC: 6.4 10*3/uL (ref 4.6–10.2)

## 2013-07-07 LAB — POCT GLYCOSYLATED HEMOGLOBIN (HGB A1C): Hemoglobin A1C: 5.2

## 2013-07-07 NOTE — Progress Notes (Signed)
Pt came in for labs only 

## 2013-07-08 LAB — CMP14+EGFR
A/G RATIO: 1.5 (ref 1.1–2.5)
ALBUMIN: 4.4 g/dL (ref 3.5–4.8)
ALT: 8 IU/L (ref 0–44)
AST: 10 IU/L (ref 0–40)
Alkaline Phosphatase: 30 IU/L — ABNORMAL LOW (ref 39–117)
BUN/Creatinine Ratio: 9 — ABNORMAL LOW (ref 10–22)
BUN: 18 mg/dL (ref 8–27)
CALCIUM: 10.7 mg/dL — AB (ref 8.6–10.2)
CO2: 23 mmol/L (ref 18–29)
Chloride: 100 mmol/L (ref 97–108)
Creatinine, Ser: 2.06 mg/dL — ABNORMAL HIGH (ref 0.76–1.27)
GFR calc Af Amer: 34 mL/min/{1.73_m2} — ABNORMAL LOW (ref >59)
GFR calc non Af Amer: 30 mL/min/{1.73_m2} — ABNORMAL LOW (ref >59)
GLOBULIN, TOTAL: 2.9 g/dL (ref 1.5–4.5)
Glucose: 83 mg/dL (ref 65–99)
Potassium: 4.7 mmol/L (ref 3.5–5.2)
Sodium: 138 mmol/L (ref 134–144)
TOTAL PROTEIN: 7.3 g/dL (ref 6.0–8.5)
Total Bilirubin: 0.4 mg/dL (ref 0.0–1.2)

## 2013-07-08 LAB — FECAL OCCULT BLOOD, IMMUNOCHEMICAL: FECAL OCCULT BLD: POSITIVE — AB

## 2013-07-10 ENCOUNTER — Telehealth: Payer: Self-pay | Admitting: Family

## 2013-07-10 ENCOUNTER — Other Ambulatory Visit: Payer: Self-pay | Admitting: Family

## 2013-07-10 DIAGNOSIS — R195 Other fecal abnormalities: Secondary | ICD-10-CM

## 2013-07-10 NOTE — Telephone Encounter (Signed)
Message copied by Cline Crock on Fri Jul 10, 2013  3:03 PM ------      Message from: [place], [place] A      Created: Fri Jul 10, 2013 11:05 AM       FOB positive- GI stat consult sent      Hgb & RBC-low ------

## 2013-07-11 NOTE — Telephone Encounter (Signed)
Patient aware of results.

## 2013-07-14 ENCOUNTER — Telehealth: Payer: Self-pay | Admitting: Family

## 2013-07-15 ENCOUNTER — Encounter: Payer: Self-pay | Admitting: Nurse Practitioner

## 2013-07-15 NOTE — Telephone Encounter (Signed)
[facility] to [phone]

## 2013-07-16 ENCOUNTER — Other Ambulatory Visit: Payer: Self-pay

## 2013-07-16 MED ORDER — AMLODIPINE BESYLATE 5 MG PO TABS: 5.0000 mg | ORAL_TABLET | Freq: Every day | ORAL | Status: DC

## 2013-07-22 ENCOUNTER — Other Ambulatory Visit: Payer: Self-pay | Admitting: Family Medicine

## 2013-07-22 ENCOUNTER — Encounter: Payer: Self-pay | Admitting: Nurse Practitioner

## 2013-07-22 ENCOUNTER — Ambulatory Visit (INDEPENDENT_AMBULATORY_CARE_PROVIDER_SITE_OTHER): Payer: Medicare HMO | Admitting: Nurse Practitioner

## 2013-07-22 VITALS — BP 110/60 | HR 66 | Ht 66.0 in | Wt 142.0 lb

## 2013-07-22 DIAGNOSIS — R195 Other fecal abnormalities: Secondary | ICD-10-CM

## 2013-07-22 DIAGNOSIS — R1310 Dysphagia, unspecified: Secondary | ICD-10-CM

## 2013-07-22 DIAGNOSIS — D649 Anemia, unspecified: Secondary | ICD-10-CM

## 2013-07-22 MED ORDER — NA SULFATE-K SULFATE-MG SULF 17.5-3.13-1.6 GM/177ML PO SOLN: 1.0000 | Freq: Once | ORAL | Status: AC

## 2013-07-22 NOTE — Patient Instructions (Signed)

## 2013-07-23 ENCOUNTER — Encounter: Payer: Self-pay | Admitting: Nurse Practitioner

## 2013-07-23 DIAGNOSIS — R1310 Dysphagia, unspecified: Secondary | ICD-10-CM | POA: Insufficient documentation

## 2013-07-23 DIAGNOSIS — D649 Anemia, unspecified: Secondary | ICD-10-CM | POA: Insufficient documentation

## 2013-07-23 DIAGNOSIS — R195 Other fecal abnormalities: Secondary | ICD-10-CM | POA: Insufficient documentation

## 2013-07-23 NOTE — Progress Notes (Signed)
HPI :  Patient is a 80 year old male referred by PCP for evaluation of anemia and heme positive stools. Over last 4 months hemoglobin has drifted from 13.6 to 11.9 . MCV is normal.  Recent IFOB was positive. No abdominal pain. No overt bleeding. Patient does have a history of throat cancer approximately 5 years ago and is status post chemoradiation . He has chronic dysphasia and underwent EGD with Maloney dilation of a stricture in 2010 in [place], [place]. Chronic dysphasia is stable  Patient denies bowel changes. He lost 40 pounds over the last few years but weight has been stable over the last several months. He has occasional nausea. Patient does take a daily full aspirin. He has never had a colonoscopy.   Past Medical History  Diagnosis Date  . Hyperlipidemia   . Hypertension   . Chronic pain   . Insomnia   . Varicose veins   . Cancer     In his neck area  . Anxiety   . Arthritis     neck and lumbar spine per pt  . Alcoholism   . Glaucoma     per pt    Family History  Problem Relation Age of Onset  . Colon cancer Neg Hx   . Diabetes Son    History  Substance Use Topics  . Smoking status: Current Some Day Smoker -- 1.00 packs/day    Types: Cigarettes  . Smokeless tobacco: Not on file     Comment: tobacco info given 07/22/13  . Alcohol Use: No   Current Outpatient Prescriptions  Medication Sig Dispense Refill  . amLODipine (NORVASC) 5 MG tablet Take 1 tablet (5 mg total) by mouth daily.  30 tablet  4  . aspirin 325 MG tablet Take 325 mg by mouth daily.      . busPIRone (BUSPAR) 5 MG tablet Take 1 tablet (5 mg total) by mouth 3 (three) times daily.  90 tablet  1  . fenofibrate 160 MG tablet Take 1 tablet (160 mg total) by mouth daily.  30 tablet  4  . levothyroxine (SYNTHROID, LEVOTHROID) 75 MCG tablet Take 1 tablet (75 mcg total) by mouth daily before breakfast.  30 tablet  6  . meclizine (ANTIVERT) 25 MG tablet Take 25 mg by mouth 2 (two) times daily.      .  ondansetron (ZOFRAN) 4 MG tablet Take 1 tablet (4 mg total) by mouth every 8 (eight) hours as needed for nausea or vomiting.  30 tablet  0  . pravastatin (PRAVACHOL) 40 MG tablet Take 1 tablet (40 mg total) by mouth daily.  30 tablet  1  . temazepam (RESTORIL) 30 MG capsule TAKE ONE CAPSULE BY MOUTH ONCE DAILY AT BEDTIME  30 capsule  0  . Na Sulfate-K Sulfate-Mg Sulf SOLN Take 1 kit by mouth once.  354 mL  0   No current facility-administered medications for this visit.   Allergies  Allergen Reactions  . Chantix [Varenicline] Other (See Comments)    Sleep walking      Review of Systems: Positive for anxiety, arthritis, back pain, vision changes, confusion, fatigue, itching, muscle pain, skin rash, sleeping problems, excessive thirst and excessive urination. All other systems reviewed and negative except where noted in HPI.   Physical Exam: BP 110/60  Pulse 66  Ht _0  (1.676 m)  Wt 142 lb (64.411 kg)  BMI 22.93 kg/m2 Constitutional: Pleasant,well-developed, white male in no acute distress. HEENT: Normocephalic and atraumatic. Conjunctivae are normal.  No scleral icterus. Neck supple.  Cardiovascular: Normal rate, regular rhythm.  Pulmonary/chest: Effort normal and breath sounds normal. No wheezing, rales or rhonchi. Abdominal: Soft, nondistended, nontender. Bowel sounds active throughout. There are no masses palpable. No hepatomegaly. Extremities: no edema Lymphadenopathy: No cervical adenopathy noted. Neurological: Alert and oriented to person place and time. Skin: Skin is warm and dry. No rashes noted. Psychiatric: Normal mood and affect. Behavior is normal.   ASSESSMENT AND PLAN:  87. 80 year old male with a mild normocytic anemia. Hemoglobin has declined almost 2 g over the last 4 months. Patient appears to have chronic kidney disease which could be contributing to anemia though he is IFOB positive. For further evaluation patient will be scheduled for a colonoscopy. Even  though IFOB suggests colonic source of blood loss I talked to patient about upper endoscopy since he does take NSAIDs and also because of the chronic dysphasia. The risks of both procedures including possible esophageal dilation and polypectomy were discussed with the patient and he agrees to proceed  2. CKD. Patient scheduled to see a nephrologist next week  3. history of throat cancer, status post chemotherapy and radiation. Patient tells me he is in remission.  4. HTN  5. Anxiety, on Buspar

## 2013-07-24 NOTE — Telephone Encounter (Signed)
Only seen [place] and [employer]

## 2013-07-24 NOTE — Telephone Encounter (Signed)
Called in.

## 2013-07-24 NOTE — Telephone Encounter (Signed)
Please call in temazepam with 0 refills

## 2013-07-24 NOTE — Progress Notes (Signed)
Reviewed and agree with management. Alsha Meland D. Angela Platner, M.D., FACG  

## 2013-08-03 ENCOUNTER — Telehealth: Payer: Self-pay | Admitting: *Deleted

## 2013-08-03 MED ORDER — TRAMADOL HCL 50 MG PO TABS: 50.0000 mg | ORAL_TABLET | Freq: Two times a day (BID) | ORAL | Status: DC | PRN

## 2013-08-03 NOTE — Telephone Encounter (Signed)
Received fax from pharmacy requesting a refill for Tramadol 50mg  take one tab po q 8-12 hrs prn #90. Do not see on current med list. Per fax the last time patient had this filled was on 03-26-12. Please advise. If approved please print and route to Pool [facility] so nurse can call patient to pick up

## 2013-08-04 NOTE — Telephone Encounter (Signed)
Tried to call patient no answer Rx up front for patient to pick up

## 2013-08-14 ENCOUNTER — Other Ambulatory Visit: Payer: Self-pay | Admitting: *Deleted

## 2013-08-14 MED ORDER — BUSPIRONE HCL 5 MG PO TABS: 5.0000 mg | ORAL_TABLET | Freq: Three times a day (TID) | ORAL | Status: DC

## 2013-08-17 ENCOUNTER — Other Ambulatory Visit: Payer: Self-pay | Admitting: *Deleted

## 2013-08-17 DIAGNOSIS — E785 Hyperlipidemia, unspecified: Secondary | ICD-10-CM

## 2013-08-17 MED ORDER — FENOFIBRATE 160 MG PO TABS: 160.0000 mg | ORAL_TABLET | Freq: Every day | ORAL | Status: DC

## 2013-08-18 ENCOUNTER — Other Ambulatory Visit: Payer: Self-pay | Admitting: Nephrology

## 2013-08-18 DIAGNOSIS — N183 Chronic kidney disease, stage 3 unspecified: Secondary | ICD-10-CM

## 2013-08-24 ENCOUNTER — Other Ambulatory Visit: Payer: Self-pay | Admitting: Nurse Practitioner

## 2013-08-25 NOTE — Telephone Encounter (Signed)
Called in.

## 2013-08-25 NOTE — Telephone Encounter (Signed)
Please call in temazepam with 1 refills

## 2013-08-25 NOTE — Telephone Encounter (Signed)
Last ov 6/15. Last rf 07/24/13. If approved send to pool to call in to [facility].

## 2013-08-26 ENCOUNTER — Other Ambulatory Visit: Payer: Medicare HMO

## 2013-09-01 ENCOUNTER — Ambulatory Visit
Admission: RE | Admit: 2013-09-01 | Discharge: 2013-09-01 | Disposition: A | Discharge status: Home / Self Care | Payer: Commercial Managed Care - HMO | Source: Ambulatory Visit | Attending: Nephrology | Admitting: Nephrology

## 2013-09-01 DIAGNOSIS — N183 Chronic kidney disease, stage 3 unspecified: Secondary | ICD-10-CM

## 2013-09-04 ENCOUNTER — Telehealth: Payer: Self-pay | Admitting: Gastroenterology

## 2013-09-04 NOTE — Telephone Encounter (Signed)
yes

## 2013-09-07 ENCOUNTER — Encounter: Payer: Medicare HMO | Admitting: Gastroenterology

## 2013-09-16 ENCOUNTER — Other Ambulatory Visit: Payer: Self-pay | Admitting: *Deleted

## 2013-09-16 DIAGNOSIS — E039 Hypothyroidism, unspecified: Secondary | ICD-10-CM

## 2013-09-16 MED ORDER — LEVOTHYROXINE SODIUM 75 MCG PO TABS: 75.0000 ug | ORAL_TABLET | Freq: Every day | ORAL | Status: DC

## 2013-11-16 ENCOUNTER — Other Ambulatory Visit: Payer: Self-pay | Admitting: *Deleted

## 2013-11-16 DIAGNOSIS — E785 Hyperlipidemia, unspecified: Secondary | ICD-10-CM

## 2013-11-16 MED ORDER — PRAVASTATIN SODIUM 40 MG PO TABS: 40.0000 mg | ORAL_TABLET | Freq: Every day | ORAL | Status: DC

## 2013-12-13 ENCOUNTER — Other Ambulatory Visit: Payer: Self-pay | Admitting: Family

## 2014-01-18 ENCOUNTER — Other Ambulatory Visit: Payer: Self-pay | Admitting: Family

## 2014-01-18 ENCOUNTER — Other Ambulatory Visit: Payer: Self-pay | Admitting: Family Medicine

## 2014-02-15 ENCOUNTER — Other Ambulatory Visit: Payer: Self-pay | Admitting: *Deleted

## 2014-02-15 MED ORDER — PRAVASTATIN SODIUM 40 MG PO TABS: 40.0000 mg | ORAL_TABLET | Freq: Every day | ORAL | Status: DC

## 2014-02-17 ENCOUNTER — Other Ambulatory Visit: Payer: Self-pay | Admitting: Family Medicine

## 2014-02-22 ENCOUNTER — Ambulatory Visit: Payer: Medicare HMO | Admitting: Family

## 2014-02-23 ENCOUNTER — Telehealth: Payer: Self-pay | Admitting: Family Medicine

## 2014-02-23 DIAGNOSIS — E039 Hypothyroidism, unspecified: Secondary | ICD-10-CM

## 2014-02-23 MED ORDER — LEVOTHYROXINE SODIUM 75 MCG PO TABS: 75.0000 ug | ORAL_TABLET | Freq: Every day | ORAL | Status: DC

## 2014-02-23 MED ORDER — PRAVASTATIN SODIUM 40 MG PO TABS: 40.0000 mg | ORAL_TABLET | Freq: Every day | ORAL | Status: DC

## 2014-02-23 NOTE — Telephone Encounter (Signed)
Patients family aware rx sent into pharamacy.

## 2014-03-05 ENCOUNTER — Ambulatory Visit: Payer: Medicare HMO | Admitting: Family Medicine

## 2014-03-15 ENCOUNTER — Encounter: Payer: Self-pay | Admitting: Family Medicine

## 2014-03-15 ENCOUNTER — Ambulatory Visit (INDEPENDENT_AMBULATORY_CARE_PROVIDER_SITE_OTHER): Payer: Commercial Managed Care - HMO | Admitting: Family Medicine

## 2014-03-15 VITALS — BP 189/90 | HR 57 | Temp 97.0°F | Ht 66.0 in | Wt 145.2 lb

## 2014-03-15 DIAGNOSIS — F411 Generalized anxiety disorder: Secondary | ICD-10-CM | POA: Diagnosis not present

## 2014-03-15 DIAGNOSIS — N529 Male erectile dysfunction, unspecified: Secondary | ICD-10-CM

## 2014-03-15 DIAGNOSIS — Z23 Encounter for immunization: Secondary | ICD-10-CM

## 2014-03-15 DIAGNOSIS — E785 Hyperlipidemia, unspecified: Secondary | ICD-10-CM | POA: Diagnosis not present

## 2014-03-15 DIAGNOSIS — E039 Hypothyroidism, unspecified: Secondary | ICD-10-CM

## 2014-03-15 DIAGNOSIS — F17200 Nicotine dependence, unspecified, uncomplicated: Secondary | ICD-10-CM | POA: Diagnosis not present

## 2014-03-15 DIAGNOSIS — I10 Essential (primary) hypertension: Secondary | ICD-10-CM | POA: Diagnosis not present

## 2014-03-15 DIAGNOSIS — F5101 Primary insomnia: Secondary | ICD-10-CM

## 2014-03-15 LAB — POCT CBC
Granulocyte percent: 71.4 % (ref 37–80)
HCT, POC: 45.2 % (ref 43.5–53.7)
Hemoglobin: 14.6 g/dL (ref 14.1–18.1)
Lymph, poc: 1.9 (ref 0.6–3.4)
MCH, POC: 30.9 pg (ref 27–31.2)
MCHC: 32.3 g/dL (ref 31.8–35.4)
MCV: 95.7 fL (ref 80–97)
MPV: 8 fL (ref 0–99.8)
POC Granulocyte: 7.1 — AB (ref 2–6.9)
POC LYMPH PERCENT: 18.5 % (ref 10–50)
Platelet Count, POC: 233 10*3/uL (ref 142–424)
RBC: 4.72 M/uL (ref 4.69–6.13)
RDW, POC: 13.3 %
WBC: 10 10*3/uL (ref 4.6–10.2)

## 2014-03-15 MED ORDER — TEMAZEPAM 30 MG PO CAPS: 30.0000 mg | ORAL_CAPSULE | Freq: Every day | ORAL | Status: DC

## 2014-03-15 MED ORDER — VARENICLINE TARTRATE 0.5 MG X 11 & 1 MG X 42 PO MISC: ORAL | Status: DC

## 2014-03-15 MED ORDER — TADALAFIL 20 MG PO TABS: 10.0000 mg | ORAL_TABLET | ORAL | Status: DC | PRN

## 2014-03-15 MED ORDER — BUSPIRONE HCL 5 MG PO TABS: 5.0000 mg | ORAL_TABLET | Freq: Three times a day (TID) | ORAL | Status: DC

## 2014-03-15 MED ORDER — AMLODIPINE BESYLATE 5 MG PO TABS: 5.0000 mg | ORAL_TABLET | Freq: Every day | ORAL | Status: DC

## 2014-03-15 NOTE — Progress Notes (Signed)
Subjective:  Patient ID: Kevin Ho, male    DOB: 1931-01-21  Age: 81 y.o. MRN: [id number]  CC: Hypertension; Hyperlipidemia; and Hypothyroidism   HPI Alyas Creary Christenson presents for Patient presents for follow-up on  thyroid. She has a history of hypothyroidism for many years. It has been stable recently. Pt. denies any change in  voice, loss of hair, heat or cold intolerance. Energy level has been adequate to good. She denies constipation and diarrhea. No myxedema. Medication is as noted below. Verified that pt is taking it daily on an empty stomach. Well tolerated.  Patient in for follow-up of elevated cholesterol. Doing well without complaints on current medication. Denies side effects of statin including myalgia and arthralgia and nausea. Also in today for liver function testing. Currently no chest pain, shortness of breath or other cardiovascular related symptoms noted.  Patient in for follow-up of hypertension. Patient has no history of headache chest pain or shortness of breath or recent cough. Patient also denies symptoms of TIA such as numbness weakness lateralizing. Patient does not check  blood pressure at home. Patient denies side effects from his medication. States out of medicine for approximately one month.  Patient has had problems with chronic anxiety and takes buspirone for that. He requires temazepam to help with that at bedtime. Without the temazepam he does not sleep well.  Patient also complains of erectile dysfunction and requests Cialis.  Patient states that he would quit smoking if he didn't have to take so many medicines. He feels that he could stop if he took Chantix. However in the past even though he quit with this it made him Crazy and he quit taking it after a short time. He feels that he could not tolerate Chantix at this time simply because he's taking so many medications.   History Chaise has a past medical history of Hyperlipidemia; Hypertension; Chronic  pain; Insomnia; Varicose veins; Cancer; Anxiety; Arthritis; Alcoholism; and Glaucoma.   He has past surgical history that includes right inguinal hernia and porta cath.   His family history includes Diabetes in his son. There is no history of Colon cancer.He reports that he has been smoking Cigarettes.  He has been smoking about 1.00 pack per day. He does not have any smokeless tobacco history on file. He reports that he does not drink alcohol or use illicit drugs.  Current Outpatient Prescriptions on File Prior to Visit  Medication Sig Dispense Refill  . levothyroxine (SYNTHROID, LEVOTHROID) 75 MCG tablet Take 1 tablet (75 mcg total) by mouth daily before breakfast. 30 tablet 0   No current facility-administered medications on file prior to visit.    ROS Review of Systems  Constitutional: Negative for fever, chills, diaphoresis and unexpected weight change.  HENT: Negative for congestion, hearing loss, rhinorrhea, sore throat and trouble swallowing.   Respiratory: Negative for cough, chest tightness, shortness of breath and wheezing.   Gastrointestinal: Negative for nausea, vomiting, abdominal pain, diarrhea, constipation and abdominal distention.  Endocrine: Negative for cold intolerance and heat intolerance.  Genitourinary: Negative for dysuria, hematuria and flank pain.  Musculoskeletal: Negative for joint swelling and arthralgias.  Skin: Negative for rash.  Neurological: Negative for dizziness and headaches.  Psychiatric/Behavioral: Negative for dysphoric mood, decreased concentration and agitation. The patient is not nervous/anxious.     Objective:  BP 189/90 mmHg  Pulse 57  Temp(Src) 97 F (36.1 C) (Oral)  Ht 5\' 6"  (1.676 m)  Wt 145 lb 3.2 oz (65.862 kg)  BMI  23.45 kg/m2  BP Readings from Last 3 Encounters:  03/15/14 189/90  07/22/13 110/60  06/26/13 138/70    Wt Readings from Last 3 Encounters:  03/15/14 145 lb 3.2 oz (65.862 kg)  07/22/13 142 lb (64.411 kg)    06/26/13 141 lb 3.2 oz (64.048 kg)     Physical Exam  Constitutional: He is oriented to person, place, and time. He appears well-developed and well-nourished. No distress.  HENT:  Head: Normocephalic and atraumatic.  Right Ear: External ear normal.  Left Ear: External ear normal.  Nose: Nose normal.  Mouth/Throat: Oropharynx is clear and moist.  Eyes: Conjunctivae and EOM are normal. Pupils are equal, round, and reactive to light.  Neck: Normal range of motion. Neck supple. No thyromegaly present.  Cardiovascular: Normal rate, regular rhythm and normal heart sounds.   No murmur heard. Pulmonary/Chest: Effort normal and breath sounds normal. No respiratory distress. He has no wheezes. He has no rales.  Abdominal: Soft. Bowel sounds are normal. He exhibits no distension. There is no tenderness.  Lymphadenopathy:    He has no cervical adenopathy.  Neurological: He is alert and oriented to person, place, and time. He has normal reflexes.  Skin: Skin is warm and dry.  Psychiatric: He has a normal mood and affect. His behavior is normal. Judgment and thought content normal.    Lab Results  Component Value Date   HGBA1C 5.2 07/07/2013    Lab Results  Component Value Date   WBC 10.0 03/15/2014   HGB 14.6 03/15/2014   HCT 45.2 03/15/2014   PLT 227 06/26/2013   GLUCOSE 83 07/07/2013   CHOL 148 06/26/2013   TRIG 148 06/26/2013   HDL 59 06/26/2013   LDLCALC 59 06/26/2013   ALT 8 07/07/2013   AST 10 07/07/2013   NA 138 07/07/2013   K 4.7 07/07/2013   CL 100 07/07/2013   CREATININE 2.06* 07/07/2013   BUN 18 07/07/2013   CO2 23 07/07/2013   TSH 0.832 06/26/2013   HGBA1C 5.2 07/07/2013    US Renal  09/01/2013   CLINICAL DATA:  Chronic kidney disease.  EXAM: RENAL/URINARY TRACT ULTRASOUND COMPLETE  COMPARISON:  None.  FINDINGS: Right Kidney:  Length: 10.5 cm. 9 mm cyst seen in upper pole. Increased echogenicity of renal parenchyma is noted suggesting medical renal disease. No  mass or hydronephrosis visualized.  Left Kidney:  Length: 10.5 cm. 7 mm cyst is seen in upper pole. 9 mm cyst is seen in midpole. Increased echogenicity of renal per is noted suggesting medical renal disease. No mass or hydronephrosis visualized.  Bladder:  Appears normal for degree of bladder distention. Mild prostatic enlargement is noted.  IMPRESSION: Increased echogenicity of renal parenchyma is noted bilaterally suggesting medical renal disease. Small bilateral renal cysts are noted. No hydronephrosis or renal obstruction is noted. Mild prostatic enlargement.   Electronically Signed   By: Sabino Dick M.D.   On: 09/01/2013 12:58    Assessment & Plan:   Pearlie was seen today for hypertension, hyperlipidemia and hypothyroidism.  Diagnoses and all orders for this visit:  Hypertension, accelerated Orders: -     amLODipine (NORVASC) 5 MG tablet; Take 1 tablet (5 mg total) by mouth daily. For  blood presssure  Hyperlipemia Orders: -     CMP14+EGFR -     Lipid panel  Essential hypertension, benign Orders: -     POCT CBC -     CMP14+EGFR  Hypothyroidism, unspecified hypothyroidism type Orders: -  TSH -     T4, Free  Smoking addiction Orders: -     varenicline (CHANTIX STARTING MONTH PAK) 0.5 MG X 11 & 1 MG X 42 tablet; Take one 0.5 mg tablet by mouth once daily for 3 days, then increase to one 0.5 mg tablet twice daily for 4 days, then increase to one 1 mg tablet twice daily.  Erectile dysfunction, unspecified erectile dysfunction type Orders: -     tadalafil (CIALIS) 20 MG tablet; Take 0.5-1 tablets (10-20 mg total) by mouth every other day as needed for erectile dysfunction.  Generalized anxiety disorder Orders: -     busPIRone (BUSPAR) 5 MG tablet; Take 1 tablet (5 mg total) by mouth 3 (three) times daily. For nerves  Insomnia, idiopathic Orders: -     temazepam (RESTORIL) 30 MG capsule; Take 1 capsule (30 mg total) by mouth at bedtime. To help with nerves and with  sleep  Other orders -     Discontinue: temazepam (RESTORIL) 30 MG capsule; Take 1 capsule (30 mg total) by mouth at bedtime. To help with nerves and with sleep -     Pneumococcal conjugate vaccine 13-valent   I have discontinued Mr. Feely's aspirin, meclizine, ondansetron, traMADol, fenofibrate, and pravastatin. I have also changed his amLODipine and busPIRone. Additionally, I am having him start on tadalafil and varenicline. Lastly, I am having him maintain his levothyroxine and temazepam.  Meds ordered this encounter  Medications  . tadalafil (CIALIS) 20 MG tablet    Sig: Take 0.5-1 tablets (10-20 mg total) by mouth every other day as needed for erectile dysfunction.    Dispense:  5 tablet    Refill:  11  . varenicline (CHANTIX STARTING MONTH PAK) 0.5 MG X 11 & 1 MG X 42 tablet    Sig: Take one 0.5 mg tablet by mouth once daily for 3 days, then increase to one 0.5 mg tablet twice daily for 4 days, then increase to one 1 mg tablet twice daily.    Dispense:  53 tablet    Refill:  0  . amLODipine (NORVASC) 5 MG tablet    Sig: Take 1 tablet (5 mg total) by mouth daily. For  blood presssure    Dispense:  30 tablet    Refill:  5    Needs to be seen before next refill  . busPIRone (BUSPAR) 5 MG tablet    Sig: Take 1 tablet (5 mg total) by mouth 3 (three) times daily. For nerves    Dispense:  90 tablet    Refill:  1  . DISCONTD: temazepam (RESTORIL) 30 MG capsule    Sig: Take 1 capsule (30 mg total) by mouth at bedtime. To help with nerves and with sleep    Dispense:  30 capsule    Refill:  0  . temazepam (RESTORIL) 30 MG capsule    Sig: Take 1 capsule (30 mg total) by mouth at bedtime. To help with nerves and with sleep    Dispense:  30 capsule    Refill:  0    Comments: In order to convince the patient the need to stop smoking I have agreed to temporarily discontinuing multiple medications. These include his cholesterol medications. However I have reemphasized the importance of  taking his amlodipine and his levothyroxine. Once he has discontinued smoking and finished course of Chantix at that time it will be worthwhile to start him back on his anti-hyperlipidemic.   Follow-up: Return in about 2 weeks (around 03/29/2014).  Krosby Ritchie, M.D.  

## 2014-03-16 ENCOUNTER — Telehealth: Payer: Self-pay | Admitting: Family

## 2014-03-16 ENCOUNTER — Other Ambulatory Visit: Payer: Self-pay | Admitting: *Deleted

## 2014-03-16 DIAGNOSIS — E875 Hyperkalemia: Secondary | ICD-10-CM

## 2014-03-16 LAB — CMP14+EGFR
ALT: 17 IU/L (ref 0–44)
AST: 19 IU/L (ref 0–40)
Albumin/Globulin Ratio: 1.6 (ref 1.1–2.5)
Albumin: 4.9 g/dL — ABNORMAL HIGH (ref 3.5–4.7)
Alkaline Phosphatase: 57 IU/L (ref 39–117)
BILIRUBIN TOTAL: 0.5 mg/dL (ref 0.0–1.2)
BUN/Creatinine Ratio: 10 (ref 10–22)
BUN: 14 mg/dL (ref 8–27)
CO2: 22 mmol/L (ref 18–29)
Calcium: 11.1 mg/dL — ABNORMAL HIGH (ref 8.6–10.2)
Chloride: 100 mmol/L (ref 97–108)
Creatinine, Ser: 1.44 mg/dL — ABNORMAL HIGH (ref 0.76–1.27)
GFR calc Af Amer: 53 mL/min/{1.73_m2} — ABNORMAL LOW (ref >59)
GFR calc non Af Amer: 46 mL/min/{1.73_m2} — ABNORMAL LOW (ref >59)
GLOBULIN, TOTAL: 3.1 g/dL (ref 1.5–4.5)
Glucose: 105 mg/dL — ABNORMAL HIGH (ref 65–99)
POTASSIUM: 6.2 mmol/L — AB (ref 3.5–5.2)
SODIUM: 140 mmol/L (ref 134–144)
Total Protein: 8 g/dL (ref 6.0–8.5)

## 2014-03-16 LAB — LIPID PANEL
CHOL/HDL RATIO: 3.6 ratio (ref 0.0–5.0)
Cholesterol, Total: 218 mg/dL — ABNORMAL HIGH (ref 100–199)
HDL: 60 mg/dL (ref >39)
LDL Calculated: 104 mg/dL — ABNORMAL HIGH (ref 0–99)
TRIGLYCERIDES: 272 mg/dL — AB (ref 0–149)
VLDL Cholesterol Cal: 54 mg/dL — ABNORMAL HIGH (ref 5–40)

## 2014-03-16 LAB — TSH: TSH: 2.53 u[IU]/mL (ref 0.450–4.500)

## 2014-03-16 LAB — T4, FREE: Free T4: 1.5 ng/dL (ref 0.82–1.77)

## 2014-03-16 NOTE — Telephone Encounter (Signed)
Reviewed results with pt's daughter in law.

## 2014-03-22 ENCOUNTER — Other Ambulatory Visit (INDEPENDENT_AMBULATORY_CARE_PROVIDER_SITE_OTHER): Payer: Commercial Managed Care - HMO

## 2014-03-22 DIAGNOSIS — E875 Hyperkalemia: Secondary | ICD-10-CM | POA: Diagnosis not present

## 2014-03-22 LAB — CMP14+EGFR
A/G RATIO: 1.6 (ref 1.1–2.5)
ALT: 13 IU/L (ref 0–44)
AST: 20 IU/L (ref 0–40)
Albumin: 4.6 g/dL (ref 3.5–4.7)
Alkaline Phosphatase: 55 IU/L (ref 39–117)
BUN/Creatinine Ratio: 12 (ref 10–22)
BUN: 15 mg/dL (ref 8–27)
Bilirubin Total: 0.4 mg/dL (ref 0.0–1.2)
CALCIUM: 10.9 mg/dL — AB (ref 8.6–10.2)
CO2: 28 mmol/L (ref 18–29)
CREATININE: 1.21 mg/dL (ref 0.76–1.27)
Chloride: 103 mmol/L (ref 96–108)
GFR calc Af Amer: 65 mL/min/{1.73_m2} (ref >59)
GFR calc non Af Amer: 56 mL/min/{1.73_m2} — ABNORMAL LOW (ref >59)
GLOBULIN, TOTAL: 2.8 g/dL (ref 1.5–4.5)
GLUCOSE: 119 mg/dL — AB (ref 65–99)
Potassium: 5.6 mmol/L — ABNORMAL HIGH (ref 3.5–5.2)
Sodium: 142 mmol/L (ref 134–144)
TOTAL PROTEIN: 7.4 g/dL (ref 6.0–8.5)

## 2014-03-22 LAB — SPECIMEN STATUS REPORT

## 2014-03-22 NOTE — Progress Notes (Signed)
Lab only 

## 2014-03-23 NOTE — Addendum Note (Signed)
Addended by: Thana Ates on: 03/23/2014 09:37 AM   Modules accepted: Orders

## 2014-04-14 ENCOUNTER — Other Ambulatory Visit: Payer: Self-pay | Admitting: *Deleted

## 2014-04-14 DIAGNOSIS — F5101 Primary insomnia: Secondary | ICD-10-CM

## 2014-04-14 MED ORDER — TEMAZEPAM 30 MG PO CAPS: 30.0000 mg | ORAL_CAPSULE | Freq: Every day | ORAL | Status: DC

## 2014-04-14 NOTE — Telephone Encounter (Signed)
Patient last seen in office on 03-15-14. Rx last filled on 03-15-14 for #30. Please advise. If approved please route to Pool B so nurse can phone in to [facility] in [place]

## 2014-04-15 DIAGNOSIS — H2589 Other age-related cataract: Secondary | ICD-10-CM | POA: Diagnosis not present

## 2014-04-16 ENCOUNTER — Telehealth: Payer: Self-pay | Admitting: Family Medicine

## 2014-04-16 NOTE — Telephone Encounter (Signed)
Called meds to [facility]. Patient notified

## 2014-04-22 ENCOUNTER — Telehealth: Payer: Self-pay | Admitting: Family Medicine

## 2014-04-22 DIAGNOSIS — H269 Unspecified cataract: Secondary | ICD-10-CM

## 2014-04-22 NOTE — Telephone Encounter (Signed)
Referral has been made.

## 2014-05-11 ENCOUNTER — Other Ambulatory Visit: Payer: Self-pay

## 2014-05-11 DIAGNOSIS — F5101 Primary insomnia: Secondary | ICD-10-CM

## 2014-05-11 NOTE — Telephone Encounter (Signed)
Last seen 03/15/14 Dr Livia Snellen  If approved route to nurse to call into [facility]

## 2014-05-12 ENCOUNTER — Ambulatory Visit: Payer: Commercial Managed Care - HMO | Admitting: Family Medicine

## 2014-05-12 ENCOUNTER — Telehealth: Payer: Self-pay | Admitting: Family Medicine

## 2014-05-12 NOTE — Telephone Encounter (Signed)
Appt made for this afternoon to evaluate if dizziness is from medication, to see if can change dosage time and to refill this medication

## 2014-05-13 MED ORDER — TEMAZEPAM 30 MG PO CAPS: 30.0000 mg | ORAL_CAPSULE | Freq: Every day | ORAL | Status: DC

## 2014-05-13 NOTE — Telephone Encounter (Signed)
Please review and advise.

## 2014-05-14 NOTE — Telephone Encounter (Signed)
RX called into [facility]

## 2014-05-17 ENCOUNTER — Telehealth: Payer: Self-pay | Admitting: Family Medicine

## 2014-05-18 NOTE — Telephone Encounter (Signed)
Pt notified RX called into [facility] Verbalizes understanding

## 2014-06-08 ENCOUNTER — Telehealth: Payer: Self-pay | Admitting: Family Medicine

## 2014-06-08 NOTE — Telephone Encounter (Signed)
Please let the patient know that he will need to be seen before any further refills after this month. Also let him know that I'm very concerned about his blood pressure it was very high when he was in. He is overdue for a recheck of that

## 2014-06-09 NOTE — Telephone Encounter (Signed)
Pt notified of RX and his NT[employer] Verbalizes understanding Will have daughter in law call to schedule appt

## 2014-06-18 ENCOUNTER — Ambulatory Visit (INDEPENDENT_AMBULATORY_CARE_PROVIDER_SITE_OTHER): Payer: Commercial Managed Care - HMO | Admitting: Physician Assistant

## 2014-06-18 ENCOUNTER — Encounter: Payer: Self-pay | Admitting: Physician Assistant

## 2014-06-18 VITALS — BP 147/78 | HR 76 | Temp 97.0°F | Ht 66.0 in | Wt 145.0 lb

## 2014-06-18 DIAGNOSIS — F411 Generalized anxiety disorder: Secondary | ICD-10-CM

## 2014-06-18 DIAGNOSIS — E785 Hyperlipidemia, unspecified: Secondary | ICD-10-CM

## 2014-06-18 DIAGNOSIS — E039 Hypothyroidism, unspecified: Secondary | ICD-10-CM

## 2014-06-18 DIAGNOSIS — I10 Essential (primary) hypertension: Secondary | ICD-10-CM | POA: Diagnosis not present

## 2014-06-18 DIAGNOSIS — Z125 Encounter for screening for malignant neoplasm of prostate: Secondary | ICD-10-CM | POA: Diagnosis not present

## 2014-06-18 DIAGNOSIS — N183 Chronic kidney disease, stage 3 unspecified: Secondary | ICD-10-CM

## 2014-06-18 DIAGNOSIS — F5101 Primary insomnia: Secondary | ICD-10-CM | POA: Diagnosis not present

## 2014-06-18 MED ORDER — LEVOTHYROXINE SODIUM 75 MCG PO TABS: 75.0000 ug | ORAL_TABLET | Freq: Every day | ORAL | Status: DC

## 2014-06-18 MED ORDER — AMLODIPINE BESYLATE 5 MG PO TABS: 5.0000 mg | ORAL_TABLET | Freq: Every day | ORAL | Status: DC

## 2014-06-18 MED ORDER — BUSPIRONE HCL 5 MG PO TABS: 5.0000 mg | ORAL_TABLET | Freq: Three times a day (TID) | ORAL | Status: DC

## 2014-06-18 MED ORDER — TEMAZEPAM 30 MG PO CAPS: 30.0000 mg | ORAL_CAPSULE | Freq: Every day | ORAL | Status: DC

## 2014-06-18 NOTE — Progress Notes (Signed)
Subjective:    Patient ID: Kevin Ho, male    DOB: June 25, 1931, 81 y.o.   MRN: [id number]  HPI 81 year old male with numerous comorbidities presents for medication refills. He is not fasting      Review of Systems  Constitutional: Negative.   HENT: Negative.   Respiratory: Negative.   Cardiovascular: Negative.   Gastrointestinal: Positive for nausea.  Endocrine: Negative.  Negative for polyuria.  Genitourinary: Positive for frequency.  Neurological: Positive for light-headedness.       Objective:   Physical Exam  Constitutional: He is oriented to person, place, and time. He appears well-developed and well-nourished. No distress.  Cardiovascular: Normal rate, regular rhythm, normal heart sounds and intact distal pulses.  Exam reveals no gallop and no friction rub.   No murmur heard. Pulmonary/Chest: Effort normal. He has wheezes (mild generalized expiratory wheezing ).  Musculoskeletal: He exhibits no edema or tenderness.  Neurological: He is alert and oriented to person, place, and time.  Skin: He is not diaphoretic.  Psychiatric: He has a normal mood and affect. His behavior is normal. Judgment and thought content normal.  Nursing note and vitals reviewed.         Assessment & Plan:  1. Hypothyroidism, unspecified hypothyroidism type  - POCT CBC - Lipid panel - CMP14+EGFR - Thyroid Panel With TSH - PSA, total and free - amLODipine (NORVASC) 5 MG tablet; Take 1 tablet (5 mg total) by mouth daily. For  blood presssure  Dispense: 30 tablet; Refill: 5 - busPIRone (BUSPAR) 5 MG tablet; Take 1 tablet (5 mg total) by mouth 3 (three) times daily. For nerves  Dispense: 90 tablet; Refill: 5 - levothyroxine (SYNTHROID, LEVOTHROID) 75 MCG tablet; Take 1 tablet (75 mcg total) by mouth daily before breakfast.  Dispense: 30 tablet; Refill: 5 - temazepam (RESTORIL) 30 MG capsule; Take 1 capsule (30 mg total) by mouth at bedtime. To help with nerves and with sleep  Dispense:  30 capsule; Refill: 1  2. Hyperlipidemia  - POCT CBC - Lipid panel - CMP14+EGFR - Thyroid Panel With TSH - PSA, total and free - amLODipine (NORVASC) 5 MG tablet; Take 1 tablet (5 mg total) by mouth daily. For  blood presssure  Dispense: 30 tablet; Refill: 5 - busPIRone (BUSPAR) 5 MG tablet; Take 1 tablet (5 mg total) by mouth 3 (three) times daily. For nerves  Dispense: 90 tablet; Refill: 5 - levothyroxine (SYNTHROID, LEVOTHROID) 75 MCG tablet; Take 1 tablet (75 mcg total) by mouth daily before breakfast.  Dispense: 30 tablet; Refill: 5 - temazepam (RESTORIL) 30 MG capsule; Take 1 capsule (30 mg total) by mouth at bedtime. To help with nerves and with sleep  Dispense: 30 capsule; Refill: 1  3. Essential hypertension  - POCT CBC - Lipid panel - CMP14+EGFR - Thyroid Panel With TSH - PSA, total and free - amLODipine (NORVASC) 5 MG tablet; Take 1 tablet (5 mg total) by mouth daily. For  blood presssure  Dispense: 30 tablet; Refill: 5 - busPIRone (BUSPAR) 5 MG tablet; Take 1 tablet (5 mg total) by mouth 3 (three) times daily. For nerves  Dispense: 90 tablet; Refill: 5 - levothyroxine (SYNTHROID, LEVOTHROID) 75 MCG tablet; Take 1 tablet (75 mcg total) by mouth daily before breakfast.  Dispense: 30 tablet; Refill: 5 - temazepam (RESTORIL) 30 MG capsule; Take 1 capsule (30 mg total) by mouth at bedtime. To help with nerves and with sleep  Dispense: 30 capsule; Refill: 1  4. Chronic renal disease,  stage III  - POCT CBC - Lipid panel - CMP14+EGFR - Thyroid Panel With TSH - PSA, total and free - amLODipine (NORVASC) 5 MG tablet; Take 1 tablet (5 mg total) by mouth daily. For  blood presssure  Dispense: 30 tablet; Refill: 5 - busPIRone (BUSPAR) 5 MG tablet; Take 1 tablet (5 mg total) by mouth 3 (three) times daily. For nerves  Dispense: 90 tablet; Refill: 5 - levothyroxine (SYNTHROID, LEVOTHROID) 75 MCG tablet; Take 1 tablet (75 mcg total) by mouth daily before breakfast.  Dispense: 30  tablet; Refill: 5 - temazepam (RESTORIL) 30 MG capsule; Take 1 capsule (30 mg total) by mouth at bedtime. To help with nerves and with sleep  Dispense: 30 capsule; Refill: 1  5. Screening PSA (prostate specific antigen)  - POCT CBC - Lipid panel - CMP14+EGFR - Thyroid Panel With TSH - PSA, total and free - amLODipine (NORVASC) 5 MG tablet; Take 1 tablet (5 mg total) by mouth daily. For  blood presssure  Dispense: 30 tablet; Refill: 5 - busPIRone (BUSPAR) 5 MG tablet; Take 1 tablet (5 mg total) by mouth 3 (three) times daily. For nerves  Dispense: 90 tablet; Refill: 5 - levothyroxine (SYNTHROID, LEVOTHROID) 75 MCG tablet; Take 1 tablet (75 mcg total) by mouth daily before breakfast.  Dispense: 30 tablet; Refill: 5 - temazepam (RESTORIL) 30 MG capsule; Take 1 capsule (30 mg total) by mouth at bedtime. To help with nerves and with sleep  Dispense: 30 capsule; Refill: 1  6. Hypertension, accelerated  - POCT CBC - Lipid panel - CMP14+EGFR - Thyroid Panel With TSH - PSA, total and free - amLODipine (NORVASC) 5 MG tablet; Take 1 tablet (5 mg total) by mouth daily. For  blood presssure  Dispense: 30 tablet; Refill: 5 - busPIRone (BUSPAR) 5 MG tablet; Take 1 tablet (5 mg total) by mouth 3 (three) times daily. For nerves  Dispense: 90 tablet; Refill: 5 - levothyroxine (SYNTHROID, LEVOTHROID) 75 MCG tablet; Take 1 tablet (75 mcg total) by mouth daily before breakfast.  Dispense: 30 tablet; Refill: 5 - temazepam (RESTORIL) 30 MG capsule; Take 1 capsule (30 mg total) by mouth at bedtime. To help with nerves and with sleep  Dispense: 30 capsule; Refill: 1  7. Generalized anxiety disorder  - POCT CBC - Lipid panel - CMP14+EGFR - Thyroid Panel With TSH - PSA, total and free - amLODipine (NORVASC) 5 MG tablet; Take 1 tablet (5 mg total) by mouth daily. For  blood presssure  Dispense: 30 tablet; Refill: 5 - busPIRone (BUSPAR) 5 MG tablet; Take 1 tablet (5 mg total) by mouth 3 (three) times daily.  For nerves  Dispense: 90 tablet; Refill: 5 - levothyroxine (SYNTHROID, LEVOTHROID) 75 MCG tablet; Take 1 tablet (75 mcg total) by mouth daily before breakfast.  Dispense: 30 tablet; Refill: 5 - temazepam (RESTORIL) 30 MG capsule; Take 1 capsule (30 mg total) by mouth at bedtime. To help with nerves and with sleep  Dispense: 30 capsule; Refill: 1  8. Insomnia, idiopathic  - POCT CBC - Lipid panel - CMP14+EGFR - Thyroid Panel With TSH - PSA, total and free - amLODipine (NORVASC) 5 MG tablet; Take 1 tablet (5 mg total) by mouth daily. For  blood presssure  Dispense: 30 tablet; Refill: 5 - busPIRone (BUSPAR) 5 MG tablet; Take 1 tablet (5 mg total) by mouth 3 (three) times daily. For nerves  Dispense: 90 tablet; Refill: 5 - levothyroxine (SYNTHROID, LEVOTHROID) 75 MCG tablet; Take 1 tablet (75 mcg total) by  mouth daily before breakfast.  Dispense: 30 tablet; Refill: 5 - temazepam (RESTORIL) 30 MG capsule; Take 1 capsule (30 mg total) by mouth at bedtime. To help with nerves and with sleep  Dispense: 30 capsule; Refill: 1  Discussed colonoscopy screening with patient but he refuses.    Continue all meds Labs pending Health Maintenance reviewed Diet and exercise encouraged RTO 6 months  Equan Cogbill A. Benjamin Stain PA-C

## 2014-07-14 ENCOUNTER — Telehealth: Payer: Self-pay | Admitting: Family Medicine

## 2014-07-14 NOTE — Telephone Encounter (Signed)
Please review

## 2014-07-14 NOTE — Telephone Encounter (Signed)
Pt notified to call pharmacy Verbalizes understanding

## 2014-07-14 NOTE — Telephone Encounter (Signed)
These were filled on 06/18/14 so he should not need refills. He also needs to come back in for his labs that were supposed to be completed. Madelline Eshbach A. Benjamin Stain PA-C

## 2014-10-01 ENCOUNTER — Ambulatory Visit (INDEPENDENT_AMBULATORY_CARE_PROVIDER_SITE_OTHER): Payer: Commercial Managed Care - HMO | Admitting: Family Medicine

## 2014-10-01 ENCOUNTER — Encounter: Payer: Self-pay | Admitting: Family Medicine

## 2014-10-01 VITALS — BP 162/74 | HR 60 | Temp 96.9°F | Ht 66.0 in | Wt 147.0 lb

## 2014-10-01 DIAGNOSIS — E785 Hyperlipidemia, unspecified: Secondary | ICD-10-CM

## 2014-10-01 DIAGNOSIS — F5101 Primary insomnia: Secondary | ICD-10-CM | POA: Diagnosis not present

## 2014-10-01 DIAGNOSIS — F411 Generalized anxiety disorder: Secondary | ICD-10-CM

## 2014-10-01 DIAGNOSIS — Z125 Encounter for screening for malignant neoplasm of prostate: Secondary | ICD-10-CM | POA: Diagnosis not present

## 2014-10-01 DIAGNOSIS — Z720 Tobacco use: Secondary | ICD-10-CM

## 2014-10-01 DIAGNOSIS — I10 Essential (primary) hypertension: Secondary | ICD-10-CM | POA: Diagnosis not present

## 2014-10-01 DIAGNOSIS — D649 Anemia, unspecified: Secondary | ICD-10-CM

## 2014-10-01 DIAGNOSIS — M1990 Unspecified osteoarthritis, unspecified site: Secondary | ICD-10-CM

## 2014-10-01 DIAGNOSIS — E039 Hypothyroidism, unspecified: Secondary | ICD-10-CM | POA: Diagnosis not present

## 2014-10-01 MED ORDER — TEMAZEPAM 30 MG PO CAPS: 30.0000 mg | ORAL_CAPSULE | Freq: Every day | ORAL | Status: DC

## 2014-10-01 NOTE — Progress Notes (Signed)
Subjective:  Patient ID: Kevin Ho, male    DOB: [birth year]  Age: 81 y.o. MRN: [id number]  CC: Hypertension; Hypothyroidism; and GAD   HPI Joran Kallal Biondolillo presents for  follow-up of hypertension. Patient has no history of headache chest pain or shortness of breath or recent cough. Patient also denies symptoms of TIA such as numbness weakness lateralizing. Patient checks  blood pressure at home and has not had any elevated readings recently. Patient denies side effects from his medication. States taking it regularly. Patient presents for follow-up on  thyroid. He has a history of hypothyroidism for many years. It has been stable recently. Pt. denies any change in  voice, loss of hair, heat or cold intolerance. Energy level has been adequate to good. She denies constipation and diarrhea. No myxedema. Medication is as noted below. Verified that pt is taking it daily on an empty stomach. Well tolerated.  Patient also in for follow-up of his anxiety disorder. He states that he gets anxious and started smoking again every time he quits. He took one dose of Chantix one time and it meets him feel bad so he discontinued it and isn't willing to try again. His medication helps with his anxiety. He also has to have it to help him sleep at night. He sleeps fine as long as he takes his temazepam but sleeps virtually not at all if he doesn't have it.  Currently he is in for evaluation of his cholesterol but is not taking medication for that. He is also not following a close diet. He is not interested in taking more medication. He is also not interested in following a regimented diet.   History Romelo has a past medical history of Hyperlipidemia; Hypertension; Chronic pain; Insomnia; Varicose veins; Cancer; Anxiety; Arthritis; Alcoholism; and Glaucoma.   He has past surgical history that includes right inguinal hernia and porta cath.   His family history includes Diabetes in his son. There is no  history of Colon cancer.He reports that he has been smoking Cigarettes.  He has been smoking about 1.00 pack per day. He does not have any smokeless tobacco history on file. He reports that he does not drink alcohol or use illicit drugs.  Current Outpatient Prescriptions on File Prior to Visit  Medication Sig Dispense Refill  . amLODipine (NORVASC) 5 MG tablet Take 1 tablet (5 mg total) by mouth daily. For  blood presssure 30 tablet 5  . busPIRone (BUSPAR) 5 MG tablet Take 1 tablet (5 mg total) by mouth 3 (three) times daily. For nerves 90 tablet 5  . levothyroxine (SYNTHROID, LEVOTHROID) 75 MCG tablet Take 1 tablet (75 mcg total) by mouth daily before breakfast. 30 tablet 5   No current facility-administered medications on file prior to visit.    ROS Review of Systems  Objective:  BP 162/74 mmHg  Pulse 60  Temp(Src) 96.9 F (36.1 C) (Oral)  Ht _0  (1.676 m)  Wt 147 lb (66.679 kg)  BMI 23.74 kg/m2  BP Readings from Last 3 Encounters:  10/01/14 162/74  06/18/14 147/78  03/15/14 189/90    Wt Readings from Last 3 Encounters:  10/01/14 147 lb (66.679 kg)  06/18/14 145 lb (65.772 kg)  03/15/14 145 lb 3.2 oz (65.862 kg)     Physical Exam  Lab Results  Component Value Date   HGBA1C 5.2 07/07/2013    Lab Results  Component Value Date   WBC 10.0 03/15/2014   HGB 14.6 03/15/2014   HCT  45.2 03/15/2014   PLT 227 06/26/2013   GLUCOSE 119* 03/22/2014   CHOL 218* 03/15/2014   TRIG 272* 03/15/2014   HDL 60 03/15/2014   LDLCALC 104* 03/15/2014   ALT 13 03/22/2014   AST 20 03/22/2014   NA 142 03/22/2014   K 5.6* 03/22/2014   CL 103 03/22/2014   CREATININE 1.21 03/22/2014   BUN 15 03/22/2014   CO2 28 03/22/2014   TSH 2.530 03/15/2014   HGBA1C 5.2 07/07/2013    US Renal  09/01/2013   CLINICAL DATA:  Chronic kidney disease.  EXAM: RENAL/URINARY TRACT ULTRASOUND COMPLETE  COMPARISON:  None.  FINDINGS: Right Kidney:  Length: 10.5 cm. 9 mm cyst seen in upper pole.  Increased echogenicity of renal parenchyma is noted suggesting medical renal disease. No mass or hydronephrosis visualized.  Left Kidney:  Length: 10.5 cm. 7 mm cyst is seen in upper pole. 9 mm cyst is seen in midpole. Increased echogenicity of renal per is noted suggesting medical renal disease. No mass or hydronephrosis visualized.  Bladder:  Appears normal for degree of bladder distention. Mild prostatic enlargement is noted.  IMPRESSION: Increased echogenicity of renal parenchyma is noted bilaterally suggesting medical renal disease. Small bilateral renal cysts are noted. No hydronephrosis or renal obstruction is noted. Mild prostatic enlargement.   Electronically Signed   By: Sabino Dick M.D.   On: 09/01/2013 12:58    Assessment & Plan:   Haddon was seen today for hypertension, hypothyroidism and gad.  Diagnoses and all orders for this visit:  Hypothyroidism, unspecified hypothyroidism type -     CMP14+EGFR -     TSH -     T4, free -     temazepam (RESTORIL) 30 MG capsule; Take 1 capsule (30 mg total) by mouth at bedtime. To help with nerves and with sleep  Essential hypertension -     CMP14+EGFR -     temazepam (RESTORIL) 30 MG capsule; Take 1 capsule (30 mg total) by mouth at bedtime. To help with nerves and with sleep  Arthritis -     CMP14+EGFR  Hyperlipidemia -     CMP14+EGFR -     Lipid panel -     temazepam (RESTORIL) 30 MG capsule; Take 1 capsule (30 mg total) by mouth at bedtime. To help with nerves and with sleep  Normocytic anemia -     CBC with Differential/Platelet  Tobacco user  Insomnia, idiopathic -     temazepam (RESTORIL) 30 MG capsule; Take 1 capsule (30 mg total) by mouth at bedtime. To help with nerves and with sleep  Screening PSA (prostate specific antigen) -     temazepam (RESTORIL) 30 MG capsule; Take 1 capsule (30 mg total) by mouth at bedtime. To help with nerves and with sleep  Generalized anxiety disorder -     temazepam (RESTORIL) 30 MG  capsule; Take 1 capsule (30 mg total) by mouth at bedtime. To help with nerves and with sleep   I am having Mr. Opiela maintain his amLODipine, busPIRone, levothyroxine, and temazepam.  Meds ordered this encounter  Medications  . temazepam (RESTORIL) 30 MG capsule    Sig: Take 1 capsule (30 mg total) by mouth at bedtime. To help with nerves and with sleep    Dispense:  30 capsule    Refill:  5     Follow-up: Return in about 6 months (around 03/31/2015).  [name], M.D.

## 2014-10-02 LAB — CMP14+EGFR
ALT: 6 IU/L (ref 0–44)
AST: 9 IU/L (ref 0–40)
Albumin/Globulin Ratio: 1.5 (ref 1.1–2.5)
Albumin: 4.6 g/dL (ref 3.5–4.7)
Alkaline Phosphatase: 72 IU/L (ref 39–117)
BUN/Creatinine Ratio: 10 (ref 10–22)
BUN: 13 mg/dL (ref 8–27)
Bilirubin Total: 0.4 mg/dL (ref 0.0–1.2)
CALCIUM: 10.5 mg/dL — AB (ref 8.6–10.2)
CO2: 25 mmol/L (ref 18–29)
Chloride: 96 mmol/L — ABNORMAL LOW (ref 97–108)
Creatinine, Ser: 1.28 mg/dL — ABNORMAL HIGH (ref 0.76–1.27)
GFR, EST AFRICAN AMERICAN: 60 mL/min/{1.73_m2} (ref >59)
GFR, EST NON AFRICAN AMERICAN: 52 mL/min/{1.73_m2} — AB (ref >59)
Globulin, Total: 3 g/dL (ref 1.5–4.5)
Glucose: 107 mg/dL — ABNORMAL HIGH (ref 65–99)
Potassium: 4.3 mmol/L (ref 3.5–5.2)
Sodium: 138 mmol/L (ref 134–144)
TOTAL PROTEIN: 7.6 g/dL (ref 6.0–8.5)

## 2014-10-02 LAB — CBC WITH DIFFERENTIAL/PLATELET
Basophils Absolute: 0.1 10*3/uL (ref 0.0–0.2)
Basos: 1 %
EOS (ABSOLUTE): 0.6 10*3/uL — AB (ref 0.0–0.4)
EOS: 8 %
Hematocrit: 43.1 % (ref 37.5–51.0)
Hemoglobin: 14.7 g/dL (ref 12.6–17.7)
IMMATURE GRANS (ABS): 0 10*3/uL (ref 0.0–0.1)
Immature Granulocytes: 0 %
Lymphocytes Absolute: 1.2 10*3/uL (ref 0.7–3.1)
Lymphs: 17 %
MCH: 31.5 pg (ref 26.6–33.0)
MCHC: 34.1 g/dL (ref 31.5–35.7)
MCV: 93 fL (ref 79–97)
MONOS ABS: 0.9 10*3/uL (ref 0.1–0.9)
Monocytes: 13 %
Neutrophils Absolute: 4.4 10*3/uL (ref 1.4–7.0)
Neutrophils: 61 %
PLATELETS: 183 10*3/uL (ref 150–379)
RBC: 4.66 x10E6/uL (ref 4.14–5.80)
RDW: 14 % (ref 12.3–15.4)
WBC: 7.3 10*3/uL (ref 3.4–10.8)

## 2014-10-02 LAB — LIPID PANEL
Chol/HDL Ratio: 4.9 ratio units (ref 0.0–5.0)
Cholesterol, Total: 221 mg/dL — ABNORMAL HIGH (ref 100–199)
HDL: 45 mg/dL (ref >39)
LDL Calculated: 102 mg/dL — ABNORMAL HIGH (ref 0–99)
Triglycerides: 370 mg/dL — ABNORMAL HIGH (ref 0–149)
VLDL Cholesterol Cal: 74 mg/dL — ABNORMAL HIGH (ref 5–40)

## 2014-10-02 LAB — T4, FREE: Free T4: 1.3 ng/dL (ref 0.82–1.77)

## 2014-10-02 LAB — TSH: TSH: 2.81 u[IU]/mL (ref 0.450–4.500)

## 2014-10-07 LAB — SPECIMEN STATUS REPORT

## 2014-10-07 LAB — HGB A1C W/O EAG: HEMOGLOBIN A1C: 5.8 % — AB (ref 4.8–5.6)

## 2014-11-16 ENCOUNTER — Telehealth: Payer: Self-pay | Admitting: Family Medicine

## 2014-11-16 ENCOUNTER — Ambulatory Visit (INDEPENDENT_AMBULATORY_CARE_PROVIDER_SITE_OTHER): Payer: Commercial Managed Care - HMO | Admitting: *Deleted

## 2014-11-16 VITALS — BP 138/60

## 2014-11-16 DIAGNOSIS — I10 Essential (primary) hypertension: Secondary | ICD-10-CM

## 2014-11-16 NOTE — Progress Notes (Signed)
Pt here for BP check

## 2014-12-07 ENCOUNTER — Telehealth: Payer: Self-pay | Admitting: Family Medicine

## 2014-12-07 NOTE — Telephone Encounter (Signed)
Question answered. 

## 2014-12-08 ENCOUNTER — Ambulatory Visit (INDEPENDENT_AMBULATORY_CARE_PROVIDER_SITE_OTHER): Payer: Commercial Managed Care - HMO | Admitting: Family Medicine

## 2014-12-08 ENCOUNTER — Encounter: Payer: Self-pay | Admitting: Family Medicine

## 2014-12-08 VITALS — BP 130/71 | HR 72 | Temp 96.6°F | Ht 66.0 in | Wt 150.2 lb

## 2014-12-08 DIAGNOSIS — F411 Generalized anxiety disorder: Secondary | ICD-10-CM | POA: Diagnosis not present

## 2014-12-08 DIAGNOSIS — M21619 Bunion of unspecified foot: Secondary | ICD-10-CM | POA: Diagnosis not present

## 2014-12-08 DIAGNOSIS — R1310 Dysphagia, unspecified: Secondary | ICD-10-CM

## 2014-12-08 DIAGNOSIS — E039 Hypothyroidism, unspecified: Secondary | ICD-10-CM

## 2014-12-08 DIAGNOSIS — E785 Hyperlipidemia, unspecified: Secondary | ICD-10-CM | POA: Diagnosis not present

## 2014-12-08 DIAGNOSIS — I10 Essential (primary) hypertension: Secondary | ICD-10-CM

## 2014-12-08 DIAGNOSIS — L84 Corns and callosities: Secondary | ICD-10-CM | POA: Diagnosis not present

## 2014-12-08 DIAGNOSIS — F5101 Primary insomnia: Secondary | ICD-10-CM | POA: Diagnosis not present

## 2014-12-08 DIAGNOSIS — C4492 Squamous cell carcinoma of skin, unspecified: Secondary | ICD-10-CM | POA: Diagnosis not present

## 2014-12-08 MED ORDER — BUSPIRONE HCL 5 MG PO TABS: ORAL_TABLET | ORAL | Status: DC

## 2014-12-08 NOTE — Progress Notes (Signed)
Subjective:  Patient ID: Kevin Ho, male    DOB: 1931-10-03  Age: 81 y.o. MRN: [id number]  CC: Hypertension; Hypothyroidism; and GAD   HPI Shaquan Missey Loyola presents for  follow-up of hypertension. Patient has no history of headache chest pain or shortness of breath or recent cough. Patient also denies symptoms of TIA such as numbness weakness lateralizing. Patient checks  blood pressure at home and has not had any elevated readings recently. Patient denies side effects from his medication. States taking it regularly.  Patient presents for follow-up on  thyroid. She has a history of hypothyroidism for many years. It has been stable recently. Pt. denies any change in  voice, loss of hair, heat or cold intolerance. Energy level has been adequate to good. She denies constipation and diarrhea. No myxedema. Medication is as noted below. Verified that pt is taking it daily on an empty stomach. Well tolerated.  Patient in for follow-up of elevated cholesterol. He is not following a low-fat diet nor is he taking medication.  He has chronic issues with anxiety. GAD7 score noted below.  GAD 7 : Generalized Anxiety Score 12/08/2014  Nervous, Anxious, on Edge 3  Control/stop worrying 3  Worry too much - different things 2  Trouble relaxing 3  Restless 3  Easily annoyed or irritable 3  Afraid - awful might happen 1  Total GAD 7 Score 18  Anxiety Difficulty Somewhat difficult   History Cledis has a past medical history of Hyperlipidemia; Hypertension; Chronic pain; Insomnia; Varicose veins; Cancer ([place]); Anxiety; Arthritis; Alcoholism ([place]); and Glaucoma.   He has past surgical history that includes right inguinal hernia and porta cath.   His family history includes Diabetes in his son. There is no history of Colon cancer.He reports that he has been smoking Cigarettes.  He has been smoking about 1.00 pack per day. He does not have any smokeless tobacco history on file. He reports that he  does not drink alcohol or use illicit drugs.  Current Outpatient Prescriptions on File Prior to Visit  Medication Sig Dispense Refill  . amLODipine (NORVASC) 5 MG tablet Take 1 tablet (5 mg total) by mouth daily. For  blood presssure 30 tablet 5  . levothyroxine (SYNTHROID, LEVOTHROID) 75 MCG tablet Take 1 tablet (75 mcg total) by mouth daily before breakfast. 30 tablet 5   No current facility-administered medications on file prior to visit.    ROS Review of Systems  Constitutional: Negative for fever, chills, diaphoresis and unexpected weight change.  HENT: Positive for trouble swallowing (food seems to get caught. Some times will have to regurgitate if it won't go down.). Negative for congestion, hearing loss, rhinorrhea and sore throat.   Eyes: Negative for visual disturbance.  Respiratory: Negative for cough and shortness of breath.   Cardiovascular: Negative for chest pain.  Gastrointestinal: Negative for abdominal pain, diarrhea, constipation and blood in stool.  Endocrine: Negative for cold intolerance and heat intolerance.  Genitourinary: Negative for dysuria and flank pain.  Musculoskeletal: Positive for myalgias (Has pain at the base of right big toe) and arthralgias. Negative for joint swelling.  Skin: Negative for rash.       3 lesions on scalp that are scaly and itch that he would like to have checked  Neurological: Negative for dizziness and headaches.  Psychiatric/Behavioral: Positive for behavioral problems, confusion, sleep disturbance, dysphoric mood and agitation. Negative for suicidal ideas and self-injury. The patient is nervous/anxious.     Objective:  BP 130/71 mmHg  Pulse 72  Temp(Src) 96.6 F (35.9 C) (Oral)  Ht '5\' 6"'$  (1.676 m)  Wt 150 lb 3.2 oz (68.13 kg)  BMI 24.25 kg/m2  SpO2 99%  BP Readings from Last 3 Encounters:  12/08/14 130/71  11/16/14 138/60  10/01/14 162/74    Wt Readings from Last 3 Encounters:  12/08/14 150 lb 3.2 oz (68.13 kg)    10/01/14 147 lb (66.679 kg)  06/18/14 145 lb (65.772 kg)     Physical Exam  Constitutional: He is oriented to person, place, and time. He appears well-developed. No distress.  HENT:  Head: Normocephalic and atraumatic.  Mouth/Throat: Oropharynx is clear and moist.  Eyes: Conjunctivae are normal. Pupils are equal, round, and reactive to light.  Neck: Normal range of motion. Neck supple. No thyromegaly present.  Cardiovascular: Normal rate, regular rhythm and normal heart sounds.   No murmur heard. Pulmonary/Chest: Effort normal and breath sounds normal. No respiratory distress. He has no wheezes. He has no rales.  Abdominal: Soft. Bowel sounds are normal. He exhibits no distension. There is no tenderness.  Musculoskeletal: He exhibits tenderness (At base of right large toe. Inspection reveals mild bunion deformity).  Lymphadenopathy:    He has no cervical adenopathy.  Neurological: He is alert and oriented to person, place, and time. He has normal reflexes.  Skin: Skin is warm and dry.  There are 3 areas of discoloration and thin scale resembling squamous carcinoma measuring approximately 6 x 12 mm at the crown of the bald scalp. Another measuring 4 x 8 mm at the frontal scalp and another 4 x 8 at the left temple.  Psychiatric: His mood appears anxious. His speech is rapid and/or pressured. He is agitated. Thought content is paranoid. Cognition and memory are impaired. He expresses impulsivity and inappropriate judgment.    Lab Results  Component Value Date   HGBA1C 5.8* 10/01/2014   HGBA1C 5.2 07/07/2013    Lab Results  Component Value Date   WBC 7.3 10/01/2014   HGB 14.6 03/15/2014   HCT 43.1 10/01/2014   PLT 227 06/26/2013   GLUCOSE 107* 10/01/2014   CHOL 221* 10/01/2014   TRIG 370* 10/01/2014   HDL 45 10/01/2014   LDLCALC 102* 10/01/2014   ALT 6 10/01/2014   AST 9 10/01/2014   NA 138 10/01/2014   K 4.3 10/01/2014   CL 96* 10/01/2014   CREATININE 1.28* 10/01/2014    BUN 13 10/01/2014   CO2 25 10/01/2014   TSH 2.810 10/01/2014   HGBA1C 5.8* 10/01/2014    US Renal  09/01/2013  CLINICAL DATA:  Chronic kidney disease. EXAM: RENAL/URINARY TRACT ULTRASOUND COMPLETE COMPARISON:  None. FINDINGS: Right Kidney: Length: 10.5 cm. 9 mm cyst seen in upper pole. Increased echogenicity of renal parenchyma is noted suggesting medical renal disease. No mass or hydronephrosis visualized. Left Kidney: Length: 10.5 cm. 7 mm cyst is seen in upper pole. 9 mm cyst is seen in midpole. Increased echogenicity of renal per is noted suggesting medical renal disease. No mass or hydronephrosis visualized. Bladder: Appears normal for degree of bladder distention. Mild prostatic enlargement is noted. IMPRESSION: Increased echogenicity of renal parenchyma is noted bilaterally suggesting medical renal disease. Small bilateral renal cysts are noted. No hydronephrosis or renal obstruction is noted. Mild prostatic enlargement. Electronically Signed   By: Sabino Dick M.D.   On: 09/01/2013 12:58    Assessment & Plan:   Clemens was seen today for hypertension, hypothyroidism and gad.  Diagnoses and all orders for this visit:  Bunion -     Ambulatory referral to Podiatry  Callus of foot -     Ambulatory referral to Podiatry  Dysphagia -     Ambulatory referral to Gastroenterology  Squamous cell carcinoma of skin -     Ambulatory referral to Dermatology  Generalized anxiety disorder -     busPIRone (BUSPAR) 5 MG tablet; Increase by one pill every third day until taking 3 twice daily.  Hypothyroidism, unspecified hypothyroidism type  Hyperlipidemia  Essential hypertension  Screening PSA (prostate specific antigen)  Insomnia, idiopathic   I have changed Mr. Mchugh's busPIRone. I am also having him maintain his amLODipine and levothyroxine. also the temazepam.  Meds ordered this encounter  Medications  . busPIRone (BUSPAR) 5 MG tablet    Sig: Increase by one pill every third  day until taking 3 twice daily.    Dispense:  180 tablet    Refill:  2   I discussed with the patient the dangers of mixing benzodiazepines. Particularly since he has been using the temazepam long-term I don't think it would be wise to change that at this point. However if his insomnia worsens we can consider adding trazodone etc. Unfortunately that means that his request for Xanax/clonazepam or similar agent for his anxiety is not in his own best interest.  Buspirone is one of the better medicines on the market to treat anxiety. However he have to increase the dose slowly for it to be effective.  Please increase the 5 mg dose to 1 tablet in the morning and 2 in the evening for 3 days.   Then increase to 2 in the morning and 2 in the evening for 3 more days.  Then increase to 2 in the morning and 3 in the evening for 3 days.  Then increase to 3 in the morning and 3 in the evening. Stay at that dose until further notice.  If there is excessive nausea headache or drowsiness at any level, drop back to the previous level for 3 more days before trying to go up and resume the schedule.  Continue blood pressure medication as blood pressure is good today. Monitor it at home and limit salt intake. For cholesterol follow low fat diet. Follow-up: Return in about 2 months (around 02/07/2015).  [name], M.D.

## 2014-12-08 NOTE — Patient Instructions (Signed)
Buspirone is one of the better medicines on the market to treat anxiety. However he have to increase the dose slowly for it to be effective.  Please increase the 5 mg dose to 1 tablet in the morning and 2 in the evening for 3 days.   Then increase to 2 in the morning and 2 in the evening for 3 more days.  Then increase to 2 in the morning and 3 in the evening for 3 days.  Then increase to 3 in the morning and 3 in the evening. Stay at that dose until further notice.  If there is excessive nausea headache or drowsiness at any level, drop back to the previous level for 3 more days before trying to go up and resume the schedule.

## 2014-12-13 ENCOUNTER — Other Ambulatory Visit: Payer: Self-pay | Admitting: Physician Assistant

## 2014-12-13 DIAGNOSIS — F411 Generalized anxiety disorder: Secondary | ICD-10-CM | POA: Insufficient documentation

## 2014-12-13 DIAGNOSIS — F5101 Primary insomnia: Secondary | ICD-10-CM | POA: Insufficient documentation

## 2014-12-13 NOTE — Telephone Encounter (Signed)
Last seen 12/08/14  Dr Livia Snellen

## 2014-12-16 ENCOUNTER — Other Ambulatory Visit: Payer: Self-pay | Admitting: Family Medicine

## 2014-12-16 NOTE — Telephone Encounter (Signed)
Script was set to print at visit on 12/13/14. Spoke with daughter-in-law because Kevin Ho didn't answer. Also his memory is very poor these days.  They were not handed a prescription at the visit.  Called into [facility] in [place].

## 2014-12-29 ENCOUNTER — Other Ambulatory Visit: Payer: Self-pay | Admitting: Physician Assistant

## 2015-01-13 ENCOUNTER — Encounter: Payer: Self-pay | Admitting: Podiatry

## 2015-01-13 ENCOUNTER — Ambulatory Visit (INDEPENDENT_AMBULATORY_CARE_PROVIDER_SITE_OTHER): Payer: Commercial Managed Care - HMO | Admitting: Podiatry

## 2015-01-13 VITALS — BP 158/69 | HR 86 | Resp 12

## 2015-01-13 DIAGNOSIS — M21619 Bunion of unspecified foot: Secondary | ICD-10-CM | POA: Diagnosis not present

## 2015-01-13 DIAGNOSIS — L84 Corns and callosities: Secondary | ICD-10-CM | POA: Diagnosis not present

## 2015-01-13 NOTE — Progress Notes (Signed)
   Subjective:    Patient ID: Kevin Ho, male    DOB: Apr 24, 1931, 81 y.o.   MRN: [id number]  HPI PT STATED HAVE CALLUS ON BOTH FEET AND BEEN HURTING FOR 1 YEAR. FEET ARE GETTING WORSE ESPECALLY WHEN WALKING. TRIED NO TREATMENT.   Review of Systems  HENT: Positive for sore throat.   Neurological: Positive for dizziness.  Psychiatric/Behavioral: The patient is nervous/anxious.   All other systems reviewed and are negative.      Objective:   Physical Exam        Assessment & Plan:

## 2015-01-13 NOTE — Progress Notes (Signed)
Subjective:     Patient ID: Kevin Ho, male   DOB: [birth year], 81 y.o.   MRN: [id number]  HPI patient presents with painful lesions underneath his right first and fifth metatarsals that makes walking difficult and states she's tried to trim them and tried padding without relief   Review of Systems  All other systems reviewed and are negative.      Objective:   Physical Exam  Constitutional: He is oriented to person, place, and time.  Cardiovascular: Intact distal pulses.   Musculoskeletal: Normal range of motion.  Neurological: He is oriented to person, place, and time.  Skin: Skin is warm and dry.  Nursing note and vitals reviewed.  neurovascular status found to be intact with muscle strength diminished and range of motion diminished subtalar midtarsal joint. Patient's noted to have diminished fat pad with keratotic lesion sub-5 sub-1 right that are painful when pressed around the metatarsal heads 1 and 5     Assessment:      poor health individual with plantarflexed metatarsal lesion formation and diminished fat pad    Plan:      H&P all conditions removed and debridement discussed an accomplished with no iatrogenic bleeding noted. Patient will be seen back as needed

## 2015-01-28 ENCOUNTER — Telehealth: Payer: Self-pay | Admitting: Family Medicine

## 2015-03-13 ENCOUNTER — Other Ambulatory Visit: Payer: Self-pay | Admitting: Family Medicine

## 2015-04-06 ENCOUNTER — Other Ambulatory Visit: Payer: Self-pay | Admitting: Family Medicine

## 2015-04-11 ENCOUNTER — Encounter: Payer: Self-pay | Admitting: Family Medicine

## 2015-04-11 ENCOUNTER — Ambulatory Visit (INDEPENDENT_AMBULATORY_CARE_PROVIDER_SITE_OTHER): Payer: Commercial Managed Care - HMO | Admitting: Family Medicine

## 2015-04-11 VITALS — BP 138/69 | HR 65 | Temp 97.2°F | Ht 66.0 in | Wt 147.4 lb

## 2015-04-11 DIAGNOSIS — I10 Essential (primary) hypertension: Secondary | ICD-10-CM | POA: Diagnosis not present

## 2015-04-11 DIAGNOSIS — G309 Alzheimer's disease, unspecified: Secondary | ICD-10-CM

## 2015-04-11 DIAGNOSIS — F0281 Dementia in other diseases classified elsewhere with behavioral disturbance: Secondary | ICD-10-CM

## 2015-04-11 DIAGNOSIS — G308 Other Alzheimer's disease: Secondary | ICD-10-CM | POA: Diagnosis not present

## 2015-04-11 MED ORDER — BUSPIRONE HCL 5 MG PO TABS: ORAL_TABLET | ORAL | Status: DC

## 2015-04-11 MED ORDER — DONEPEZIL HCL 5 MG PO TABS: 5.0000 mg | ORAL_TABLET | Freq: Every day | ORAL | Status: DC

## 2015-04-11 MED ORDER — AMLODIPINE BESYLATE 5 MG PO TABS: ORAL_TABLET | ORAL | Status: DC

## 2015-04-11 MED ORDER — LEVOTHYROXINE SODIUM 75 MCG PO TABS: ORAL_TABLET | ORAL | Status: DC

## 2015-04-11 NOTE — Progress Notes (Signed)
Subjective:  Patient ID: Kevin Ho, male    DOB: 07-Aug-1931  Age: 81 y.o. MRN: [id number]  CC: Hypertension; GAD; and Hypothyroidism   HPI Kevin Ho presents for  follow-up of hypertension. Patient has no history of headache chest pain or shortness of breath or recent cough. Patient also denies symptoms of TIA such as numbness weakness lateralizing. Patient checks  blood pressure at home and has not had any elevated readings recently. Patient denies side effects from his medication. States taking it regularly. Patient presents for follow-up on  thyroid. The patient has a history of hypothyroidism for many years. It has been stable recently. Pt. denies any change in  voice, loss of hair, heat or cold intolerance. Energy level has been adequate to good. Patient denies constipation and diarrhea. No myxedema. Medication is as noted below. Verified that pt is taking it daily on an empty stomach. Well tolerated.  GAD 7 : Generalized Anxiety Score 04/11/2015 12/08/2014  Nervous, Anxious, on Edge 3 3  Control/stop worrying 0 3  Worry too much - different things 0 2  Trouble relaxing 0 3  Restless 1 3  Easily annoyed or irritable 2 3  Afraid - awful might happen 2 1  Total GAD 7 Score 8 18  Anxiety Difficulty Not difficult at all Somewhat difficult    Lengthy discussion including MMSE performed regarding dementia. See below. Also patient is living in a trailer on his son's property. Son and daughter-in-law are very concerned that the liver is unsanitary. It is full of roaches. There are mice droppings. It is unkempt. Daughter-in-law is trying to help. However she can't keep up with the level of roaches with her efforts far. Dad just doesn't seem to understand their concerns. He doesn't seem to process information. They're also concerned about his smoking. He doesn't seem to process their concerns about his health. He seems to be forgetful. He tends to wander off in the middle of the night  and hitchhike to stores to get cigarettes. Son and daughter-in-law are with him and express concern for his safety.  History Kevin Ho has a past medical history of Hyperlipidemia; Hypertension; Chronic pain; Insomnia; Varicose veins; Cancer ([place]); Anxiety; Arthritis; Alcoholism ([place]); and Glaucoma.   He has past surgical history that includes right inguinal hernia and porta cath.   His family history includes Diabetes in his son. There is no history of Colon cancer.He reports that he has been smoking Cigarettes.  He has been smoking about 1.00 pack per day. He does not have any smokeless tobacco history on file. He reports that he does not drink alcohol or use illicit drugs.    ROS Review of Systems  Constitutional: Negative for fever, chills, diaphoresis and unexpected weight change.  HENT: Negative for congestion, hearing loss, rhinorrhea and sore throat.   Eyes: Negative for visual disturbance.  Respiratory: Negative for cough and shortness of breath.   Cardiovascular: Negative for chest pain.  Gastrointestinal: Negative for abdominal pain, diarrhea and constipation.  Genitourinary: Negative for dysuria and flank pain.  Musculoskeletal: Negative for joint swelling and arthralgias.  Skin: Negative for rash.  Neurological: Negative for dizziness and headaches.  Psychiatric/Behavioral: Negative for sleep disturbance and dysphoric mood.    Objective:  BP 138/69 mmHg  Pulse 65  Temp(Src) 97.2 F (36.2 C) (Oral)  Ht 5' 6"  (1.676 m)  Wt 147 lb 6.4 oz (66.86 kg)  BMI 23.80 kg/m2  SpO2 96%  BP Readings from Last 3 Encounters:  04/11/15  138/69  01/13/15 158/69  12/08/14 130/71    Wt Readings from Last 3 Encounters:  04/11/15 147 lb 6.4 oz (66.86 kg)  12/08/14 150 lb 3.2 oz (68.13 kg)  10/01/14 147 lb (66.679 kg)     Physical Exam  Constitutional: He is oriented to person, place, and time. He appears well-developed and well-nourished. No distress.  HENT:  Head: Normocephalic  and atraumatic.  Right Ear: External ear normal.  Left Ear: External ear normal.  Nose: Nose normal.  Mouth/Throat: Oropharynx is clear and moist.  Eyes: Conjunctivae and EOM are normal. Pupils are equal, round, and reactive to light.  Neck: Normal range of motion. Neck supple. No thyromegaly present.  Cardiovascular: Normal rate, regular rhythm and normal heart sounds.   No murmur heard. Pulmonary/Chest: Effort normal and breath sounds normal. No respiratory distress. He has no wheezes. He has no rales.  Abdominal: Soft. Bowel sounds are normal. He exhibits no distension. There is no tenderness.  Lymphadenopathy:    He has no cervical adenopathy.  Neurological: He is alert and oriented to person, place, and time. He has normal reflexes.  Skin: Skin is warm and dry.  Psychiatric: He has a normal mood and affect. His behavior is normal. Judgment and thought content normal.   MMSE - Mini Mental State Exam 04/11/2015 04/11/2015  Orientation to time 2 2  Orientation to Place 5 5  Registration - 3  Attention/ Calculation - 1  Recall - 3  Language- name 2 objects - 2  Language- repeat - 1  Language- follow 3 step command - 2  Language- read & follow direction - 1  Write a sentence - 1  Copy design - 1  Total score - 22     Lab Results  Component Value Date   WBC 7.3 10/01/2014   HGB 14.6 03/15/2014   HCT 43.1 10/01/2014   PLT 183 10/01/2014   GLUCOSE 107* 10/01/2014   CHOL 221* 10/01/2014   TRIG 370* 10/01/2014   HDL 45 10/01/2014   LDLCALC 102* 10/01/2014   ALT 6 10/01/2014   AST 9 10/01/2014   NA 138 10/01/2014   K 4.3 10/01/2014   CL 96* 10/01/2014   CREATININE 1.28* 10/01/2014   BUN 13 10/01/2014   CO2 25 10/01/2014   TSH 2.810 10/01/2014   HGBA1C 5.8* 10/01/2014    US Renal  09/01/2013  CLINICAL DATA:  Chronic kidney disease. EXAM: RENAL/URINARY TRACT ULTRASOUND COMPLETE COMPARISON:  None. FINDINGS: Right Kidney: Length: 10.5 cm. 9 mm cyst seen in upper pole.  Increased echogenicity of renal parenchyma is noted suggesting medical renal disease. No mass or hydronephrosis visualized. Left Kidney: Length: 10.5 cm. 7 mm cyst is seen in upper pole. 9 mm cyst is seen in midpole. Increased echogenicity of renal per is noted suggesting medical renal disease. No mass or hydronephrosis visualized. Bladder: Appears normal for degree of bladder distention. Mild prostatic enlargement is noted. IMPRESSION: Increased echogenicity of renal parenchyma is noted bilaterally suggesting medical renal disease. Small bilateral renal cysts are noted. No hydronephrosis or renal obstruction is noted. Mild prostatic enlargement. Electronically Signed   By: Sabino Dick M.D.   On: 09/01/2013 12:58    Assessment & Plan:   Kevin Ho was seen today for hypertension, gad and hypothyroidism.  Diagnoses and all orders for this visit:  Essential hypertension -     CBC with Differential/Platelet -     CMP14+EGFR -     TSH + free T4 -  RPR -     Sedimentation rate  Alzheimer's dementia with behavioral disturbance, unspecified timing of dementia onset -     donepezil (ARICEPT) 5 MG tablet; Take 1 tablet (5 mg total) by mouth at bedtime.  Other orders -     amLODipine (NORVASC) 5 MG tablet; TAKE ONE TABLET BY MOUTH ONCE DAILY FOR BLOOD PRESSURE -     busPIRone (BUSPAR) 5 MG tablet; THREE TABLETS TWICE DAILY -     levothyroxine (SYNTHROID, LEVOTHROID) 75 MCG tablet; TAKE ONE TABLET BY MOUTH ONCE DAILY BEFORE BREAKFAST   approximately 45 minutes was spent in discussion with the family regarding his condition. This included MMSE and taking history as well as advising on coping skills for Alzheimer's with the son and daughter-in-law.  I have changed Mr. Germano's busPIRone. I am also having him start on donepezil. Additionally, I am having him maintain his temazepam, amLODipine, and levothyroxine.  Meds ordered this encounter  Medications  . donepezil (ARICEPT) 5 MG tablet    Sig:  Take 1 tablet (5 mg total) by mouth at bedtime.    Dispense:  30 tablet    Refill:  1  . amLODipine (NORVASC) 5 MG tablet    Sig: TAKE ONE TABLET BY MOUTH ONCE DAILY FOR BLOOD PRESSURE    Dispense:  30 tablet    Refill:  5  . busPIRone (BUSPAR) 5 MG tablet    Sig: THREE TABLETS TWICE DAILY    Dispense:  180 tablet    Refill:  0  . levothyroxine (SYNTHROID, LEVOTHROID) 75 MCG tablet    Sig: TAKE ONE TABLET BY MOUTH ONCE DAILY BEFORE BREAKFAST    Dispense:  30 tablet    Refill:  8     Follow-up: Return in about 1 month (around 05/12/2015) for Alzheimer's dementia.  [name], M.D.

## 2015-06-07 ENCOUNTER — Other Ambulatory Visit: Payer: Self-pay | Admitting: Family Medicine

## 2015-06-13 ENCOUNTER — Other Ambulatory Visit: Payer: Self-pay | Admitting: Family Medicine

## 2015-06-15 ENCOUNTER — Other Ambulatory Visit: Payer: Self-pay | Admitting: Family Medicine

## 2015-06-16 ENCOUNTER — Other Ambulatory Visit: Payer: Self-pay | Admitting: Family Medicine

## 2015-06-16 ENCOUNTER — Telehealth: Payer: Self-pay | Admitting: *Deleted

## 2015-06-16 NOTE — Telephone Encounter (Signed)
Patient aware restoril script can be picked up here at the office tomorrow.

## 2015-06-17 ENCOUNTER — Other Ambulatory Visit: Payer: Self-pay | Admitting: *Deleted

## 2015-06-17 NOTE — Telephone Encounter (Signed)
RX for Temazepam called into [facility] per Dr Livia Snellen

## 2015-08-15 ENCOUNTER — Other Ambulatory Visit: Payer: Self-pay | Admitting: Family Medicine

## 2015-08-16 ENCOUNTER — Other Ambulatory Visit: Payer: Self-pay | Admitting: Family Medicine

## 2015-08-16 NOTE — Telephone Encounter (Signed)
Refill called to [facility]

## 2015-08-16 NOTE — Telephone Encounter (Signed)
Last filled 07/15/15, last seen 04/11/15

## 2015-08-25 ENCOUNTER — Other Ambulatory Visit: Payer: Self-pay | Admitting: Family Medicine

## 2015-08-29 NOTE — Telephone Encounter (Signed)
Pt's home phone has no VM setup & mobile VM is full

## 2015-08-29 NOTE — Telephone Encounter (Signed)
Authorize 30 days only. Then contact the patient letting them know that they will need an appointment before any further prescriptions can be sent in. 

## 2015-08-30 ENCOUNTER — Telehealth: Payer: Self-pay | Admitting: Family Medicine

## 2015-10-04 ENCOUNTER — Other Ambulatory Visit: Payer: Self-pay | Admitting: Family Medicine

## 2015-10-06 ENCOUNTER — Other Ambulatory Visit: Payer: Self-pay | Admitting: Family Medicine

## 2015-10-09 ENCOUNTER — Other Ambulatory Visit: Payer: Self-pay | Admitting: Family Medicine

## 2015-10-12 ENCOUNTER — Other Ambulatory Visit: Payer: Self-pay | Admitting: Family Medicine

## 2015-10-12 DIAGNOSIS — H02831 Dermatochalasis of right upper eyelid: Secondary | ICD-10-CM | POA: Diagnosis not present

## 2015-10-12 DIAGNOSIS — H268 Other specified cataract: Secondary | ICD-10-CM | POA: Diagnosis not present

## 2015-10-12 DIAGNOSIS — H31091 Other chorioretinal scars, right eye: Secondary | ICD-10-CM | POA: Diagnosis not present

## 2015-10-12 DIAGNOSIS — H25812 Combined forms of age-related cataract, left eye: Secondary | ICD-10-CM | POA: Diagnosis not present

## 2015-10-13 NOTE — Telephone Encounter (Signed)
Last seen 04/11/2015. Last filled 09/12/2015

## 2015-10-17 ENCOUNTER — Telehealth: Payer: Self-pay | Admitting: Family Medicine

## 2015-10-17 ENCOUNTER — Other Ambulatory Visit: Payer: Self-pay | Admitting: *Deleted

## 2015-10-17 ENCOUNTER — Other Ambulatory Visit: Payer: Self-pay | Admitting: Family Medicine

## 2015-10-17 MED ORDER — TEMAZEPAM 30 MG PO CAPS: 30.0000 mg | ORAL_CAPSULE | Freq: Every day | ORAL | 0 refills | Status: DC

## 2015-10-17 NOTE — Telephone Encounter (Signed)
Authorize 30 days only. Then contact the patient letting them know that they will need an appointment before any further prescriptions can be sent in. 

## 2015-10-17 NOTE — Progress Notes (Signed)
RX called into [facility] Pt notified he will need to be seen for further refills

## 2015-10-18 DIAGNOSIS — H268 Other specified cataract: Secondary | ICD-10-CM | POA: Diagnosis not present

## 2015-11-07 ENCOUNTER — Other Ambulatory Visit: Payer: Self-pay | Admitting: Family Medicine

## 2015-11-14 ENCOUNTER — Other Ambulatory Visit: Payer: Self-pay | Admitting: Family Medicine

## 2015-11-15 ENCOUNTER — Other Ambulatory Visit: Payer: Self-pay | Admitting: Family Medicine

## 2015-11-18 ENCOUNTER — Other Ambulatory Visit: Payer: Self-pay | Admitting: Family Medicine

## 2015-11-18 NOTE — Telephone Encounter (Signed)
Last filled 10/17/15, last seen 04/11/15.

## 2015-11-22 ENCOUNTER — Ambulatory Visit: Payer: Commercial Managed Care - HMO | Admitting: Family Medicine

## 2015-11-25 DIAGNOSIS — F419 Anxiety disorder, unspecified: Secondary | ICD-10-CM | POA: Diagnosis not present

## 2015-11-25 DIAGNOSIS — H268 Other specified cataract: Secondary | ICD-10-CM | POA: Diagnosis not present

## 2015-11-25 DIAGNOSIS — I10 Essential (primary) hypertension: Secondary | ICD-10-CM | POA: Diagnosis not present

## 2015-11-28 NOTE — Telephone Encounter (Signed)
Pt has been told multiple times he ntbs for any refills.

## 2015-11-29 ENCOUNTER — Other Ambulatory Visit: Payer: Self-pay | Admitting: Family Medicine

## 2015-11-30 ENCOUNTER — Other Ambulatory Visit: Payer: Self-pay | Admitting: Family Medicine

## 2015-12-09 ENCOUNTER — Other Ambulatory Visit: Payer: Self-pay | Admitting: Family Medicine

## 2015-12-10 ENCOUNTER — Other Ambulatory Visit: Payer: Self-pay | Admitting: Family Medicine

## 2015-12-12 NOTE — Telephone Encounter (Signed)
Authorize 30 days only. Then contact the patient letting them know that they will need an appointment before any further prescriptions can be sent in. 

## 2015-12-13 DIAGNOSIS — F0390 Unspecified dementia without behavioral disturbance: Secondary | ICD-10-CM | POA: Diagnosis not present

## 2015-12-13 DIAGNOSIS — H25811 Combined forms of age-related cataract, right eye: Secondary | ICD-10-CM | POA: Diagnosis not present

## 2015-12-13 DIAGNOSIS — Z961 Presence of intraocular lens: Secondary | ICD-10-CM | POA: Diagnosis not present

## 2015-12-13 DIAGNOSIS — E039 Hypothyroidism, unspecified: Secondary | ICD-10-CM | POA: Diagnosis not present

## 2015-12-13 DIAGNOSIS — Z7982 Long term (current) use of aspirin: Secondary | ICD-10-CM | POA: Diagnosis not present

## 2015-12-13 DIAGNOSIS — H2181 Floppy iris syndrome: Secondary | ICD-10-CM | POA: Diagnosis not present

## 2015-12-13 DIAGNOSIS — M1990 Unspecified osteoarthritis, unspecified site: Secondary | ICD-10-CM | POA: Diagnosis not present

## 2015-12-13 DIAGNOSIS — F419 Anxiety disorder, unspecified: Secondary | ICD-10-CM | POA: Diagnosis not present

## 2015-12-13 DIAGNOSIS — I10 Essential (primary) hypertension: Secondary | ICD-10-CM | POA: Diagnosis not present

## 2015-12-13 DIAGNOSIS — H59211 Accidental puncture and laceration of right eye and adnexa during an ophthalmic procedure: Secondary | ICD-10-CM | POA: Diagnosis not present

## 2015-12-13 DIAGNOSIS — H2589 Other age-related cataract: Secondary | ICD-10-CM | POA: Diagnosis not present

## 2015-12-14 DIAGNOSIS — Z961 Presence of intraocular lens: Secondary | ICD-10-CM | POA: Diagnosis not present

## 2015-12-22 DIAGNOSIS — H527 Unspecified disorder of refraction: Secondary | ICD-10-CM | POA: Diagnosis not present

## 2015-12-22 DIAGNOSIS — Z961 Presence of intraocular lens: Secondary | ICD-10-CM | POA: Diagnosis not present

## 2015-12-22 DIAGNOSIS — F0390 Unspecified dementia without behavioral disturbance: Secondary | ICD-10-CM | POA: Diagnosis not present

## 2015-12-22 DIAGNOSIS — E785 Hyperlipidemia, unspecified: Secondary | ICD-10-CM | POA: Diagnosis not present

## 2015-12-22 DIAGNOSIS — F17200 Nicotine dependence, unspecified, uncomplicated: Secondary | ICD-10-CM | POA: Diagnosis not present

## 2015-12-22 DIAGNOSIS — H25812 Combined forms of age-related cataract, left eye: Secondary | ICD-10-CM | POA: Diagnosis not present

## 2015-12-22 DIAGNOSIS — M1990 Unspecified osteoarthritis, unspecified site: Secondary | ICD-10-CM | POA: Diagnosis not present

## 2015-12-22 DIAGNOSIS — H2181 Floppy iris syndrome: Secondary | ICD-10-CM | POA: Diagnosis not present

## 2015-12-22 DIAGNOSIS — E039 Hypothyroidism, unspecified: Secondary | ICD-10-CM | POA: Diagnosis not present

## 2015-12-22 DIAGNOSIS — I10 Essential (primary) hypertension: Secondary | ICD-10-CM | POA: Diagnosis not present

## 2015-12-23 DIAGNOSIS — H02831 Dermatochalasis of right upper eyelid: Secondary | ICD-10-CM | POA: Diagnosis not present

## 2015-12-23 DIAGNOSIS — H31091 Other chorioretinal scars, right eye: Secondary | ICD-10-CM | POA: Diagnosis not present

## 2015-12-23 DIAGNOSIS — H02834 Dermatochalasis of left upper eyelid: Secondary | ICD-10-CM | POA: Diagnosis not present

## 2015-12-23 DIAGNOSIS — Z961 Presence of intraocular lens: Secondary | ICD-10-CM | POA: Diagnosis not present

## 2016-01-06 ENCOUNTER — Other Ambulatory Visit: Payer: Self-pay | Admitting: Family Medicine

## 2016-01-06 ENCOUNTER — Other Ambulatory Visit: Payer: Self-pay | Admitting: Pediatrics

## 2016-01-13 ENCOUNTER — Other Ambulatory Visit: Payer: Self-pay | Admitting: Family Medicine

## 2016-02-03 ENCOUNTER — Other Ambulatory Visit: Payer: Self-pay | Admitting: Family Medicine

## 2016-02-06 ENCOUNTER — Other Ambulatory Visit: Payer: Self-pay | Admitting: Family Medicine

## 2016-02-08 NOTE — Telephone Encounter (Signed)
Patient last seen in office on 04-11-15. Rx last filled on 10-17-15 for #30. Please advise on refill and route to Pool B so nurse can phone in to pharmacy

## 2016-02-14 ENCOUNTER — Other Ambulatory Visit: Payer: Self-pay | Admitting: Family Medicine

## 2016-02-14 ENCOUNTER — Other Ambulatory Visit: Payer: Self-pay | Admitting: Pediatrics

## 2016-03-19 ENCOUNTER — Other Ambulatory Visit: Payer: Self-pay | Admitting: Family Medicine

## 2016-04-09 ENCOUNTER — Other Ambulatory Visit: Payer: Self-pay | Admitting: Family Medicine

## 2016-05-04 ENCOUNTER — Other Ambulatory Visit: Payer: Self-pay | Admitting: Family Medicine

## 2016-05-04 NOTE — Telephone Encounter (Signed)
Not seen in a year

## 2016-05-25 ENCOUNTER — Ambulatory Visit (INDEPENDENT_AMBULATORY_CARE_PROVIDER_SITE_OTHER): Payer: Medicare HMO

## 2016-05-25 ENCOUNTER — Encounter: Payer: Self-pay | Admitting: Family

## 2016-05-25 ENCOUNTER — Ambulatory Visit (INDEPENDENT_AMBULATORY_CARE_PROVIDER_SITE_OTHER): Payer: Medicare HMO | Admitting: Family

## 2016-05-25 VITALS — BP 152/78 | HR 74 | Temp 97.5°F | Ht 66.0 in | Wt 136.0 lb

## 2016-05-25 DIAGNOSIS — F5101 Primary insomnia: Secondary | ICD-10-CM

## 2016-05-25 DIAGNOSIS — Z23 Encounter for immunization: Secondary | ICD-10-CM

## 2016-05-25 DIAGNOSIS — Z87891 Personal history of nicotine dependence: Secondary | ICD-10-CM

## 2016-05-25 DIAGNOSIS — F411 Generalized anxiety disorder: Secondary | ICD-10-CM

## 2016-05-25 DIAGNOSIS — N183 Chronic kidney disease, stage 3 unspecified: Secondary | ICD-10-CM

## 2016-05-25 DIAGNOSIS — E785 Hyperlipidemia, unspecified: Secondary | ICD-10-CM

## 2016-05-25 DIAGNOSIS — B078 Other viral warts: Secondary | ICD-10-CM

## 2016-05-25 DIAGNOSIS — Z720 Tobacco use: Secondary | ICD-10-CM | POA: Diagnosis not present

## 2016-05-25 DIAGNOSIS — M1990 Unspecified osteoarthritis, unspecified site: Secondary | ICD-10-CM | POA: Diagnosis not present

## 2016-05-25 DIAGNOSIS — Z0000 Encounter for general adult medical examination without abnormal findings: Secondary | ICD-10-CM

## 2016-05-25 DIAGNOSIS — I10 Essential (primary) hypertension: Secondary | ICD-10-CM

## 2016-05-25 DIAGNOSIS — B070 Plantar wart: Secondary | ICD-10-CM

## 2016-05-25 DIAGNOSIS — E039 Hypothyroidism, unspecified: Secondary | ICD-10-CM | POA: Diagnosis not present

## 2016-05-25 NOTE — Patient Instructions (Signed)
Fall Prevention in the Home Falls can cause injuries and can affect people from all age groups. There are many simple things that you can do to make your home safe and to help prevent falls. What can I do on the outside of my home?  Regularly repair the edges of walkways and driveways and fix any cracks.  Remove high doorway thresholds.  Trim any shrubbery on the main path into your home.  Use bright outdoor lighting.  Clear walkways of debris and clutter, including tools and rocks.  Regularly check that handrails are securely fastened and in good repair. Both sides of any steps should have handrails.  Install guardrails along the edges of any raised decks or porches.  Have leaves, snow, and ice cleared regularly.  Use sand or salt on walkways during winter months.  In the garage, clean up any spills right away, including grease or oil spills. What can I do in the bathroom?  Use night lights.  Install grab bars by the toilet and in the tub and shower. Do not use towel bars as grab bars.  Use non-skid mats or decals on the floor of the tub or shower.  If you need to sit down while you are in the shower, use a plastic, non-slip stool.  Keep the floor dry. Immediately clean up any water that spills on the floor.  Remove soap buildup in the tub or shower on a regular basis.  Attach bath mats securely with double-sided non-slip rug tape.  Remove throw rugs and other tripping hazards from the floor. What can I do in the bedroom?  Use night lights.  Make sure that a bedside light is easy to reach.  Do not use oversized bedding that drapes onto the floor.  Have a firm chair that has side arms to use for getting dressed.  Remove throw rugs and other tripping hazards from the floor. What can I do in the kitchen?  Clean up any spills right away.  Avoid walking on wet floors.  Place frequently used items in easy-to-reach places.  If you need to reach for something above  you, use a sturdy step stool that has a grab bar.  Keep electrical cables out of the way.  Do not use floor polish or wax that makes floors slippery. If you have to use wax, make sure that it is non-skid floor wax.  Remove throw rugs and other tripping hazards from the floor. What can I do in the stairways?  Do not leave any items on the stairs.  Make sure that there are handrails on both sides of the stairs. Fix handrails that are broken or loose. Make sure that handrails are as long as the stairways.  Check any carpeting to make sure that it is firmly attached to the stairs. Fix any carpet that is loose or worn.  Avoid having throw rugs at the top or bottom of stairways, or secure the rugs with carpet tape to prevent them from moving.  Make sure that you have a light switch at the top of the stairs and the bottom of the stairs. If you do not have them, have them installed. What are some other fall prevention tips?  Wear closed-toe shoes that fit well and support your feet. Wear shoes that have rubber soles or low heels.  When you use a stepladder, make sure that it is completely opened and that the sides are firmly locked. Have someone hold the ladder while you are using   it. Do not climb a closed stepladder.  Add color or contrast paint or tape to grab bars and handrails in your home. Place contrasting color strips on the first and last steps.  Use mobility aids as needed, such as canes, walkers, scooters, and crutches.  Turn on lights if it is dark. Replace any light bulbs that burn out.  Set up furniture so that there are clear paths. Keep the furniture in the same spot.  Fix any uneven floor surfaces.  Choose a carpet design that does not hide the edge of steps of a stairway.  Be aware of any and all pets.  Review your medicines with your healthcare provider. Some medicines can cause dizziness or changes in blood pressure, which increase your risk of falling. Talk with  your health care provider about other ways that you can decrease your risk of falls. This may include working with a physical therapist or trainer to improve your strength, balance, and endurance. This information is not intended to replace advice given to you by your health care provider. Make sure you discuss any questions you have with your health care provider. Document Released: 12/22/2001 Document Revised: 05/31/2015 Document Reviewed: 02/05/2014 Elsevier Interactive Patient Education  2017 Elsevier Inc.  

## 2016-05-25 NOTE — Progress Notes (Signed)
Subjective:    Patient ID: Kevin Ho, male    DOB: 02-Sep-1931, 83 y.o.   MRN: [id number]  PT presents to the office today for CPE. PT has not taken any prescriptions in over a month. Pt states he takes an aspirin and melatonin for insomnia that is working.  Hypertension  This is a chronic problem. The current episode started more than 1 year ago. The problem has been waxing and waning since onset. The problem is uncontrolled. Associated symptoms include anxiety, headaches and malaise/fatigue. Pertinent negatives include no blurred vision, palpitations, peripheral edema or shortness of breath. (Dizziness ) Risk factors for coronary artery disease include dyslipidemia, obesity, male gender, sedentary lifestyle and family history. Past treatments include nothing. The current treatment provides mild improvement. Hypertensive end-organ damage includes kidney disease. There is no history of CAD/MI or heart failure. Identifiable causes of hypertension include a thyroid problem.  Thyroid Problem  Presents for follow-up visit. Symptoms include anxiety, constipation, depressed mood and hoarse voice. Patient reports no diarrhea, palpitations or weight gain. The symptoms have been worsening. His past medical history is significant for hyperlipidemia. There is no history of heart failure.  Arthritis  Presents for follow-up visit. He complains of stiffness. Affected locations include the neck (low back). His pain is at a severity of 6/10. Pertinent negatives include no diarrhea.  Insomnia  Primary symptoms: difficulty falling asleep, frequent awakening, malaise/fatigue.  The current episode started more than one year. The onset quality is gradual. The problem occurs intermittently. The symptoms are aggravated by tobacco.  Hyperlipidemia  This is a chronic problem. The current episode started more than 1 year ago. The problem is uncontrolled. Recent lipid tests were reviewed and are high. Factors  aggravating his hyperlipidemia include smoking. Pertinent negatives include no shortness of breath. Current antihyperlipidemic treatment includes diet change. The current treatment provides moderate improvement of lipids.  Anxiety  Presents for follow-up visit. Symptoms include depressed mood, excessive worry, insomnia and nervous/anxious behavior. Patient reports no palpitations or shortness of breath. Symptoms occur most days. The quality of sleep is fair.        Review of Systems  Constitutional: Positive for malaise/fatigue. Negative for weight gain.  HENT: Positive for hoarse voice.   Eyes: Negative for blurred vision.  Respiratory: Negative for shortness of breath.   Cardiovascular: Negative for palpitations.  Gastrointestinal: Positive for constipation. Negative for diarrhea.  Musculoskeletal: Positive for arthritis and stiffness.  Neurological: Positive for headaches.  Psychiatric/Behavioral: The patient is nervous/anxious and has insomnia.   All other systems reviewed and are negative.      Objective:   Physical Exam  Constitutional: He is oriented to person, place, and time. He appears well-developed and well-nourished. No distress.  HENT:  Head: Normocephalic.  Right Ear: External ear normal.  Left Ear: External ear normal.  Thick coating on tongue  Eyes: Pupils are equal, round, and reactive to light. Right eye exhibits no discharge. Left eye exhibits no discharge.  Neck: Normal range of motion. Neck supple. No thyromegaly present.  Cardiovascular: Normal rate, regular rhythm, normal heart sounds and intact distal pulses.   No murmur heard. Pulmonary/Chest: Effort normal and breath sounds normal. No respiratory distress. He has no wheezes.  Abdominal: Soft. Bowel sounds are normal. He exhibits no distension. There is no tenderness.  Musculoskeletal: Normal range of motion. He exhibits no edema or tenderness.  Neurological: He is alert and oriented to person, place,  and time.  Skin: Skin is warm and dry.  No rash noted. No erythema.  Wart on ball on lateral right foot  Psychiatric: He has a normal mood and affect. His behavior is normal. Judgment and thought content normal.  Vitals reviewed.   Cryotherapy on wart on right foot.   BP (!) 152/78   Pulse 74   Temp 97.5 F (36.4 C) (Oral)   Ht _0  (1.676 m)   Wt 136 lb (61.7 kg)   BMI 21.95 kg/m      Assessment & Plan:  1. Essential hypertension - CMP14+EGFR - CBC with Differential/Platelet  2. Hypothyroidism, unspecified type - CMP14+EGFR - CBC with Differential/Platelet - Thyroid Panel With TSH  3. Arthritis - CMP14+EGFR - CBC with Differential/Platelet  4. CKD (chronic kidney disease), stage III - CMP14+EGFR - CBC with Differential/Platelet  5. Tobacco user - CMP14+EGFR - CBC with Differential/Platelet  6. Insomnia, idiopathic - CMP14+EGFR - CBC with Differential/Platelet  7. Hyperlipidemia, unspecified hyperlipidemia type - CMP14+EGFR - CBC with Differential/Platelet  8. Generalized anxiety disorder - CMP14+EGFR - CBC with Differential/Platelet  9. History of smoking greater than 50 pack years - CMP14+EGFR - CBC with Differential/Platelet - DG Chest 2 View; Future  10. Plantar wart of right foot -Do not pick at   Continue all meds Labs pending Health Maintenance reviewed Diet and exercise encouraged RTO 6 months   Evelina Dun, FNP

## 2016-05-26 LAB — CBC WITH DIFFERENTIAL/PLATELET
Basophils Absolute: 0.1 10*3/uL (ref 0.0–0.2)
Basos: 1 %
EOS (ABSOLUTE): 0.6 10*3/uL — ABNORMAL HIGH (ref 0.0–0.4)
EOS: 6 %
HEMOGLOBIN: 15.8 g/dL (ref 13.0–17.7)
Hematocrit: 46 % (ref 37.5–51.0)
IMMATURE GRANS (ABS): 0 10*3/uL (ref 0.0–0.1)
IMMATURE GRANULOCYTES: 0 %
LYMPHS ABS: 1.5 10*3/uL (ref 0.7–3.1)
LYMPHS: 16 %
MCH: 33 pg (ref 26.6–33.0)
MCHC: 34.3 g/dL (ref 31.5–35.7)
MCV: 96 fL (ref 79–97)
MONOCYTES: 10 %
Monocytes Absolute: 0.9 10*3/uL (ref 0.1–0.9)
NEUTROS PCT: 67 %
Neutrophils Absolute: 6.3 10*3/uL (ref 1.4–7.0)
Platelets: 199 10*3/uL (ref 150–379)
RBC: 4.79 x10E6/uL (ref 4.14–5.80)
RDW: 13.2 % (ref 12.3–15.4)
WBC: 9.4 10*3/uL (ref 3.4–10.8)

## 2016-05-26 LAB — CMP14+EGFR
ALBUMIN: 4.6 g/dL (ref 3.5–4.7)
ALK PHOS: 73 IU/L (ref 39–117)
ALT: 13 IU/L (ref 0–44)
AST: 16 IU/L (ref 0–40)
Albumin/Globulin Ratio: 1.5 (ref 1.2–2.2)
BUN / CREAT RATIO: 10 (ref 10–24)
BUN: 15 mg/dL (ref 8–27)
Bilirubin Total: 0.4 mg/dL (ref 0.0–1.2)
CO2: 26 mmol/L (ref 18–29)
CREATININE: 1.44 mg/dL — AB (ref 0.76–1.27)
Calcium: 10.3 mg/dL — ABNORMAL HIGH (ref 8.6–10.2)
Chloride: 96 mmol/L (ref 96–106)
GFR calc Af Amer: 52 mL/min/{1.73_m2} — ABNORMAL LOW (ref >59)
GFR calc non Af Amer: 45 mL/min/{1.73_m2} — ABNORMAL LOW (ref >59)
GLUCOSE: 77 mg/dL (ref 65–99)
Globulin, Total: 3 g/dL (ref 1.5–4.5)
Potassium: 5.5 mmol/L — ABNORMAL HIGH (ref 3.5–5.2)
Sodium: 137 mmol/L (ref 134–144)
TOTAL PROTEIN: 7.6 g/dL (ref 6.0–8.5)

## 2016-05-26 LAB — THYROID PANEL WITH TSH
FREE THYROXINE INDEX: 1.5 (ref 1.2–4.9)
T3 UPTAKE RATIO: 29 % (ref 24–39)
T4 TOTAL: 5 ug/dL (ref 4.5–12.0)
TSH: 14.83 u[IU]/mL — ABNORMAL HIGH (ref 0.450–4.500)

## 2016-05-28 ENCOUNTER — Other Ambulatory Visit: Payer: Self-pay | Admitting: Family

## 2016-05-28 ENCOUNTER — Telehealth: Payer: Self-pay | Admitting: *Deleted

## 2016-05-28 DIAGNOSIS — E875 Hyperkalemia: Secondary | ICD-10-CM

## 2016-05-28 DIAGNOSIS — R911 Solitary pulmonary nodule: Secondary | ICD-10-CM

## 2016-05-28 MED ORDER — LEVOTHYROXINE SODIUM 75 MCG PO TABS: 75.0000 ug | ORAL_TABLET | Freq: Every day | ORAL | 1 refills | Status: DC

## 2016-05-28 NOTE — Telephone Encounter (Signed)
Notified pt and CT scan ordered

## 2016-05-28 NOTE — Telephone Encounter (Signed)
Please review CXR and advise.

## 2016-05-29 ENCOUNTER — Ambulatory Visit (HOSPITAL_COMMUNITY): Payer: Medicare HMO

## 2016-06-01 ENCOUNTER — Other Ambulatory Visit: Payer: Medicare HMO

## 2016-06-01 ENCOUNTER — Ambulatory Visit (HOSPITAL_COMMUNITY)
Admission: RE | Admit: 2016-06-01 | Discharge: 2016-06-01 | Disposition: A | Discharge status: Home / Self Care | Payer: Medicare HMO | Source: Ambulatory Visit | Attending: Family | Admitting: Family

## 2016-06-01 ENCOUNTER — Encounter (HOSPITAL_COMMUNITY): Payer: Self-pay

## 2016-06-01 DIAGNOSIS — R911 Solitary pulmonary nodule: Secondary | ICD-10-CM

## 2016-06-01 DIAGNOSIS — I2510 Atherosclerotic heart disease of native coronary artery without angina pectoris: Secondary | ICD-10-CM | POA: Insufficient documentation

## 2016-06-01 DIAGNOSIS — J479 Bronchiectasis, uncomplicated: Secondary | ICD-10-CM | POA: Diagnosis not present

## 2016-06-01 DIAGNOSIS — R918 Other nonspecific abnormal finding of lung field: Secondary | ICD-10-CM | POA: Diagnosis not present

## 2016-06-01 DIAGNOSIS — I700 Atherosclerosis of aorta: Secondary | ICD-10-CM | POA: Diagnosis not present

## 2016-06-01 DIAGNOSIS — N281 Cyst of kidney, acquired: Secondary | ICD-10-CM | POA: Diagnosis not present

## 2016-06-01 DIAGNOSIS — J439 Emphysema, unspecified: Secondary | ICD-10-CM | POA: Diagnosis not present

## 2016-06-01 DIAGNOSIS — E875 Hyperkalemia: Secondary | ICD-10-CM

## 2016-06-01 MED ORDER — IOPAMIDOL (ISOVUE-300) INJECTION 61%
INTRAVENOUS | Status: AC
  Administered 2016-06-01: 75 mL
  Filled 2016-06-01: qty 75

## 2016-06-02 LAB — BMP8+EGFR
BUN / CREAT RATIO: 9 — AB (ref 10–24)
BUN: 14 mg/dL (ref 8–27)
CALCIUM: 10 mg/dL (ref 8.6–10.2)
CHLORIDE: 97 mmol/L (ref 96–106)
CO2: 21 mmol/L (ref 18–29)
Creatinine, Ser: 1.54 mg/dL — ABNORMAL HIGH (ref 0.76–1.27)
GFR calc Af Amer: 48 mL/min/{1.73_m2} — ABNORMAL LOW (ref >59)
GFR calc non Af Amer: 41 mL/min/{1.73_m2} — ABNORMAL LOW (ref >59)
GLUCOSE: 104 mg/dL — AB (ref 65–99)
Potassium: 4.6 mmol/L (ref 3.5–5.2)
Sodium: 140 mmol/L (ref 134–144)

## 2016-06-07 ENCOUNTER — Telehealth: Payer: Self-pay | Admitting: Family Medicine

## 2016-06-07 ENCOUNTER — Other Ambulatory Visit: Payer: Self-pay | Admitting: Family

## 2016-06-07 DIAGNOSIS — R911 Solitary pulmonary nodule: Secondary | ICD-10-CM

## 2016-06-08 ENCOUNTER — Telehealth: Payer: Self-pay

## 2016-06-08 NOTE — Telephone Encounter (Signed)
[name] called about the [facility] referral. Wanted to make sure we contact her since pt has trouble hearing. Number- [phone]

## 2016-06-08 NOTE — Telephone Encounter (Signed)
Attempted to return call- NA. I do not see where any one has called pt in the chart.

## 2016-06-21 ENCOUNTER — Ambulatory Visit (HOSPITAL_COMMUNITY): Payer: Medicare HMO

## 2016-06-22 ENCOUNTER — Encounter (HOSPITAL_COMMUNITY): Payer: Medicare HMO | Attending: Hematology | Admitting: Hematology

## 2016-06-22 ENCOUNTER — Encounter (HOSPITAL_COMMUNITY): Payer: Self-pay

## 2016-06-22 VITALS — BP 148/63 | HR 63 | Temp 97.6°F | Resp 18 | Ht 65.0 in | Wt 137.3 lb

## 2016-06-22 DIAGNOSIS — C3412 Malignant neoplasm of upper lobe, left bronchus or lung: Secondary | ICD-10-CM

## 2016-06-22 DIAGNOSIS — Z85038 Personal history of other malignant neoplasm of large intestine: Secondary | ICD-10-CM | POA: Diagnosis not present

## 2016-06-22 DIAGNOSIS — J449 Chronic obstructive pulmonary disease, unspecified: Secondary | ICD-10-CM

## 2016-06-22 DIAGNOSIS — E039 Hypothyroidism, unspecified: Secondary | ICD-10-CM

## 2016-06-22 DIAGNOSIS — R918 Other nonspecific abnormal finding of lung field: Secondary | ICD-10-CM

## 2016-06-22 NOTE — Patient Instructions (Signed)
[facility] Discharge Instructions  RECOMMENDATIONS MADE BY THE CONSULTANT AND ANY TEST RESULTS WILL BE SENT TO YOUR REFERRING PHYSICIAN.  You saw Dr. Kale today.  Thank you for choosing [facility] at [facility] to provide your oncology and hematology care.  To afford each patient quality time with our provider, please arrive at least 15 minutes before your scheduled appointment time.    If you have a lab appointment with the Cancer Center please come in thru the  Main Entrance and check in at the main information desk  You need to re-schedule your appointment should you arrive 10 or more minutes late.  We strive to give you quality time with our providers, and arriving late affects you and other patients whose appointments are after yours.  Also, if you no show three or more times for appointments you may be dismissed from the clinic at the providers discretion.     Again, thank you for choosing [facility].  Our hope is that these requests will decrease the amount of time that you wait before being seen by our physicians.       _____________________________________________________________  Should you have questions after your visit to [facility], please contact our office at [phone] between the hours of 8:30 a.m. and 4:30 p.m.  Voicemails left after 4:30 p.m. will not be returned until the following business day.  For prescription refill requests, have your pharmacy contact our office.       Resources For Cancer Patients and their Caregivers ? American Cancer Society: Can assist with transportation, wigs, general needs, runs Look Good Feel Better.        1-888-227-6333 ? Cancer Care: Provides financial assistance, online support groups, medication/co-pay assistance.  1-800-813-HOPE (4673) ? [facility] Assists [place] cancer patients and their families through  emotional , educational and financial support.  336-427-4357 ? [place] DSS Where to apply for food stamps, Medicaid and utility assistance. 336-342-1394 ? [facility]: Transportation to medical appointments. 336-347-2287 ? Social Security Administration: May apply for disability if have a Stage IV cancer. 336-342-7796 1-800-772-1213 ? [place] Aging, Disability and Transit Services: Assists with nutrition, care and transit needs. 336-349-2343  Cancer Center Support Programs: @10RELATIVEDAYS@ > Cancer Support Group  2nd Tuesday of the month 1pm-2pm, Journey Room  > Creative Journey  3rd Tuesday of the month 1130am-1pm, Journey Room  > Look Good Feel Better  1st Wednesday of the month 10am-12 noon, Journey Room (Call American Cancer Society to register 1-800-395-5775)    

## 2016-06-22 NOTE — Progress Notes (Signed)
Marland Kitchen    HEMATOLOGY/ONCOLOGY CONSULTATION NOTE  Date of Service: 06/22/2016  Patient Care Team: [name], MD as PCP - General (Family Medicine)  CHIEF COMPLAINTS/PURPOSE OF CONSULTATION:   Concern for lung cancer  HISTORY OF PRESENTING ILLNESS:   Kevin Ho is a 83 y.o. male who has been referred to [place] by Dr .[name], MD for evaluation and management likely newly diagnosed lung cancer.  Patient is a 83 yo male with history of head and neck squamous cell carcinoma [details unknown,  notes he poorly tolerated chemotherapy and radiation], alcohol abuse, osteoporosis, senile purpura, active smoker one pack per day for 70 years, chronic kidney disease , COPD, hypothyroidism, "mass in the brain since 2005 ?meningioma" and dementia.  Patient is very restless and is unable to sit in one place and left multiple times in the middle of the interview. He seems to have limited understanding and has son is helping him make decisions. Poor historian.  Patient has a chronic cough due to smoking and had a chest x-ray with his primary care physician on 05/28/2016 which showed a new nodular density in the left upper lobe. He subsequently had a CT of the chest with contrast on 06/01/2016 which showed Left upper lobe nodule measuring 11 mm is suspicious for malignancy. Consider follow-up PET-CT or tissue sampling. Interval development of small mediastinal and hilar lymph nodes.  Concern for lung cancer metastatic to lymph nodes were discussed with the patient and his accompanying son and daughter-in-law. Patient was restless and pacing the room and left the room several times. He was uncertain about even getting a biopsy or considering treatments. We discussed that with his ongoing smoking, COPD -emphysema and bronchiectasis on CT and dementia he would not be a good candidate for aggressive treatments.  After extensive discussion of goals of care the patient and his son are agreeable with pursuing  a PET/CT scan to determine the extent of his cancer. If it was a single lung nodule and stage I disease with discussed that one could possibly consider SBRT. If he has lymph node involvement and stage III disease he would not be a good candidate for concurrent chemotherapy and radiation and might need to consider possible palliative radiation versus best supportive cares.  Patient has significant emphysema and we would like to look at the PET/CT scan prior to deciding the best option for tissue diagnosis.  Patient is unable to quantify any weight loss. Notes no hemoptysis. Chronic fatigue.  MEDICAL HISTORY:  Past Medical History:  Diagnosis Date  . Alcoholism ([place])   . Anxiety   . Arthritis    neck and lumbar spine per pt  . Cancer ([place])    In his neck area  . Chronic pain   . Glaucoma    per pt  . Hyperlipidemia   . Hypertension   . Insomnia   . Varicose veins     SURGICAL HISTORY: Past Surgical History:  Procedure Laterality Date  . EYE SURGERY Bilateral    cataracts  . porta cath     removed in 2012  . right inguinal hernia      SOCIAL HISTORY: Social History   Social History  . Marital status: Widowed    Spouse name: N/A  . Number of children: 3  . Years of education: N/A   Occupational History  . retired    Social History Main Topics  . Smoking status: Current Every Day Smoker    Packs/day: 1.00  Types: Cigarettes  . Smokeless tobacco: Never Used     Comment: -started age 4  . Alcohol use No  . Drug use: No  . Sexual activity: No   Other Topics Concern  . Not on file   Social History Narrative  . No narrative on file    FAMILY HISTORY: Family History  Problem Relation Age of Onset  . Diabetes Son   . Hypertension Mother   . Hypertension Father   . Colon cancer Neg Hx     ALLERGIES:  is allergic to chantix [varenicline].  MEDICATIONS:  Current Outpatient Prescriptions  Medication Sig Dispense Refill  . donepezil (ARICEPT) 5 MG  tablet TAKE ONE TABLET BY MOUTH ONCE DAILY AT BEDTIME 30 tablet 0  . levothyroxine (SYNTHROID, LEVOTHROID) 75 MCG tablet Take 1 tablet (75 mcg total) by mouth daily before breakfast. 90 tablet 1  . temazepam (RESTORIL) 30 MG capsule Take 1 capsule (30 mg total) by mouth at bedtime. 30 capsule 0   No current facility-administered medications for this visit.     REVIEW OF SYSTEMS:    10 Point review of Systems was done is negative except as noted above.  PHYSICAL EXAMINATION: ECOG PERFORMANCE STATUS: 2-3  . Vitals:   06/22/16 0846  BP: (!) 148/63  Pulse: 63  Resp: 18  Temp: 97.6 F (36.4 C)   Filed Weights   06/22/16 0846  Weight: 137 lb 4.8 oz (62.3 kg)   .Body mass index is 22.85 kg/m.  GENERAL:alert,Restless, poor historian due to dementia  EYES: conjunctiva are pink and non-injected, sclera anicteric OROPHARYNX: MMM, no exudates, no oropharyngeal erythema or ulceration NECK: supple, no JVD LYMPH:  no palpable lymphadenopathy in the cervical, axillary or inguinal regions LUNGS: clear to auscultation b/l with normal respiratory effort, decreased air entry bilaterally  HEART: regular rate & rhythm ABDOMEN:  normoactive bowel sounds , non tender, not distended. Extremity: no pedal edema PSYCH: alert & oriented x 3 with fluent speech NEURO: no focal motor/sensory deficits  LABORATORY DATA:  I have reviewed the data as listed  . CBC Latest Ref Rng & Units 05/25/2016 10/01/2014 03/15/2014  WBC 3.4 - 10.8 x10E3/uL 9.4 7.3 10.0  Hemoglobin 13.0 - 17.7 g/dL 15.8 14.7 14.6  Hematocrit 37.5 - 51.0 % 46.0 43.1 45.2  Platelets 150 - 379 x10E3/uL 199 183 -    . CMP Latest Ref Rng & Units 06/01/2016 05/25/2016 10/01/2014  Glucose 65 - 99 mg/dL 104(H) 77 107(H)  BUN 8 - 27 mg/dL 14 15 13   Creatinine 0.76 - 1.27 mg/dL 1.54(H) 1.44(H) 1.28(H)  Sodium 134 - 144 mmol/L 140 137 138  Potassium 3.5 - 5.2 mmol/L 4.6 5.5(H) 4.3  Chloride 96 - 106 mmol/L 97 96 96(L)  CO2 18 - 29 mmol/L  21 26 25   Calcium 8.6 - 10.2 mg/dL 10.0 10.3(H) 10.5(H)  Total Protein 6.0 - 8.5 g/dL - 7.6 7.6  Total Bilirubin 0.0 - 1.2 mg/dL - 0.4 0.4  Alkaline Phos 39 - 117 IU/L - 73 72  AST 0 - 40 IU/L - 16 9  ALT 0 - 44 IU/L - 13 6     RADIOGRAPHIC STUDIES: I have personally reviewed the radiological images as listed and agreed with the findings in the report. Dg Chest 2 View  Result Date: 05/28/2016 CLINICAL DATA:  Tobacco use. EXAM: CHEST  2 VIEW COMPARISON:  Radiographs of March 25, 2009. FINDINGS: The heart size and mediastinal contours are within normal limits. Right lung is clear. New  nodular density is noted in left upper lobe. No pneumothorax or pleural effusion is noted. Atherosclerosis of thoracic aorta is noted. Left subclavian Port-A-Cath noted on prior exam has been removed. Old right rib fractures are noted. IMPRESSION: Aortic atherosclerosis. New nodular density seen in left upper lobe ; CT scan of the chest is recommended to rule out neoplasm. These results will be called to the ordering clinician or representative by the Radiologist Assistant, and communication documented in the PACS or zVision Dashboard. Electronically Signed   By: Marijo Conception, M.D.   On: 05/28/2016 09:27   Ct Chest W Contrast  Result Date: 06/07/2016 CLINICAL DATA:  Follow-up lung nodule.  Smoker. EXAM: CT CHEST WITH CONTRAST TECHNIQUE: Multidetector CT imaging of the chest was performed during intravenous contrast administration. CONTRAST:  20mL ISOVUE-300 IOPAMIDOL (ISOVUE-300) INJECTION 61% COMPARISON:  Chest x-ray 05/25/2016, chest CT 03/03/2012 FINDINGS: Cardiovascular: There is extensive coronary artery disease. Heart size is normal. No pericardial effusion. And and on and there is atherosclerotic calcification of the thoracic aorta. Normal arch anatomy. Mediastinum/Nodes: Subcarinal lymph node is 9 mm in short axis. Right hilar lymph node is 7 mm. New left hilar lymph node is 11 mm. Left AP window lymph node  is 7 mm. Lungs/Pleura: Within the left upper lobe there is a spiculated solid nodule measuring 11 mm on image 61 of series 5. Previously this measured 4 mm. There is bronchiectasis in the upper lobes bilaterally. Scattered emphysematous changes are present. No focal consolidations or pleural effusions. Upper Abdomen: Numerous bilateral renal cysts. The adrenal glands are normal in appearance. There is atherosclerotic calcification of the abdominal aorta. Musculoskeletal: Degenerative changes are seen in thoracic spine. Multiple remote rib fractures. No suspicious lytic or blastic lesions are identified. IMPRESSION: 1. Left upper lobe nodule measuring 11 mm is suspicious for malignancy. Consider follow-up PET-CT or tissue sampling. 2. Interval development of small mediastinal and hilar lymph nodes, raising the question of metastatic disease. 3. Emphysema and bronchiectasis. 4. Bilateral renal cysts. 5. Coronary artery disease. 6.  Aortic atherosclerosis. 7. Remote rib fractures. 8. These results will be called to the ordering clinician or representative by the Radiologist Assistant, and communication documented in the PACS or zVision Dashboard. Electronically Signed   By: Nolon Nations M.D.   On: 06/07/2016 10:26    ASSESSMENT & PLAN:   83 yo male with multiple medical comorbidities including dementia , significant COPD and ongoing symptom at smoking   #1 concern for newly diagnosed Stage III lung cancer with left upper lobe regular lung nodule and mediastinal and hilar lymphadenopathy. Tissue diagnosis is not available yet. Plan - Imaging findings were discussed in with the patient and his son who is helping him make decisions due to his own dementia. -We discussed the likely possibilities. -We discussed the fact that if this does represent stage III non-small cell lung cancer that he would not be a good candidate for continent chemotherapy and radiation. He notes that he has had a history of head and  neck squamous cell carcinoma and poorly with chemotherapy and radiation in the past and now he is much older and and not as good health status. -His pulmonary function may also be limiting. -We shall get a PET CT scan to better evaluate the staging of his disease and to help plan appropriate approach for biopsy and tissue diagnosis. Percutaneous biopsy might be risky proposition given his emphysema and inability to sit still. -Pet/ct in 1 week RTC with Dr Maylon Peppers in  2 weeks with PET/CT to plan appropriate biopsy options and continue discussion of goals of care and if the patient wants a tissue diagnosis and other specific palliative treatments .  #2 . Patient Active Problem List   Diagnosis Date Noted  . Mass of left lung 07/02/2016  . Essential hypertension 04/11/2015  . Generalized anxiety disorder 12/13/2014  . Insomnia, idiopathic 12/13/2014  . Squamous cell carcinoma of skin 12/08/2014  . Dysphagia 07/23/2013  . Occult blood in stools 07/23/2013  . Normocytic anemia 07/23/2013  . CKD (chronic kidney disease), stage III 04/24/2013  . Varicose veins   . Arthritis   . Hypercalcemia 03/13/2013  . Tobacco user 03/13/2013  . Hypothyroidism 01/27/2013  . Hyperlipidemia 06/20/2012   Plan - continue to follow-up with primary care physician for management of other medical comorbidities   . Orders Placed This Encounter  Procedures  . NM PET Image Initial (PI) Skull Base To Thigh    Standing Status:   Future    Number of Occurrences:   1    Standing Expiration Date:   06/22/2017    Order Specific Question:   Reason for Exam (SYMPTOM  OR DIAGNOSIS REQUIRED)    Answer:   Initial staging of newly diagnosed lung cancer. h/o head and neck cancer.    Order Specific Question:   If indicated for the ordered procedure, I authorize the administration of a radiopharmaceutical per Radiology protocol    Answer:   Yes    Order Specific Question:   Preferred imaging location?    Answer:   [facility]    Order Specific Question:   Radiology Contrast Protocol - do NOT remove file path    Answer:   \\[email or web address]     All of the patients questions were answered with apparent satisfaction. The patient knows to call the clinic with any problems, questions or concerns.  I spent 45 minutes counseling the patient face to face. The total time spent in the appointment was 60 minutes and more than 50% was on counseling and direct patient cares.    Sullivan Lone MD [place] AAHIVMS [facility] Hematology/Oncology Physician [facility]  (Office):       [phone] (Work cell):  [phone] (Fax):           [phone]  06/22/2016 9:21 AM

## 2016-06-29 ENCOUNTER — Encounter (HOSPITAL_COMMUNITY)
Admission: RE | Admit: 2016-06-29 | Discharge: 2016-06-29 | Disposition: A | Discharge status: Home / Self Care | Payer: Medicare HMO | Source: Ambulatory Visit | Attending: Hematology | Admitting: Hematology

## 2016-06-29 DIAGNOSIS — R911 Solitary pulmonary nodule: Secondary | ICD-10-CM | POA: Diagnosis not present

## 2016-06-29 DIAGNOSIS — C3412 Malignant neoplasm of upper lobe, left bronchus or lung: Secondary | ICD-10-CM | POA: Diagnosis not present

## 2016-06-29 LAB — GLUCOSE, CAPILLARY: Glucose-Capillary: 92 mg/dL (ref 65–99)

## 2016-06-29 MED ORDER — FLUDEOXYGLUCOSE F - 18 (FDG) INJECTION
6.8200 | Freq: Once | INTRAVENOUS | Status: AC | PRN
  Administered 2016-06-29: 6.82 via INTRAVENOUS

## 2016-07-02 ENCOUNTER — Encounter (HOSPITAL_BASED_OUTPATIENT_CLINIC_OR_DEPARTMENT_OTHER): Payer: Medicare HMO | Admitting: Oncology

## 2016-07-02 ENCOUNTER — Encounter (HOSPITAL_COMMUNITY): Payer: Self-pay

## 2016-07-02 DIAGNOSIS — R110 Nausea: Secondary | ICD-10-CM | POA: Diagnosis not present

## 2016-07-02 DIAGNOSIS — R42 Dizziness and giddiness: Secondary | ICD-10-CM | POA: Diagnosis not present

## 2016-07-02 DIAGNOSIS — Z720 Tobacco use: Secondary | ICD-10-CM

## 2016-07-02 DIAGNOSIS — R1310 Dysphagia, unspecified: Secondary | ICD-10-CM | POA: Diagnosis not present

## 2016-07-02 DIAGNOSIS — R918 Other nonspecific abnormal finding of lung field: Secondary | ICD-10-CM | POA: Diagnosis not present

## 2016-07-02 DIAGNOSIS — Z8501 Personal history of malignant neoplasm of esophagus: Secondary | ICD-10-CM | POA: Diagnosis not present

## 2016-07-02 MED ORDER — ONDANSETRON HCL 8 MG PO TABS: 8.0000 mg | ORAL_TABLET | Freq: Three times a day (TID) | ORAL | 0 refills | Status: DC | PRN

## 2016-07-02 NOTE — Progress Notes (Signed)
[facility] Follow up:    Kevin Fraise, MD [place] [address]    SUMMARY OF ONCOLOGIC HISTORY: LUL lung mass   INTERVAL HISTORY: Kevin Ho 83 y.o. male returns for continued follow-up of his left upper lobe lung mass.  CT chest w/ contrast on 06/01/16 demonstrated a left upper lobe nodule measuring 11 mm is suspicious for malignancy.  His previous CT chest without contrast in February 2010 demonstrated that the left upper lobe mass measured 2-3 mm. PET on 06/29/16: 1. Hypermetabolic spiculated left upper lobe pulmonary nodule is most consistent with primary bronchogenic carcinoma. 2. Prevascular left suprahilar nodal metastasis. Presuming non-small-cell histology, T1aN2M0 or stage IIIA.  He complains of chronic dysphagia. He's been eating soft foods and drinking liquids. He also has intermittent nausea. He's been also having intermittent dizziness. Otherwise denies any shortness of breath or chest pain or abdominal pain.  Patient Active Problem List   Diagnosis Date Noted  . Mass of left lung 07/02/2016  . Essential hypertension 04/11/2015  . Generalized anxiety disorder 12/13/2014  . Insomnia, idiopathic 12/13/2014  . Squamous cell carcinoma of skin 12/08/2014  . Dysphagia 07/23/2013  . Occult blood in stools 07/23/2013  . Normocytic anemia 07/23/2013  . CKD (chronic kidney disease), stage III 04/24/2013  . Varicose veins   . Arthritis   . Hypercalcemia 03/13/2013  . Tobacco user 03/13/2013  . Hypothyroidism 01/27/2013  . Hyperlipidemia 06/20/2012    is allergic to chantix [varenicline].  MEDICAL HISTORY: Past Medical History:  Diagnosis Date  . Alcoholism ([place])   . Anxiety   . Arthritis    neck and lumbar spine per pt  . Cancer ([place])    In his neck area  . Chronic pain   . Glaucoma    per pt  . Hyperlipidemia   . Hypertension   . Insomnia   . Varicose veins     SURGICAL HISTORY: Past Surgical History:   Procedure Laterality Date  . EYE SURGERY Bilateral    cataracts  . porta cath     removed in 2012  . right inguinal hernia      SOCIAL HISTORY: Social History   Social History  . Marital status: Widowed    Spouse name: N/A  . Number of children: 3  . Years of education: N/A   Occupational History  . retired    Social History Main Topics  . Smoking status: Current Every Day Smoker    Packs/day: 1.00    Types: Cigarettes  . Smokeless tobacco: Never Used     Comment: -started age 48  . Alcohol use No  . Drug use: No  . Sexual activity: No   Other Topics Concern  . Not on file   Social History Narrative  . No narrative on file    FAMILY HISTORY: Family History  Problem Relation Age of Onset  . Diabetes Son   . Hypertension Mother   . Hypertension Father   . Colon cancer Neg Hx     Review of Systems  Constitutional: Negative for appetite change, chills, fatigue and fever.  HENT:   Negative for hearing loss, lump/mass, mouth sores, sore throat and tinnitus.   Eyes: Negative for eye problems and icterus.  Respiratory: Negative for chest tightness, cough, hemoptysis, shortness of breath and wheezing.   Cardiovascular: Negative for chest pain, leg swelling and palpitations.  Gastrointestinal: Negative for abdominal distention, abdominal pain, blood in stool, diarrhea, nausea and vomiting.  Dysphagia, nausea  Endocrine: Negative.  Negative for hot flashes.  Genitourinary: Negative for difficulty urinating, frequency and hematuria.   Musculoskeletal: Negative for arthralgias and neck pain.  Skin: Negative for itching and rash.  Neurological: Positive for dizziness. Negative for headaches and speech difficulty.  Hematological: Negative for adenopathy. Does not bruise/bleed easily.  Psychiatric/Behavioral: Negative for confusion. The patient is not nervous/anxious.       PHYSICAL EXAMINATION  ECOG PERFORMANCE STATUS: 2 - Symptomatic, <50% confined to  bed  Vitals:   07/02/16 1450  BP: 130/67  Pulse: 73  Resp: 18    Physical Exam  Constitutional: He is oriented to person, place, and time. No distress.  Thin elderly man  HENT:  Head: Normocephalic and atraumatic.  Mouth/Throat: No oropharyngeal exudate.  Eyes: Conjunctivae are normal. Pupils are equal, round, and reactive to light. No scleral icterus.  Neck: Normal range of motion. Neck supple. No JVD present.  Cardiovascular: Normal rate, regular rhythm and normal heart sounds.  Exam reveals no gallop and no friction rub.   No murmur heard. Pulmonary/Chest: Breath sounds normal. No respiratory distress. He has no wheezes. He has no rales.  Abdominal: Soft. Bowel sounds are normal. He exhibits no distension. There is no tenderness. There is no guarding.  Musculoskeletal: He exhibits no edema or tenderness.  Lymphadenopathy:    He has no cervical adenopathy.  Neurological: He is alert and oriented to person, place, and time. No cranial nerve deficit.  Skin: Skin is warm and dry. No rash noted. No erythema. No pallor.  Psychiatric: Affect and judgment normal.    LABORATORY DATA:  CBC    Component Value Date/Time   WBC 9.4 05/25/2016 1251   WBC 10.0 03/15/2014 1052   WBC 8.1 04/27/2013 1129   RBC 4.79 05/25/2016 1251   RBC 4.72 03/15/2014 1052   RBC 4.03 (L) 04/27/2013 1129   HGB 15.8 05/25/2016 1251   HCT 46.0 05/25/2016 1251   PLT 199 05/25/2016 1251   MCV 96 05/25/2016 1251   MCH 33.0 05/25/2016 1251   MCH 30.9 03/15/2014 1052   MCH 31.5 04/27/2013 1129   MCHC 34.3 05/25/2016 1251   MCHC 32.3 03/15/2014 1052   MCHC 34.4 04/27/2013 1129   RDW 13.2 05/25/2016 1251   LYMPHSABS 1.5 05/25/2016 1251   MONOABS 1.0 04/27/2013 1129   EOSABS 0.6 (H) 05/25/2016 1251   BASOSABS 0.1 05/25/2016 1251    CMP     Component Value Date/Time   NA 140 06/01/2016 1501   K 4.6 06/01/2016 1501   CL 97 06/01/2016 1501   CO2 21 06/01/2016 1501   GLUCOSE 104 (H) 06/01/2016 1501    GLUCOSE 123 (H) 04/27/2013 1129   BUN 14 06/01/2016 1501   CREATININE 1.54 (H) 06/01/2016 1501   CREATININE 1.49 (H) 06/23/2012 1639   CALCIUM 10.0 06/01/2016 1501   PROT 7.6 05/25/2016 1251   ALBUMIN 4.6 05/25/2016 1251   AST 16 05/25/2016 1251   ALT 13 05/25/2016 1251   ALKPHOS 73 05/25/2016 1251   BILITOT 0.4 05/25/2016 1251   GFRNONAA 41 (L) 06/01/2016 1501   GFRNONAA 45 (L) 06/20/2012 1048   GFRAA 48 (L) 06/01/2016 1501   GFRAA 52 (L) 06/20/2012 1048    ASSESSMENT and THERAPY PLAN:  Spiculated hypermetabolic LUL lung mass with hypermetabolic LNs suspicious for lung cancer.  PLAN: I have reviewed patient's PET scan in detail with him and his family today. I will send him to interventional pulmonology for EBUS for biopsy  of his lymph nodes as well as left upper lobe lung mass further with definitive tissue diagnosis.  I doubt patient will tolerate chemotherapy well since patient's family states that he did terribly with his previous chemotherapy for his esophageal cancer. He may be a candidate for radiation alone. Stat MRI brain with and without contrast for complete staging for suspected lung cancer, especially given that he is having symptoms with dizziness and balance issues. I will send patient to GI status for esophageal dilatation which may help with his nutritional status. I have sent in a prescription for Zofran when necessary for his nausea. Return to clinic in 2 weeks for follow-up and to discuss next plan of care.  Orders Placed This Encounter  Procedures  . MR Brain W Wo Contrast    Patient can leave after procedure per provider    Standing Status:   Future    Standing Expiration Date:   07/02/2017    Order Specific Question:   If indicated for the ordered procedure, I authorize the administration of contrast media per Radiology protocol    Answer:   Yes    Order Specific Question:   Reason for Exam (SYMPTOM  OR DIAGNOSIS REQUIRED)    Answer:   dizziness,  suspected lung cancer on PET, r/o brain mets    Order Specific Question:   What is the patient's sedation requirement?    Answer:   No Sedation    Order Specific Question:   Does the patient have a pacemaker or implanted devices?    Answer:   No    Order Specific Question:   Preferred imaging location?    Answer:   [facility] (table limit-350lbs)    Order Specific Question:   Call Results- Best Contact Number?    Answer:   [phone]    Order Specific Question:   Radiology Contrast Protocol - do NOT remove file path    Answer:   \\[email or web address]    All questions were answered. The patient knows to call the clinic with any problems, questions or concerns. We can certainly see the patient much sooner if necessary. This note was electronically signed. [name], MD 07/02/2016

## 2016-07-03 ENCOUNTER — Ambulatory Visit (HOSPITAL_COMMUNITY)
Admission: RE | Admit: 2016-07-03 | Discharge: 2016-07-03 | Disposition: A | Discharge status: Home / Self Care | Payer: Medicare HMO | Source: Ambulatory Visit | Attending: Oncology | Admitting: Oncology

## 2016-07-03 DIAGNOSIS — I6782 Cerebral ischemia: Secondary | ICD-10-CM | POA: Diagnosis not present

## 2016-07-03 DIAGNOSIS — J323 Chronic sphenoidal sinusitis: Secondary | ICD-10-CM | POA: Insufficient documentation

## 2016-07-03 DIAGNOSIS — R42 Dizziness and giddiness: Secondary | ICD-10-CM | POA: Diagnosis not present

## 2016-07-03 DIAGNOSIS — R918 Other nonspecific abnormal finding of lung field: Secondary | ICD-10-CM | POA: Insufficient documentation

## 2016-07-03 DIAGNOSIS — G319 Degenerative disease of nervous system, unspecified: Secondary | ICD-10-CM | POA: Diagnosis not present

## 2016-07-03 MED ORDER — GADOBENATE DIMEGLUMINE 529 MG/ML IV SOLN
12.0000 mL | Freq: Once | INTRAVENOUS | Status: AC | PRN
  Administered 2016-07-03: 12 mL via INTRAVENOUS

## 2016-07-05 ENCOUNTER — Ambulatory Visit (INDEPENDENT_AMBULATORY_CARE_PROVIDER_SITE_OTHER): Payer: Medicare HMO | Admitting: Pulmonary Disease

## 2016-07-05 ENCOUNTER — Other Ambulatory Visit: Payer: Self-pay | Admitting: Family Medicine

## 2016-07-05 ENCOUNTER — Encounter: Payer: Self-pay | Admitting: Pulmonary Disease

## 2016-07-05 DIAGNOSIS — R918 Other nonspecific abnormal finding of lung field: Secondary | ICD-10-CM

## 2016-07-05 NOTE — Patient Instructions (Signed)
I will review the images from your PET scan with my partner Dr. Lamonte Sakai and we will discuss options for obtaining a tissue diagnosis for your pulmonary nodule and lymphadenopathy. We will call you next week to make arrangements for the next step in your work up

## 2016-07-05 NOTE — Progress Notes (Signed)
Subjective:    Patient ID: Kevin Ho, male    DOB: [birth year], 83 y.o.   MRN: [id number]  HPI Chief Complaint  Patient presents with  . Advice Only    Referred by Dr. Talbert Cage for a biopsy of LUL mass.     Kevin Ho is here to see me because of an abnormal CXR.  He had the CXR as a matter of a routine physical and he didn't have any symptoms at the time.  They had been following the nodule for about a year or so.  He has not lost much weight.    His "throat cancer" was treated with XRT and chemotherapy.  Sounds as if he may of had head and neck cancer but oncology notes refer to esophageal cancer.  He lives independently but on family property with aching keep a close eye on him. He has dementia. He has smoked one to one and a half packs of cigarettes daily for 73 years and continues to smoke. His family states that he becomes very irritable and difficult to manage when he tries to stop smoking. Is able to do some basic activities on his own, he requires supervision and has really no knowledge of why see her today.  He is very adamant that he does not want to have surgery of any kind but they are willing to consider an endoscopy based diagnostic approach.    Past Medical History:  Diagnosis Date  . Alcoholism ([place])   . Anxiety   . Arthritis    neck and lumbar spine per pt  . Cancer ([place])    In his neck area  . Chronic pain   . Glaucoma    per pt  . Hyperlipidemia   . Hypertension   . Insomnia   . Varicose veins      Family History  Problem Relation Age of Onset  . Diabetes Son   . Hypertension Mother   . Hypertension Father   . Rheum arthritis Daughter   . Lupus Grandchild   . Colon cancer Neg Hx      Social History   Social History  . Marital status: Widowed    Spouse name: N/A  . Number of children: 3  . Years of education: N/A   Occupational History  . retired    Social History Main Topics  . Smoking status: Current Every Day Smoker    Packs/day: 1.50      Years: 72.00    Types: Cigarettes    Start date: 01/16/1943  . Smokeless tobacco: Never Used     Comment: -started age 56  . Alcohol use No  . Drug use: No  . Sexual activity: No   Other Topics Concern  . Not on file   Social History Narrative  . No narrative on file     Allergies  Allergen Reactions  . Chantix [Varenicline] Other (See Comments)    Sleep walking      Outpatient Medications Prior to Visit  Medication Sig Dispense Refill  . ondansetron (ZOFRAN) 8 MG tablet Take 1 tablet (8 mg total) by mouth every 8 (eight) hours as needed for nausea or vomiting. 40 tablet 0  . donepezil (ARICEPT) 5 MG tablet TAKE ONE TABLET BY MOUTH ONCE DAILY AT BEDTIME (Patient not taking: Reported on 07/05/2016) 30 tablet 0  . levothyroxine (SYNTHROID, LEVOTHROID) 75 MCG tablet Take 1 tablet (75 mcg total) by mouth daily before breakfast. (Patient not taking: Reported on 07/05/2016) 90 tablet  1   No facility-administered medications prior to visit.       Review of Systems  Constitutional: Negative for fever and unexpected weight change.  HENT: Positive for congestion. Negative for dental problem, ear pain, nosebleeds, postnasal drip, rhinorrhea, sinus pressure, sneezing, sore throat and trouble swallowing.   Eyes: Negative for redness and itching.  Respiratory: Positive for cough, chest tightness and shortness of breath. Negative for wheezing.   Cardiovascular: Negative for palpitations and leg swelling.  Gastrointestinal: Negative for nausea and vomiting.  Genitourinary: Negative for dysuria.  Musculoskeletal: Negative for joint swelling.  Skin: Negative for rash.  Neurological: Negative for headaches.  Hematological: Does not bruise/bleed easily.  Psychiatric/Behavioral: Negative for dysphoric mood. The patient is not nervous/anxious.        Objective:   Physical Exam Vitals:   07/05/16 1006  BP: 120/72  Pulse: 67  SpO2: 100%  Weight: 131 lb 6.4 oz (59.6 kg)  Height: 5\' 4"   (1.626 m)   RA  Gen: thin, chronically ill appearing, no acute distress HENT: NCAT, OP clear, neck supple without masses Eyes: PERRL, EOMi Lymph: no cervical lymphadenopathy PULM: CTA B CV: RRR, no mgr, no JVD GI: BS+, soft, nontender, no hsm Derm: no rash or skin breakdown MSK: normal bulk and tone Neuro: wake and alert but not oriented to situation, CN II-XII intact, strength 5/5 in all 4 extremities Psyche: normal mood and affect  CBC    Component Value Date/Time   WBC 9.4 05/25/2016 1251   WBC 10.0 03/15/2014 1052   WBC 8.1 04/27/2013 1129   RBC 4.79 05/25/2016 1251   RBC 4.72 03/15/2014 1052   RBC 4.03 (L) 04/27/2013 1129   HGB 15.8 05/25/2016 1251   HCT 46.0 05/25/2016 1251   PLT 199 05/25/2016 1251   MCV 96 05/25/2016 1251   MCH 33.0 05/25/2016 1251   MCH 30.9 03/15/2014 1052   MCH 31.5 04/27/2013 1129   MCHC 34.3 05/25/2016 1251   MCHC 32.3 03/15/2014 1052   MCHC 34.4 04/27/2013 1129   RDW 13.2 05/25/2016 1251   LYMPHSABS 1.5 05/25/2016 1251   MONOABS 1.0 04/27/2013 1129   EOSABS 0.6 (H) 05/25/2016 1251   BASOSABS 0.1 05/25/2016 1251      Records from his visit in June 2018 with oncology reviewed. He has a history of esophageal cancer and did "terribly" with chemotherapy. He's been found to have a new left upper lobe mass with mediastinal lymphadenopathy.    Assessment & Plan:  Mass of left lung He has a left upper lobe nodule with level I5 lymphadenopathy and level 10 or 11 lymphadenopathy. It's not clear to me based on my review of the images that the suprahilar lymph nodes will be immediately accessible via endobronchial ultrasound-guided needle aspiration. However, we may be able to reach the pulmonary nodule with navigational bronchoscopy techniques.  While he is not in great health, he can walk on level ground without stopping "all day" according to his family so I believe his anesthesia risk would be low.  He is willing to consider non-chemotherapy  based cancer treatment regimens.  Plan: I'm going to discuss diagnosis options with navigational bronchoscopy with my partner who performs that procedure I will call the patient next week after we've had a chance to discuss the images together.    Current Outpatient Prescriptions:  Marland Kitchen  Melatonin 1 MG CAPS, Take 2 capsules by mouth at bedtime., Disp: , Rfl:  .  ondansetron (ZOFRAN) 8 MG tablet, Take 1 tablet (  8 mg total) by mouth every 8 (eight) hours as needed for nausea or vomiting., Disp: 40 tablet, Rfl: 0 .  donepezil (ARICEPT) 5 MG tablet, TAKE ONE TABLET BY MOUTH ONCE DAILY AT BEDTIME (Patient not taking: Reported on 07/05/2016), Disp: 30 tablet, Rfl: 0 .  levothyroxine (SYNTHROID, LEVOTHROID) 75 MCG tablet, Take 1 tablet (75 mcg total) by mouth daily before breakfast. (Patient not taking: Reported on 07/05/2016), Disp: 90 tablet, Rfl: 1

## 2016-07-05 NOTE — Assessment & Plan Note (Addendum)
He has a left upper lobe nodule with level I5 lymphadenopathy and level 10 or 11 lymphadenopathy. It's not clear to me based on my review of the images that the suprahilar lymph nodes will be immediately accessible via endobronchial ultrasound-guided needle aspiration. However, we may be able to reach the pulmonary nodule with navigational bronchoscopy techniques.  While he is not in great health, he can walk on level ground without stopping "all day" according to his family so I believe his anesthesia risk would be low.  He is willing to consider non-chemotherapy based cancer treatment regimens.  Plan: I'm going to discuss diagnosis options with navigational bronchoscopy with my partner who performs that procedure I will call the patient next week after we've had a chance to discuss the images together.

## 2016-07-10 ENCOUNTER — Telehealth: Payer: Self-pay | Admitting: Pulmonary Disease

## 2016-07-10 DIAGNOSIS — R918 Other nonspecific abnormal finding of lung field: Secondary | ICD-10-CM

## 2016-07-10 NOTE — Telephone Encounter (Signed)
Spoke with pt's daughter-in-law [name] (dpr on file), advised that BQ was going to be talking to RB about scheduling a EBUS and ENB for pt, and that once we spoke with RB we would be contacting them to schedule the biopsy.  [name] expressed understanding.  RB please advise- Per BQ, we need to schedule pt to have an EBUS/ENB performed by RB.  RB/Lindsay please advise on when this can be scheduled.  Thanks!

## 2016-07-11 NOTE — Telephone Encounter (Signed)
thanks

## 2016-07-11 NOTE — Telephone Encounter (Signed)
Orders have been placed for Super D CT and ENB/EBUS per RB.

## 2016-07-12 ENCOUNTER — Other Ambulatory Visit: Payer: Self-pay | Admitting: Emergency Medicine

## 2016-07-13 ENCOUNTER — Ambulatory Visit (INDEPENDENT_AMBULATORY_CARE_PROVIDER_SITE_OTHER): Payer: Medicare HMO | Admitting: Gastroenterology

## 2016-07-13 ENCOUNTER — Ambulatory Visit (INDEPENDENT_AMBULATORY_CARE_PROVIDER_SITE_OTHER)
Admission: RE | Admit: 2016-07-13 | Discharge: 2016-07-13 | Disposition: A | Discharge status: Home / Self Care | Payer: Medicare HMO | Source: Ambulatory Visit | Attending: Emergency Medicine | Admitting: Emergency Medicine

## 2016-07-13 ENCOUNTER — Encounter: Payer: Self-pay | Admitting: Gastroenterology

## 2016-07-13 VITALS — BP 124/72 | HR 68 | Ht 66.0 in | Wt 128.0 lb

## 2016-07-13 DIAGNOSIS — R918 Other nonspecific abnormal finding of lung field: Secondary | ICD-10-CM | POA: Diagnosis not present

## 2016-07-13 DIAGNOSIS — R1310 Dysphagia, unspecified: Secondary | ICD-10-CM | POA: Diagnosis not present

## 2016-07-13 DIAGNOSIS — R911 Solitary pulmonary nodule: Secondary | ICD-10-CM | POA: Diagnosis not present

## 2016-07-13 NOTE — Progress Notes (Addendum)
07/13/2016 Kevin Ho [id number] [birth year]   HISTORY OF PRESENT ILLNESS:  This is an 83 year old male who is here with complaints of chronic dysphagia. He has a history of throat cancer for which he received radiation and chemotherapy in 2005 and has had issues with dysphagia since that time. He tells me that he's been on a pured diet since that time and he has gotten along just fine. His symptoms have not worsened, but it was suggested that he be evaluated by GI. He is now undergoing evaluation for a highly suspicious lesion in his lung.  Recent PET scan did not indicate any sign of malignancy in the esophagus. He has lost a significant amount of weight over the years, but says that he is able to eat anything he wants just fine as long as he puts it in a blender.  According to the family he is somewhat difficult to deal with in regards to getting him to do things that he needs to do for his health, etc.   Past Medical History:  Diagnosis Date  . Alcoholism ([place])   . Anxiety   . Arthritis    neck and lumbar spine per pt  . Cancer ([place])    In his neck area  . Chronic pain   . Glaucoma    per pt  . Hyperlipidemia   . Hypertension   . Insomnia   . Varicose veins    Past Surgical History:  Procedure Laterality Date  . EYE SURGERY Bilateral    cataracts  . porta cath     removed in 2012  . right inguinal hernia      reports that he has been smoking Cigarettes.  He started smoking about 73 years ago. He has a 108.00 pack-year smoking history. He has never used smokeless tobacco. He reports that he does not drink alcohol or use drugs. family history includes Diabetes in his son; Hypertension in his father and mother; Lupus in his grandchild; Rheum arthritis in his daughter. Allergies  Allergen Reactions  . Chantix [Varenicline] Other (See Comments)    Sleep walking       Outpatient Encounter Prescriptions as of 07/13/2016  Medication Sig  . donepezil (ARICEPT) 5 MG  tablet TAKE 1 TABLET BY MOUTH ONCE DAILY AT BEDTIME  . levothyroxine (SYNTHROID, LEVOTHROID) 75 MCG tablet Take 1 tablet (75 mcg total) by mouth daily before breakfast. (Patient not taking: Reported on 07/05/2016)  . Melatonin 1 MG CAPS Take 2 capsules by mouth at bedtime.  . ondansetron (ZOFRAN) 8 MG tablet Take 1 tablet (8 mg total) by mouth every 8 (eight) hours as needed for nausea or vomiting.   No facility-administered encounter medications on file as of 07/13/2016.      REVIEW OF SYSTEMS  : All other systems reviewed and negative except where noted in the History of Present Illness.   PHYSICAL EXAM: BP 124/72   Pulse 68   Ht 5\' 6"  (1.676 m)   Wt 128 lb (58.1 kg)   BMI 20.66 kg/m  General:  Thin and frail white male in no acute distress Head: Normocephalic and atraumatic Eyes:  Sclerae anicteric, conjunctiva pink. Ears: Normal auditory acuity Lungs: Clear throughout to auscultation; no increased WOB. Heart: Regular rate and rhythm Abdomen: Soft, non-distended. Normal bowel sounds.  Non-tender. Musculoskeletal: Symmetrical with no gross deformities  Skin: No lesions on visible extremities Extremities: No edema  Neurological: Alert oriented x 4, grossly non-focal Psychological:  Alert and cooperative.  Normal mood and affect  ASSESSMENT AND PLAN: -This is an 83 year old male who is here with complaints of chronic dysphagia. He has a history of throat cancer for which he received radiation and chemotherapy in 2005 and has had issues with dysphagia since that time. He tells me that he's been on a pured diet since that time and he has gotten along just fine. His symptoms have not worsened, but it was suggested that he be evaluated by GI. He is now undergoing evaluation for a highly suspicious lesion in his lung.  He likely has a stricture related to his previous radiation. I discussed this with the patient and his family regarding the increased risk of dilation due to his age and his  other conditions. He would possibly need several dilations performed before he would get relief of his symptoms depending on how tight the stricture is. After reviewing some of this information the patient declined to have EGD, which is perfectly reasonable at this time. Regardless of of whether we would pursue EGD or not, we should have a barium esophagram performed first to determine location of the stricture, etc. Patient and family are declining to schedule that at this time as well until after the bronchoscopy with lung biopsies performed next week. They will call back here to schedule that study when they are ready.  **25 minutes were spent with the patient and his family, 50% of which was spent reviewing information and discussing further evaluation and treatment options.   CC:  [name], MD  Agree with Ms. Jayveon Convey's management.  Gatha Mayer, MD, [facility]

## 2016-07-13 NOTE — Patient Instructions (Signed)
If you are age 81 or older, your body mass index should be between 23-30. Your Body mass index is 20.66 kg/m. If this is out of the aforementioned range listed, please consider follow up with your Primary Care Provider.  If you are age 70 or younger, your body mass index should be between 19-25. Your Body mass index is 20.66 kg/m. If this is out of the aformentioned range listed, please consider follow up with your Primary Care Provider.   Please call our office back when you are ready to schedule your Esophagram.

## 2016-07-17 ENCOUNTER — Ambulatory Visit (HOSPITAL_COMMUNITY): Payer: Medicare HMO

## 2016-07-20 ENCOUNTER — Other Ambulatory Visit (HOSPITAL_COMMUNITY): Payer: Self-pay | Admitting: *Deleted

## 2016-07-20 ENCOUNTER — Encounter (HOSPITAL_COMMUNITY)
Admission: RE | Admit: 2016-07-20 | Discharge: 2016-07-20 | Disposition: A | Discharge status: Home / Self Care | Payer: Medicare HMO | Source: Ambulatory Visit | Attending: Emergency Medicine | Admitting: Emergency Medicine

## 2016-07-20 ENCOUNTER — Encounter (HOSPITAL_COMMUNITY): Payer: Self-pay

## 2016-07-20 DIAGNOSIS — I2510 Atherosclerotic heart disease of native coronary artery without angina pectoris: Secondary | ICD-10-CM | POA: Diagnosis not present

## 2016-07-20 DIAGNOSIS — I452 Bifascicular block: Secondary | ICD-10-CM | POA: Diagnosis not present

## 2016-07-20 DIAGNOSIS — F17200 Nicotine dependence, unspecified, uncomplicated: Secondary | ICD-10-CM | POA: Diagnosis not present

## 2016-07-20 DIAGNOSIS — E039 Hypothyroidism, unspecified: Secondary | ICD-10-CM | POA: Insufficient documentation

## 2016-07-20 DIAGNOSIS — Z01818 Encounter for other preprocedural examination: Secondary | ICD-10-CM | POA: Diagnosis not present

## 2016-07-20 DIAGNOSIS — F0390 Unspecified dementia without behavioral disturbance: Secondary | ICD-10-CM | POA: Diagnosis not present

## 2016-07-20 DIAGNOSIS — R911 Solitary pulmonary nodule: Secondary | ICD-10-CM | POA: Diagnosis not present

## 2016-07-20 DIAGNOSIS — Z79899 Other long term (current) drug therapy: Secondary | ICD-10-CM | POA: Insufficient documentation

## 2016-07-20 DIAGNOSIS — I4510 Unspecified right bundle-branch block: Secondary | ICD-10-CM | POA: Diagnosis not present

## 2016-07-20 HISTORY — DX: Headache: R51

## 2016-07-20 HISTORY — DX: Hypothyroidism, unspecified: E03.9

## 2016-07-20 HISTORY — DX: Unspecified dementia, unspecified severity, without behavioral disturbance, psychotic disturbance, mood disturbance, and anxiety: F03.90

## 2016-07-20 HISTORY — DX: Headache, unspecified: R51.9

## 2016-07-20 LAB — CBC
HCT: 46.3 % (ref 39.0–52.0)
HEMOGLOBIN: 15.3 g/dL (ref 13.0–17.0)
MCH: 32.2 pg (ref 26.0–34.0)
MCHC: 33 g/dL (ref 30.0–36.0)
MCV: 97.5 fL (ref 78.0–100.0)
Platelets: 192 10*3/uL (ref 150–400)
RBC: 4.75 MIL/uL (ref 4.22–5.81)
RDW: 13.3 % (ref 11.5–15.5)
WBC: 7.3 10*3/uL (ref 4.0–10.5)

## 2016-07-20 LAB — COMPREHENSIVE METABOLIC PANEL
ALK PHOS: 62 U/L (ref 38–126)
ALT: 11 U/L — AB (ref 17–63)
AST: 18 U/L (ref 15–41)
Albumin: 4.3 g/dL (ref 3.5–5.0)
Anion gap: 11 (ref 5–15)
BUN: 20 mg/dL (ref 6–20)
CO2: 24 mmol/L (ref 22–32)
CREATININE: 1.7 mg/dL — AB (ref 0.61–1.24)
Calcium: 9.9 mg/dL (ref 8.9–10.3)
Chloride: 101 mmol/L (ref 101–111)
GFR calc non Af Amer: 36 mL/min — ABNORMAL LOW (ref >60)
GFR, EST AFRICAN AMERICAN: 41 mL/min — AB (ref >60)
GLUCOSE: 111 mg/dL — AB (ref 65–99)
Potassium: 4.5 mmol/L (ref 3.5–5.1)
SODIUM: 136 mmol/L (ref 135–145)
Total Bilirubin: 0.6 mg/dL (ref 0.3–1.2)
Total Protein: 7.4 g/dL (ref 6.5–8.1)

## 2016-07-20 LAB — PROTIME-INR
INR: 1.02
Prothrombin Time: 13.4 seconds (ref 11.4–15.2)

## 2016-07-20 LAB — APTT: aPTT: 39 seconds — ABNORMAL HIGH (ref 24–36)

## 2016-07-20 NOTE — Progress Notes (Signed)
Pt. States no history of cardiac problems.  Denies any cardiac testing

## 2016-07-20 NOTE — Pre-Procedure Instructions (Signed)
Kevin Ho  07/20/2016      [facility] [place], [address] [place] [address] Phone: [phone] Fax: [phone]    Your procedure is scheduled on Wednesday, July 11th.   Report to [facility] Admitting at [place].   Call this number if you have problems the morning of surgery:  [phone]   Remember:  Do not eat food or drink liquids after midnight.   Take these medicines the morning of surgery with A SIP OF WATER: Levothyroxine (Synthroid) and ondansentron (Zofran) if needed.   STOP ASPIRIN,ANTIINFLAMATORIES (IBUPROFEN,ALEVE,MOTRIN,ADVIL,GOODY'S POWDERS),HERBAL SUPPLEMENTS,FISH OIL,AND VITAMINS 5-7 DAYS PRIOR TO SURGERY   Do not wear jewelry, make-up or nail polish.  Do not wear lotions, powders, or perfumes, or deoderant.  Do not shave 48 hours prior to surgery.  Men may shave face and neck.  Do not bring valuables to the hospital.  [facility] is not responsible for any belongings or valuables.  Contacts, dentures or bridgework may not be worn into surgery.  Leave your suitcase in the car.  After surgery it may be brought to your room.  For patients admitted to the hospital, discharge time will be determined by your treatment team.  Patients discharged the day of surgery will not be allowed to drive home.   Special Instructions: [place] - Preparing for Surgery  Before surgery, you can play an important role.  Because skin is not sterile, your skin needs to be as free of germs as possible.  You can reduce the number of germs on you skin by washing with CHG (chlorahexidine gluconate) soap before surgery.  CHG is an antiseptic cleaner which kills germs and bonds with the skin to continue killing germs even after washing.  Please DO NOT use if you have an allergy to CHG or antibacterial soaps.  If your skin becomes reddened/irritated stop using the CHG and inform your nurse when you arrive at Short  Stay.  Do not shave (including legs and underarms) for at least 48 hours prior to the first CHG shower.  You may shave your face.  Please follow these instructions carefully:   1.  Shower with CHG Soap the night before surgery and the   morning of Surgery.  2.  If you choose to wash your hair, wash your hair first as usual with your normal shampoo.  3.  After you shampoo, rinse your hair and body thoroughly to remove the  Shampoo.  4.  Use CHG as you would any other liquid soap.  You can apply chg directly  to the skin and wash gently with scrungie or a clean washcloth.  5.  Apply the CHG Soap to your body ONLY FROM THE NECK DOWN.   Do not use on open wounds or open sores.  Avoid contact with your eyes,  ears, mouth and genitals (private parts).  Wash genitals (private parts) with your normal soap.  6.  Wash thoroughly, paying special attention to the area where your surgery will be performed.  7.  Thoroughly rinse your body with warm water from the neck down.  8.  DO NOT shower/wash with your normal soap after using and rinsing o  the CHG Soap.  9.  Pat yourself dry with a clean towel.            10.  Wear clean pajamas.            11.  Place clean sheets on your bed the night of your first shower and do not sleep with pets.  Day of Surgery  Do not apply any lotions/deodorants the morning of surgery.  Please wear clean clothes to the hospital/surgery center.

## 2016-07-23 ENCOUNTER — Encounter (HOSPITAL_COMMUNITY): Payer: Self-pay

## 2016-07-23 NOTE — Progress Notes (Signed)
Anesthesia Chart Review:  Pt is an 83 year old male scheduled for video bronchoscopy with endobronchial navigation, endobronchial ultrasound on 07/25/2016 with Baltazar Apo, M.D.  - PCP is [name], MD  PMH includes: HTN, hyperlipidemia, hx alcoholism (reports no current alcohol use), hypothyroidism, glaucoma, dementia. Current smoker. BMI 22.  Medications include: Aricept, levothyroxine  BP 137/63   Pulse 64   Temp 36.6 C (Oral)   Resp 18   Ht 5\' 5"  (1.651 m)   Wt 130 lb 4.7 oz (59.1 kg)   SpO2 99%   BMI 21.68 kg/m    Preoperative labs reviewed. - CR 1.7, BUN 20. CR slightly higher than usual 1.4-1.5. I notified daughter-in-law [name] to have pt increase fluid intake.  - PT normal, PTT 39  CT super D chest 07/13/16:  1. Imaging for bronchoscopy guidance. 2. Stable spiculated left upper lobe nodule consistent with bronchogenic carcinoma. Left hilar and AP window adenopathy appears unchanged. 3. No acute findings. 4. Extensive coronary artery atherosclerosis. Aortic Atherosclerosis  EKG 07/20/16: Sinus rhythm with marked sinus arrhythmia.  Left anterior hemiblock. RBBB  If no changes, I anticipate pt can proceed with surgery as scheduled.   Willeen Cass, FNP-BC [facility] Short Stay Surgical Center/Anesthesiology Phone: [phone] 07/23/2016 12:00 PM

## 2016-07-25 ENCOUNTER — Ambulatory Visit (HOSPITAL_COMMUNITY): Payer: Medicare HMO

## 2016-07-25 ENCOUNTER — Ambulatory Visit (HOSPITAL_COMMUNITY): Payer: Medicare HMO | Admitting: Emergency Medicine

## 2016-07-25 ENCOUNTER — Ambulatory Visit (HOSPITAL_COMMUNITY): Payer: Medicare HMO | Admitting: Certified Registered"

## 2016-07-25 ENCOUNTER — Ambulatory Visit (HOSPITAL_COMMUNITY)
Admission: RE | Admit: 2016-07-25 | Discharge: 2016-07-25 | Disposition: A | Discharge status: Home / Self Care | Payer: Medicare HMO | Source: Ambulatory Visit | Attending: Emergency Medicine | Admitting: Emergency Medicine

## 2016-07-25 ENCOUNTER — Encounter (HOSPITAL_COMMUNITY): Payer: Self-pay | Admitting: Certified Registered Nurse Anesthetist

## 2016-07-25 ENCOUNTER — Encounter (HOSPITAL_COMMUNITY)
Admission: RE | Disposition: A | Discharge status: Home / Self Care | Payer: Self-pay | Source: Ambulatory Visit | Attending: Emergency Medicine

## 2016-07-25 DIAGNOSIS — E785 Hyperlipidemia, unspecified: Secondary | ICD-10-CM | POA: Insufficient documentation

## 2016-07-25 DIAGNOSIS — I10 Essential (primary) hypertension: Secondary | ICD-10-CM | POA: Insufficient documentation

## 2016-07-25 DIAGNOSIS — F419 Anxiety disorder, unspecified: Secondary | ICD-10-CM | POA: Diagnosis not present

## 2016-07-25 DIAGNOSIS — F17210 Nicotine dependence, cigarettes, uncomplicated: Secondary | ICD-10-CM | POA: Insufficient documentation

## 2016-07-25 DIAGNOSIS — E039 Hypothyroidism, unspecified: Secondary | ICD-10-CM | POA: Diagnosis not present

## 2016-07-25 DIAGNOSIS — Z8249 Family history of ischemic heart disease and other diseases of the circulatory system: Secondary | ICD-10-CM | POA: Diagnosis not present

## 2016-07-25 DIAGNOSIS — Z8261 Family history of arthritis: Secondary | ICD-10-CM | POA: Insufficient documentation

## 2016-07-25 DIAGNOSIS — Z923 Personal history of irradiation: Secondary | ICD-10-CM | POA: Insufficient documentation

## 2016-07-25 DIAGNOSIS — N183 Chronic kidney disease, stage 3 (moderate): Secondary | ICD-10-CM | POA: Diagnosis not present

## 2016-07-25 DIAGNOSIS — R590 Localized enlarged lymph nodes: Secondary | ICD-10-CM | POA: Diagnosis not present

## 2016-07-25 DIAGNOSIS — I129 Hypertensive chronic kidney disease with stage 1 through stage 4 chronic kidney disease, or unspecified chronic kidney disease: Secondary | ICD-10-CM | POA: Diagnosis not present

## 2016-07-25 DIAGNOSIS — Z8589 Personal history of malignant neoplasm of other organs and systems: Secondary | ICD-10-CM | POA: Diagnosis not present

## 2016-07-25 DIAGNOSIS — Z9889 Other specified postprocedural states: Secondary | ICD-10-CM

## 2016-07-25 DIAGNOSIS — Z9221 Personal history of antineoplastic chemotherapy: Secondary | ICD-10-CM | POA: Insufficient documentation

## 2016-07-25 DIAGNOSIS — R918 Other nonspecific abnormal finding of lung field: Secondary | ICD-10-CM

## 2016-07-25 DIAGNOSIS — M479 Spondylosis, unspecified: Secondary | ICD-10-CM | POA: Diagnosis not present

## 2016-07-25 DIAGNOSIS — C3412 Malignant neoplasm of upper lobe, left bronchus or lung: Secondary | ICD-10-CM | POA: Diagnosis not present

## 2016-07-25 DIAGNOSIS — Z419 Encounter for procedure for purposes other than remedying health state, unspecified: Secondary | ICD-10-CM

## 2016-07-25 DIAGNOSIS — Z833 Family history of diabetes mellitus: Secondary | ICD-10-CM | POA: Insufficient documentation

## 2016-07-25 HISTORY — PX: VIDEO BRONCHOSCOPY WITH ENDOBRONCHIAL ULTRASOUND: SHX6177

## 2016-07-25 HISTORY — PX: VIDEO BRONCHOSCOPY WITH ENDOBRONCHIAL NAVIGATION: SHX6175

## 2016-07-25 HISTORY — PX: FUDUCIAL PLACEMENT: SHX5083

## 2016-07-25 SURGERY — VIDEO BRONCHOSCOPY WITH ENDOBRONCHIAL NAVIGATION
Anesthesia: General | Site: Chest

## 2016-07-25 MED ORDER — ROCURONIUM BROMIDE 100 MG/10ML IV SOLN
INTRAVENOUS | Status: DC | PRN
  Administered 2016-07-25: 40 mg via INTRAVENOUS
  Administered 2016-07-25: 10 mg via INTRAVENOUS

## 2016-07-25 MED ORDER — FENTANYL CITRATE (PF) 100 MCG/2ML IJ SOLN
INTRAMUSCULAR | Status: DC | PRN
  Administered 2016-07-25 (×2): 25 ug via INTRAVENOUS
  Administered 2016-07-25: 100 ug via INTRAVENOUS
  Administered 2016-07-25: 25 ug via INTRAVENOUS

## 2016-07-25 MED ORDER — FENTANYL CITRATE (PF) 250 MCG/5ML IJ SOLN
INTRAMUSCULAR | Status: AC
  Filled 2016-07-25: qty 5

## 2016-07-25 MED ORDER — MIDAZOLAM HCL 2 MG/2ML IJ SOLN
INTRAMUSCULAR | Status: AC
  Filled 2016-07-25: qty 2

## 2016-07-25 MED ORDER — LIDOCAINE HCL (CARDIAC) 20 MG/ML IV SOLN
INTRAVENOUS | Status: DC | PRN
  Administered 2016-07-25: 100 mg via INTRAVENOUS

## 2016-07-25 MED ORDER — FENTANYL CITRATE (PF) 100 MCG/2ML IJ SOLN: 25.0000 ug | INTRAMUSCULAR | Status: DC | PRN

## 2016-07-25 MED ORDER — SUGAMMADEX SODIUM 200 MG/2ML IV SOLN
INTRAVENOUS | Status: DC | PRN
  Administered 2016-07-25: 100 mg via INTRAVENOUS

## 2016-07-25 MED ORDER — PROPOFOL 10 MG/ML IV BOLUS
INTRAVENOUS | Status: AC
  Filled 2016-07-25: qty 20

## 2016-07-25 MED ORDER — GLYCOPYRROLATE 0.2 MG/ML IJ SOLN
INTRAMUSCULAR | Status: DC | PRN
  Administered 2016-07-25 (×2): 0.2 mg via INTRAVENOUS

## 2016-07-25 MED ORDER — PROPOFOL 10 MG/ML IV BOLUS
INTRAVENOUS | Status: DC | PRN
  Administered 2016-07-25: 160 mg via INTRAVENOUS

## 2016-07-25 MED ORDER — MEPERIDINE HCL 25 MG/ML IJ SOLN: 6.2500 mg | INTRAMUSCULAR | Status: DC | PRN

## 2016-07-25 MED ORDER — EPHEDRINE SULFATE 50 MG/ML IJ SOLN
INTRAMUSCULAR | Status: DC | PRN
  Administered 2016-07-25 (×2): 5 mg via INTRAVENOUS

## 2016-07-25 MED ORDER — LACTATED RINGERS IV SOLN
INTRAVENOUS | Status: DC | PRN
  Administered 2016-07-25 (×2): via INTRAVENOUS

## 2016-07-25 MED ORDER — METOCLOPRAMIDE HCL 5 MG/ML IJ SOLN: 10.0000 mg | Freq: Once | INTRAMUSCULAR | Status: DC | PRN

## 2016-07-25 MED ORDER — ONDANSETRON HCL 4 MG/2ML IJ SOLN
INTRAMUSCULAR | Status: DC | PRN
  Administered 2016-07-25: 4 mg via INTRAVENOUS

## 2016-07-25 MED ORDER — PHENYLEPHRINE HCL 10 MG/ML IJ SOLN
INTRAVENOUS | Status: DC | PRN
  Administered 2016-07-25: 25 ug/min via INTRAVENOUS

## 2016-07-25 MED ORDER — PHENYLEPHRINE HCL 10 MG/ML IJ SOLN
INTRAMUSCULAR | Status: DC | PRN
  Administered 2016-07-25 (×2): 80 ug via INTRAVENOUS

## 2016-07-25 SURGICAL SUPPLY — 47 items
ADAPTER BRONCH F/PENTAX (ADAPTER) ×4 IMPLANT
BAG SNAP BAND KOVER 36X36 (MISCELLANEOUS) IMPLANT
BRUSH CYTOL CELLEBRITY 1.5X140 (MISCELLANEOUS) ×4 IMPLANT
BRUSH SUPERTRAX BIOPSY (INSTRUMENTS) IMPLANT
BRUSH SUPERTRAX NDL-TIP CYTO (INSTRUMENTS) ×4 IMPLANT
CANISTER SUCT 3000ML PPV (MISCELLANEOUS) ×4 IMPLANT
CHANNEL WORK EXTEND EDGE 180 (KITS) IMPLANT
CHANNEL WORK EXTEND EDGE 45 (KITS) IMPLANT
CHANNEL WORK EXTEND EDGE 90 (KITS) IMPLANT
CONT SPEC 4OZ CLIKSEAL STRL BL (MISCELLANEOUS) ×4 IMPLANT
COVER BACK TABLE 60X90IN (DRAPES) ×4 IMPLANT
COVER DOME SNAP 22 D (MISCELLANEOUS) ×4 IMPLANT
FILTER STRAW FLUID ASPIR (MISCELLANEOUS) IMPLANT
FORCEPS BIOP RJ4 1.8 (CUTTING FORCEPS) ×4 IMPLANT
FORCEPS BIOP SUPERTRX PREMAR (INSTRUMENTS) ×4 IMPLANT
GAUZE SPONGE 4X4 12PLY STRL (GAUZE/BANDAGES/DRESSINGS) ×4 IMPLANT
GLOVE BIO SURGEON STRL SZ7.5 (GLOVE) ×8 IMPLANT
GOWN STRL REUS W/ TWL LRG LVL3 (GOWN DISPOSABLE) ×4 IMPLANT
GOWN STRL REUS W/TWL LRG LVL3 (GOWN DISPOSABLE) ×4
KIT CLEAN ENDO COMPLIANCE (KITS) ×8 IMPLANT
KIT LOCATABLE GUIDE (CANNULA) IMPLANT
KIT MARKER FIDUCIAL DELIVERY (KITS) IMPLANT
KIT PROCEDURE EDGE 180 (KITS) ×4 IMPLANT
KIT PROCEDURE EDGE 45 (KITS) IMPLANT
KIT PROCEDURE EDGE 90 (KITS) IMPLANT
KIT ROOM TURNOVER OR (KITS) ×4 IMPLANT
MARKER FIDUCIAL SL NIT COIL (Implant Marker) ×12 IMPLANT
MARKER SKIN DUAL TIP RULER LAB (MISCELLANEOUS) ×4 IMPLANT
NEEDLE EBUS SONO TIP PENTAX (NEEDLE) IMPLANT
NEEDLE ECHOTIP HI DEF 22GA (NEEDLE) IMPLANT
NEEDLE SUPERTRX PREMARK BIOPSY (NEEDLE) ×4 IMPLANT
NEEDLE WANG 19GA 15MM 130CM (NEEDLE) ×4 IMPLANT
NS IRRIG 1000ML POUR BTL (IV SOLUTION) ×4 IMPLANT
OIL SILICONE PENTAX (PARTS (SERVICE/REPAIRS)) ×4 IMPLANT
PAD ARMBOARD 7.5X6 YLW CONV (MISCELLANEOUS) ×8 IMPLANT
PATCHES PATIENT (LABEL) ×4 IMPLANT
SYR 20CC LL (SYRINGE) ×8 IMPLANT
SYR 20ML ECCENTRIC (SYRINGE) ×8 IMPLANT
SYR 50ML LL SCALE MARK (SYRINGE) IMPLANT
SYR 50ML SLIP (SYRINGE) IMPLANT
SYR 5ML LUER SLIP (SYRINGE) ×4 IMPLANT
TOWEL OR 17X24 6PK STRL BLUE (TOWEL DISPOSABLE) ×4 IMPLANT
TRAP SPECIMEN MUCOUS 40CC (MISCELLANEOUS) IMPLANT
TUBE CONNECTING 20'X1/4 (TUBING) ×2
TUBE CONNECTING 20X1/4 (TUBING) ×6 IMPLANT
UNDERPAD 30X30 (UNDERPADS AND DIAPERS) ×4 IMPLANT
WATER STERILE IRR 1000ML POUR (IV SOLUTION) ×4 IMPLANT

## 2016-07-25 NOTE — Op Note (Signed)
Video Bronchoscopy with Electromagnetic Navigation and Endobronchial Ultrasound Procedure Note  Date of Operation: 07/25/2016  Pre-op Diagnosis: Left upper lobe nodule, left perihilar mass  Post-op Diagnosis: Next  Surgeon: Baltazar Apo  Assistants: None  Anesthesia: General endotracheal anesthesia  Operation: Flexible video fiberoptic bronchoscopy with electromagnetic navigation and biopsies.  Estimated Blood Loss: Minimal  Complications: None apparent  Indications and History: Kevin Ho is a 83 y.o. male with history of tobacco use. He was found to have a left upper lobe spiculated nodule as well as some left sided hilar adenopathy versus mass. His were hypermetabolic on PET scan. Recommendation was made to achieve tissue diagnosis via navigational bronchoscopy and endobronchial ultrasound.  The risks, benefits, complications, treatment options and expected outcomes were discussed with the patient.  The possibilities of pneumothorax, pneumonia, reaction to medication, pulmonary aspiration, perforation of a viscus, bleeding, failure to diagnose a condition and creating a complication requiring transfusion or operation were discussed with the patient who freely signed the consent.    Description of Procedure: The patient was seen in the Preoperative Area, was examined and was deemed appropriate to proceed.  The patient was taken to OR 10, identified as Kevin Ho and the procedure verified as Flexible Video Fiberoptic Bronchoscopy.  A Time Out was held and the above information confirmed.   Prior to the date of the procedure a high-resolution CT scan of the chest was performed. Utilizing [place] a virtual tracheobronchial tree was generated to allow the creation of distinct navigation pathways to the patient's parenchymal abnormalities. After being taken to the operating room general anesthesia was initiated and the patient  was orally intubated. The video  fiberoptic bronchoscope was introduced via the endotracheal tube and a general inspection was performed which showed some thickening and irregularity in the left upper lobe. No clear overt endobronchial lesion. Also Noted was an area of raised mucosa in the right middle lobe airway without any erythema. The extendable working channel and locator guide were introduced into the bronchoscope. The distinct navigation pathways prepared prior to this procedure were then utilized to navigate to within 0.5-1.2 cm of patient's lesion identified on CT scan. The extendable working channel was secured into place and the locator guide was withdrawn. Under fluoroscopic guidance transbronchial needle brushings, transbronchial Wang needle biopsies, and transbronchial forceps biopsies were performed to be sent for cytology and pathology. A bronchioalveolar lavage was performed in the left upper lobe and sent for cytology and microbiology (bacterial, fungal, AFB smears and cultures). EBUS inspection of the left upper lobe airways revealed enlargement in the area of 11 L. Attempts to pass a needle through the EBUS in order to achieve aspiration biopsies were unsuccessful due to the hyperflexed scope position required to achieve good visualization. The EBUS scope was withdrawn and standard Wang needle biopsies were performed via the standard scope in this location. Finally 3 fiducial markers were placed adjacent to the left upper lobe nodule to facilitate radiation therapy should this become indicated. At the end of the procedure a general airway inspection was performed and there was no evidence of active bleeding. The bronchoscope was removed.  The patient tolerated the procedure well. There was no significant blood loss and there were no obvious complications. A post-procedural chest x-ray is pending.  Samples: 1. Transbronchial needle brushings from LUL nodule 2. Transbronchial Wang needle biopsies from LUL nodule 3.  Transbronchial forceps biopsies from LUL nodule 4. Bronchoalveolar lavage from LUL 5. Endobronchial biopsies from LUL bronchus 6.  Endobronchial brushings from LUL bronchus 7. Wang needle aspiration biopsies from LUL bronchus  Plans:  The patient will be discharged from the PACU to home when recovered from anesthesia and after chest x-ray is reviewed. We will review the cytology, pathology and microbiology results with the patient when they become available. Outpatient followup will be with Dr Lake Bells.    Baltazar Apo, MD, PhD 07/25/2016, 11:07 AM [facility] [phone] or if no answer [phone]

## 2016-07-25 NOTE — Anesthesia Postprocedure Evaluation (Signed)
Anesthesia Post Note  Patient: Kevin Ho  Procedure(s) Performed: Procedure(s) (LRB): VIDEO BRONCHOSCOPY WITH ENDOBRONCHIAL NAVIGATION (N/A) VIDEO BRONCHOSCOPY WITH ENDOBRONCHIAL ULTRASOUND (N/A) PLACEMENT OF FUDUCIAL (Left)     Patient location during evaluation: PACU Anesthesia Type: General Level of consciousness: awake and alert Pain management: pain level controlled Vital Signs Assessment: post-procedure vital signs reviewed and stable Respiratory status: spontaneous breathing, nonlabored ventilation, respiratory function stable and patient connected to nasal cannula oxygen Cardiovascular status: blood pressure returned to baseline and stable Postop Assessment: no signs of nausea or vomiting Anesthetic complications: no    Last Vitals:  Vitals:   07/25/16 1130 07/25/16 1140  BP: 115/60 132/69  Pulse: 66 65  Resp: 17   Temp:      Last Pain:  Vitals:   07/25/16 1140  TempSrc:   PainSc: 0-No pain                 Montez Hageman

## 2016-07-25 NOTE — Anesthesia Preprocedure Evaluation (Signed)
Anesthesia Evaluation  Patient identified by MRN, date of birth, ID band Patient awake    Reviewed: Allergy & Precautions, NPO status , Patient's Chart, lab work & pertinent test results  Airway Mallampati: II  TM Distance: >3 FB Neck ROM: Full    Dental no notable dental hx. (+) Edentulous Upper, Edentulous Lower   Pulmonary COPD, Current Smoker,  Lung mass   Pulmonary exam normal breath sounds clear to auscultation       Cardiovascular negative cardio ROS Normal cardiovascular exam Rhythm:Regular Rate:Normal     Neuro/Psych negative neurological ROS  negative psych ROS   GI/Hepatic negative GI ROS, (+)     substance abuse  alcohol use,   Endo/Other  negative endocrine ROSHypothyroidism   Renal/GU negative Renal ROS  negative genitourinary   Musculoskeletal negative musculoskeletal ROS (+)   Abdominal   Peds negative pediatric ROS (+)  Hematology negative hematology ROS (+)   Anesthesia Other Findings   Reproductive/Obstetrics negative OB ROS                             Anesthesia Physical Anesthesia Plan  ASA: III  Anesthesia Plan: General   Post-op Pain Management:    Induction: Intravenous  PONV Risk Score and Plan:   Airway Management Planned: Oral ETT  Additional Equipment:   Intra-op Plan:   Post-operative Plan: Extubation in OR  Informed Consent: I have reviewed the patients History and Physical, chart, labs and discussed the procedure including the risks, benefits and alternatives for the proposed anesthesia with the patient or authorized representative who has indicated his/her understanding and acceptance.   Dental advisory given  Plan Discussed with: CRNA  Anesthesia Plan Comments:         Anesthesia Quick Evaluation

## 2016-07-25 NOTE — H&P (View-Only) (Signed)
Subjective:    Patient ID: Kevin Ho, male    DOB: Jun 26, 1931, 83 y.o.   MRN: [id number]  HPI Chief Complaint  Patient presents with  . Advice Only    Referred by Dr. Talbert Cage for a biopsy of LUL mass.     [name] is here to see me because of an abnormal CXR.  He had the CXR as a matter of a routine physical and he didn't have any symptoms at the time.  They had been following the nodule for about a year or so.  He has not lost much weight.    His "throat cancer" was treated with XRT and chemotherapy.  Sounds as if he may of had head and neck cancer but oncology notes refer to esophageal cancer.  He lives independently but on family property with aching keep a close eye on him. He has dementia. He has smoked one to one and a half packs of cigarettes daily for 73 years and continues to smoke. His family states that he becomes very irritable and difficult to manage when he tries to stop smoking. Is able to do some basic activities on his own, he requires supervision and has really no knowledge of why see her today.  He is very adamant that he does not want to have surgery of any kind but they are willing to consider an endoscopy based diagnostic approach.    Past Medical History:  Diagnosis Date  . Alcoholism ([place])   . Anxiety   . Arthritis    neck and lumbar spine per pt  . Cancer ([place])    In his neck area  . Chronic pain   . Glaucoma    per pt  . Hyperlipidemia   . Hypertension   . Insomnia   . Varicose veins      Family History  Problem Relation Age of Onset  . Diabetes Son   . Hypertension Mother   . Hypertension Father   . Rheum arthritis Daughter   . Lupus Grandchild   . Colon cancer Neg Hx      Social History   Social History  . Marital status: Widowed    Spouse name: N/A  . Number of children: 3  . Years of education: N/A   Occupational History  . retired    Social History Main Topics  . Smoking status: Current Every Day Smoker    Packs/day: 1.50      Years: 72.00    Types: Cigarettes    Start date: 01/16/1943  . Smokeless tobacco: Never Used     Comment: -started age 8  . Alcohol use No  . Drug use: No  . Sexual activity: No   Other Topics Concern  . Not on file   Social History Narrative  . No narrative on file     Allergies  Allergen Reactions  . Chantix [Varenicline] Other (See Comments)    Sleep walking      Outpatient Medications Prior to Visit  Medication Sig Dispense Refill  . ondansetron (ZOFRAN) 8 MG tablet Take 1 tablet (8 mg total) by mouth every 8 (eight) hours as needed for nausea or vomiting. 40 tablet 0  . donepezil (ARICEPT) 5 MG tablet TAKE ONE TABLET BY MOUTH ONCE DAILY AT BEDTIME (Patient not taking: Reported on 07/05/2016) 30 tablet 0  . levothyroxine (SYNTHROID, LEVOTHROID) 75 MCG tablet Take 1 tablet (75 mcg total) by mouth daily before breakfast. (Patient not taking: Reported on 07/05/2016) 90 tablet  1   No facility-administered medications prior to visit.       Review of Systems  Constitutional: Negative for fever and unexpected weight change.  HENT: Positive for congestion. Negative for dental problem, ear pain, nosebleeds, postnasal drip, rhinorrhea, sinus pressure, sneezing, sore throat and trouble swallowing.   Eyes: Negative for redness and itching.  Respiratory: Positive for cough, chest tightness and shortness of breath. Negative for wheezing.   Cardiovascular: Negative for palpitations and leg swelling.  Gastrointestinal: Negative for nausea and vomiting.  Genitourinary: Negative for dysuria.  Musculoskeletal: Negative for joint swelling.  Skin: Negative for rash.  Neurological: Negative for headaches.  Hematological: Does not bruise/bleed easily.  Psychiatric/Behavioral: Negative for dysphoric mood. The patient is not nervous/anxious.        Objective:   Physical Exam Vitals:   07/05/16 1006  BP: 120/72  Pulse: 67  SpO2: 100%  Weight: 131 lb 6.4 oz (59.6 kg)  Height: 5\' 4"   (1.626 m)   RA  Gen: thin, chronically ill appearing, no acute distress HENT: NCAT, OP clear, neck supple without masses Eyes: PERRL, EOMi Lymph: no cervical lymphadenopathy PULM: CTA B CV: RRR, no mgr, no JVD GI: BS+, soft, nontender, no hsm Derm: no rash or skin breakdown MSK: normal bulk and tone Neuro: wake and alert but not oriented to situation, CN II-XII intact, strength 5/5 in all 4 extremities Psyche: normal mood and affect  CBC    Component Value Date/Time   WBC 9.4 05/25/2016 1251   WBC 10.0 03/15/2014 1052   WBC 8.1 04/27/2013 1129   RBC 4.79 05/25/2016 1251   RBC 4.72 03/15/2014 1052   RBC 4.03 (L) 04/27/2013 1129   HGB 15.8 05/25/2016 1251   HCT 46.0 05/25/2016 1251   PLT 199 05/25/2016 1251   MCV 96 05/25/2016 1251   MCH 33.0 05/25/2016 1251   MCH 30.9 03/15/2014 1052   MCH 31.5 04/27/2013 1129   MCHC 34.3 05/25/2016 1251   MCHC 32.3 03/15/2014 1052   MCHC 34.4 04/27/2013 1129   RDW 13.2 05/25/2016 1251   LYMPHSABS 1.5 05/25/2016 1251   MONOABS 1.0 04/27/2013 1129   EOSABS 0.6 (H) 05/25/2016 1251   BASOSABS 0.1 05/25/2016 1251      Records from his visit in June 2018 with oncology reviewed. He has a history of esophageal cancer and did "terribly" with chemotherapy. He's been found to have a new left upper lobe mass with mediastinal lymphadenopathy.    Assessment & Plan:  Mass of left lung He has a left upper lobe nodule with level I5 lymphadenopathy and level 10 or 11 lymphadenopathy. It's not clear to me based on my review of the images that the suprahilar lymph nodes will be immediately accessible via endobronchial ultrasound-guided needle aspiration. However, we may be able to reach the pulmonary nodule with navigational bronchoscopy techniques.  While he is not in great health, he can walk on level ground without stopping "all day" according to his family so I believe his anesthesia risk would be low.  He is willing to consider non-chemotherapy  based cancer treatment regimens.  Plan: I'm going to discuss diagnosis options with navigational bronchoscopy with my partner who performs that procedure I will call the patient next week after we've had a chance to discuss the images together.    Current Outpatient Prescriptions:  Marland Kitchen  Melatonin 1 MG CAPS, Take 2 capsules by mouth at bedtime., Disp: , Rfl:  .  ondansetron (ZOFRAN) 8 MG tablet, Take 1 tablet (  8 mg total) by mouth every 8 (eight) hours as needed for nausea or vomiting., Disp: 40 tablet, Rfl: 0 .  donepezil (ARICEPT) 5 MG tablet, TAKE ONE TABLET BY MOUTH ONCE DAILY AT BEDTIME (Patient not taking: Reported on 07/05/2016), Disp: 30 tablet, Rfl: 0 .  levothyroxine (SYNTHROID, LEVOTHROID) 75 MCG tablet, Take 1 tablet (75 mcg total) by mouth daily before breakfast. (Patient not taking: Reported on 07/05/2016), Disp: 90 tablet, Rfl: 1

## 2016-07-25 NOTE — Interval H&P Note (Signed)
PCCM Interval Note  Mr Bocock is 7 active smoker with a hx of "throat CA" treated over 10 yrs ago with chemoradiation. Notes indicate that this was esophageal CA. He was found to have a LUL nodule 05/25/16 CXR, prompted a CT and PET that confirm a 11x75mm LUL spiculated nodule with L5, L10-11 nodal enlargement. Hypermetabolic on PET and concerning for primary lung CA. He presents now for bx via EBUS and ENB. He continues to smoke. tells me that he has felt well except nervous about the test today. No new issues, cough, CP, dyspnea. His daughter in law is here w him today. No new meds since seen by Dr [place] Bells.   Vitals:   07/25/16 0716 07/25/16 0719  BP:  (!) 175/80  Pulse:  67  Resp:  16  Temp:  (!) 97.4 F (36.3 C)  TempSrc:  Oral  SpO2:  99%  Weight: 59 kg (130 lb)   Height: 5\' 5"  (1.651 m)    Gen: Pleasant, thin man, in no distress,  normal affect  ENT: No lesions,  mouth clear,  oropharynx clear, no postnasal drip, slight hoarse voice  Neck: No JVD, no stridor  Lungs: No use of accessory muscles, clear bilaterally  Cardiovascular: RRR, heart sounds normal, no murmur or gallops, no peripheral edema  Abdomen: soft and NT, no HSM,  BS normal  Musculoskeletal: No deformities, no cyanosis or clubbing  Neuro: alert, non focal  Skin: Warm, no lesions or rashes    Recent Labs Lab 07/20/16 1351  HGB 15.3  HCT 46.3  WBC 7.3  PLT 192    Recent Labs Lab 07/20/16 1351  INR 1.02   Plan:  Will proceed with EBUS to inspect L10-11, hopefully bx. If no revealing cytology on the EBUS will attempt navigation to the LUL nodule to sample this area as well.   Baltazar Apo, MD, PhD 07/25/2016, 8:23 AM [facility] [phone] or if no answer [phone]

## 2016-07-25 NOTE — Transfer of Care (Signed)
Immediate Anesthesia Transfer of Care Note  Patient: Kevin Ho  Procedure(s) Performed: Procedure(s): VIDEO BRONCHOSCOPY WITH ENDOBRONCHIAL NAVIGATION (N/A) VIDEO BRONCHOSCOPY WITH ENDOBRONCHIAL ULTRASOUND (N/A) PLACEMENT OF FUDUCIAL (Left)  Patient Location: PACU  Anesthesia Type:General  Level of Consciousness: awake, alert , oriented and patient cooperative  Airway & Oxygen Therapy: Patient Spontanous Breathing and Patient connected to face mask oxygen  Post-op Assessment: Report given to RN and Post -op Vital signs reviewed and stable  Post vital signs: Reviewed and stable  Last Vitals:  Vitals:   07/25/16 0719 07/25/16 1116  BP: (!) 175/80   Pulse: 67   Resp: 16   Temp: (!) 36.3 C (!) (P) 36.3 C    Last Pain:  Vitals:   07/25/16 1116  TempSrc:   PainSc: (P) 0-No pain      Patients Stated Pain Goal: 5 (46/28/63 8177)  Complications: No apparent anesthesia complications

## 2016-07-25 NOTE — Anesthesia Procedure Notes (Signed)
Procedure Name: Intubation Date/Time: 07/25/2016 8:40 AM Performed by: Shirlyn Goltz Pre-anesthesia Checklist: Patient identified, Emergency Drugs available, Suction available and Patient being monitored Patient Re-evaluated:Patient Re-evaluated prior to inductionOxygen Delivery Method: Circle system utilized Preoxygenation: Pre-oxygenation with 100% oxygen Intubation Type: IV induction Ventilation: Mask ventilation with difficulty and Oral airway inserted - appropriate to patient size Laryngoscope Size: Mac and 4 Grade View: Grade I Tube type: Oral Tube size: 9.0 mm Number of attempts: 1 Airway Equipment and Method: Stylet Placement Confirmation: ETT inserted through vocal cords under direct vision,  positive ETCO2 and breath sounds checked- equal and bilateral Secured at: 22 cm Tube secured with: Tape Dental Injury: Teeth and Oropharynx as per pre-operative assessment

## 2016-07-25 NOTE — Discharge Instructions (Signed)
Flexible Bronchoscopy, Care After These instructions give you information on caring for yourself after your procedure. Your doctor may also give you more specific instructions. Call your doctor if you have any problems or questions after your procedure. Follow these instructions at home:  Do not eat or drink anything for 2 hours after your procedure. If you try to eat or drink before the medicine wears off, food or drink could go into your lungs. You could also burn yourself.  After 2 hours have passed and when you can cough and gag normally, you may eat soft food and drink liquids slowly.  The day after the test, you may eat your normal diet.  You may do your normal activities.  Keep all doctor visits. Get help right away if:  You get more and more short of breath.  You get light-headed.  You feel like you are going to pass out (faint).  You have chest pain.  You have new problems that worry you.  You cough up more than a little blood.  You cough up more blood than before. This information is not intended to replace advice given to you by your health care provider. Make sure you discuss any questions you have with your health care provider. Document Released: 10/29/2008 Document Revised: 06/09/2015 Document Reviewed: 09/05/2012 Elsevier Interactive Patient Education  2017 [place].  Please call our office for any questions or concerns. [phone]

## 2016-07-26 ENCOUNTER — Encounter (HOSPITAL_COMMUNITY): Payer: Self-pay | Admitting: Emergency Medicine

## 2016-07-26 ENCOUNTER — Other Ambulatory Visit (HOSPITAL_COMMUNITY): Payer: Self-pay | Admitting: Oncology

## 2016-07-26 ENCOUNTER — Ambulatory Visit (HOSPITAL_COMMUNITY): Payer: Medicare HMO

## 2016-07-26 LAB — ACID FAST SMEAR (AFB): ACID FAST SMEAR - AFSCU2: NEGATIVE

## 2016-07-26 LAB — ACID FAST SMEAR (AFB, MYCOBACTERIA)

## 2016-07-27 LAB — CULTURE, RESPIRATORY W GRAM STAIN

## 2016-07-27 LAB — CULTURE, RESPIRATORY: CULTURE: NO GROWTH

## 2016-07-30 ENCOUNTER — Telehealth: Payer: Self-pay | Admitting: Emergency Medicine

## 2016-07-30 LAB — ANAEROBIC CULTURE

## 2016-07-30 NOTE — Telephone Encounter (Signed)
I reviewed the biopsy results with pt's daughter-in-law [name]. Explained that this is [place], likely adenoCA. He has f/u established at [facility] for Friday. She will forward the info to Mr Geathers and get him to the Friday appointment.

## 2016-08-01 NOTE — Telephone Encounter (Signed)
thanks

## 2016-08-03 ENCOUNTER — Ambulatory Visit (HOSPITAL_COMMUNITY): Payer: Medicare HMO

## 2016-08-17 ENCOUNTER — Ambulatory Visit (HOSPITAL_COMMUNITY): Payer: Medicare HMO

## 2016-08-17 ENCOUNTER — Encounter (HOSPITAL_COMMUNITY): Payer: Medicare HMO

## 2016-08-20 ENCOUNTER — Encounter (HOSPITAL_COMMUNITY): Payer: Self-pay

## 2016-08-20 ENCOUNTER — Ambulatory Visit (HOSPITAL_COMMUNITY): Payer: Medicare HMO

## 2016-08-20 ENCOUNTER — Encounter (HOSPITAL_COMMUNITY): Payer: Medicare HMO | Attending: Hematology | Admitting: Oncology

## 2016-08-20 VITALS — BP 130/65 | HR 65 | Resp 18 | Ht 65.5 in | Wt 126.4 lb

## 2016-08-20 DIAGNOSIS — R590 Localized enlarged lymph nodes: Secondary | ICD-10-CM

## 2016-08-20 DIAGNOSIS — R918 Other nonspecific abnormal finding of lung field: Secondary | ICD-10-CM

## 2016-08-20 DIAGNOSIS — Z8501 Personal history of malignant neoplasm of esophagus: Secondary | ICD-10-CM | POA: Diagnosis not present

## 2016-08-20 NOTE — Progress Notes (Signed)
[facility] Follow up:    Kevin Fraise, MD [place] [address]    SUMMARY OF ONCOLOGIC HISTORY: LUL lung mass   INTERVAL HISTORY: Kevin Ho 83 y.o. male returns for continued follow-up of his left upper lobe lung mass.  CT chest w/ contrast on 06/01/16 demonstrated a left upper lobe nodule measuring 11 mm is suspicious for malignancy.  His previous CT chest without contrast in February 2010 demonstrated that the left upper lobe mass measured 2-3 mm. PET on 06/29/16: 1. Hypermetabolic spiculated left upper lobe pulmonary nodule is most consistent with primary bronchogenic carcinoma. 2. Prevascular left suprahilar nodal metastasis. Presuming non-small-cell histology, T1aN2M0 or stage IIIA. MRI brain w/ and w/o contrast 07/03/16: negative for metastatic disease.  Patient presents for follow up with his son and daughter in law. Patient underwent video bronchoscopy with endobronchial navigation, endobronchial ultrasound on 07/25/16 and had biopsies taken of the left upper lobe nodule, and surgical path from both specimens that were sent to pathology was negative for malignancy.Bronchial lavage washings cytology was negative for malignant cells. Patient went to get evaluated for repeat dilation of his esophagus and was told that he's not a candidate given his advanced age over 76. He continues to have dysphagia with solid foods and have been able to eat soft foods. Denies any shortness of breath, chest pain, abdominal pain, N/V/D.   Patient Active Problem List   Diagnosis Date Noted  . Mass of left lung 07/02/2016  . Essential hypertension 04/11/2015  . Generalized anxiety disorder 12/13/2014  . Insomnia, idiopathic 12/13/2014  . Squamous cell carcinoma of skin 12/08/2014  . Dysphagia 07/23/2013  . Occult blood in stools 07/23/2013  . Normocytic anemia 07/23/2013  . CKD (chronic kidney disease), stage III 04/24/2013  . Varicose veins   . Arthritis    . Hypercalcemia 03/13/2013  . Tobacco user 03/13/2013  . Hypothyroidism 01/27/2013  . Hyperlipidemia 06/20/2012    is allergic to chantix [varenicline].  MEDICAL HISTORY: Past Medical History:  Diagnosis Date  . Alcoholism ([place])   . Anxiety   . Arthritis    neck and lumbar spine per pt  . Cancer ([place])    In his neck area  . Chronic pain   . Dementia   . Glaucoma    per pt  . Headache   . Hyperlipidemia   . Hypertension   . Hypothyroidism   . Insomnia   . Varicose veins     SURGICAL HISTORY: Past Surgical History:  Procedure Laterality Date  . EYE SURGERY Bilateral    cataracts  . FUDUCIAL PLACEMENT Left 07/25/2016   Procedure: PLACEMENT OF FUDUCIAL;  Surgeon: Collene Gobble, MD;  Location: [facility];  Service: Thoracic;  Laterality: Left;  . porta cath     removed in 2012  . right inguinal hernia    . VIDEO BRONCHOSCOPY WITH ENDOBRONCHIAL NAVIGATION N/A 07/25/2016   Procedure: VIDEO BRONCHOSCOPY WITH ENDOBRONCHIAL NAVIGATION;  Surgeon: Collene Gobble, MD;  Location: [facility];  Service: Thoracic;  Laterality: N/A;  . VIDEO BRONCHOSCOPY WITH ENDOBRONCHIAL ULTRASOUND N/A 07/25/2016   Procedure: VIDEO BRONCHOSCOPY WITH ENDOBRONCHIAL ULTRASOUND;  Surgeon: Collene Gobble, MD;  Location: [facility];  Service: Thoracic;  Laterality: N/A;    SOCIAL HISTORY: Social History   Social History  . Marital status: Single    Spouse name: N/A  . Number of children: 3  . Years of education: N/A   Occupational History  . retired  Social History Main Topics  . Smoking status: Current Every Day Smoker    Packs/day: 1.50    Years: 72.00    Types: Cigarettes    Start date: 01/16/1943  . Smokeless tobacco: Never Used     Comment: -started age 26  . Alcohol use No  . Drug use: No  . Sexual activity: No   Other Topics Concern  . Not on file   Social History Narrative  . No narrative on file    FAMILY HISTORY: Family History  Problem Relation Age of Onset  . Diabetes Son   .  Hypertension Mother   . Hypertension Father   . Rheum arthritis Daughter   . Lupus Grandchild   . Colon cancer Neg Hx     Review of Systems  Constitutional: Negative for appetite change, chills, fatigue and fever.  HENT:   Negative for hearing loss, lump/mass, mouth sores, sore throat and tinnitus.   Eyes: Negative for eye problems and icterus.  Respiratory: Negative for chest tightness, cough, hemoptysis, shortness of breath and wheezing.   Cardiovascular: Negative for chest pain, leg swelling and palpitations.  Gastrointestinal: Negative for abdominal distention, abdominal pain, blood in stool, diarrhea, nausea and vomiting.       Dysphagia, nausea  Endocrine: Negative.  Negative for hot flashes.  Genitourinary: Negative for difficulty urinating, frequency and hematuria.   Musculoskeletal: Negative for arthralgias and neck pain.  Skin: Negative for itching and rash.  Neurological: Positive for dizziness. Negative for headaches and speech difficulty.  Hematological: Negative for adenopathy. Does not bruise/bleed easily.  Psychiatric/Behavioral: Negative for confusion. The patient is not nervous/anxious.       PHYSICAL EXAMINATION  ECOG PERFORMANCE STATUS: 2 - Symptomatic, <50% confined to bed  Vitals:   08/20/16 1516  BP: 130/65  Pulse: 65  Resp: 18    Physical Exam  Constitutional: He is oriented to person, place, and time. No distress.  Thin elderly man  HENT:  Head: Normocephalic and atraumatic.  Mouth/Throat: No oropharyngeal exudate.  Eyes: Pupils are equal, round, and reactive to light. Conjunctivae are normal. No scleral icterus.  Neck: Normal range of motion. Neck supple. No JVD present.  Cardiovascular: Normal rate, regular rhythm and normal heart sounds.  Exam reveals no gallop and no friction rub.   No murmur heard. Pulmonary/Chest: Breath sounds normal. No respiratory distress. He has no wheezes. He has no rales.  Abdominal: Soft. Bowel sounds are normal.  He exhibits no distension. There is no tenderness. There is no guarding.  Musculoskeletal: He exhibits no edema or tenderness.  Lymphadenopathy:    He has no cervical adenopathy.  Neurological: He is alert and oriented to person, place, and time. No cranial nerve deficit.  Skin: Skin is warm and dry. No rash noted. No erythema. No pallor.  Psychiatric: Affect and judgment normal.    LABORATORY DATA:  CBC    Component Value Date/Time   WBC 7.3 07/20/2016 1351   RBC 4.75 07/20/2016 1351   HGB 15.3 07/20/2016 1351   HGB 15.8 05/25/2016 1251   HCT 46.3 07/20/2016 1351   HCT 46.0 05/25/2016 1251   PLT 192 07/20/2016 1351   PLT 199 05/25/2016 1251   MCV 97.5 07/20/2016 1351   MCV 96 05/25/2016 1251   MCH 32.2 07/20/2016 1351   MCHC 33.0 07/20/2016 1351   RDW 13.3 07/20/2016 1351   RDW 13.2 05/25/2016 1251   LYMPHSABS 1.5 05/25/2016 1251   MONOABS 1.0 04/27/2013 1129   EOSABS 0.6 (  H) 05/25/2016 1251   BASOSABS 0.1 05/25/2016 1251    CMP     Component Value Date/Time   NA 136 07/20/2016 1351   NA 140 06/01/2016 1501   K 4.5 07/20/2016 1351   CL 101 07/20/2016 1351   CO2 24 07/20/2016 1351   GLUCOSE 111 (H) 07/20/2016 1351   BUN 20 07/20/2016 1351   BUN 14 06/01/2016 1501   CREATININE 1.70 (H) 07/20/2016 1351   CREATININE 1.49 (H) 06/23/2012 1639   CALCIUM 9.9 07/20/2016 1351   PROT 7.4 07/20/2016 1351   PROT 7.6 05/25/2016 1251   ALBUMIN 4.3 07/20/2016 1351   ALBUMIN 4.6 05/25/2016 1251   AST 18 07/20/2016 1351   ALT 11 (L) 07/20/2016 1351   ALKPHOS 62 07/20/2016 1351   BILITOT 0.6 07/20/2016 1351   BILITOT 0.4 05/25/2016 1251   GFRNONAA 36 (L) 07/20/2016 1351   GFRNONAA 45 (L) 06/20/2012 1048   GFRAA 41 (L) 07/20/2016 1351   GFRAA 52 (L) 06/20/2012 1048   PATHOLOGY: Diagnosis 1. Lung, biopsy, LUL - BENIGN LUNG PARENCHYMA WITH A SMALL LYMPHOID AGGREGATE. - THERE IS NO EVIDENCE OF MALIGNANCY. 2. Lung, biopsy, LUL - BENIGN LUNG PARENCHYMA. - THERE IS NO  EVIDENCE OF MALIGNANCY. Enid Cutter MD Pathologist, Electronic Signature (Case signed 07/27/2016)  ASSESSMENT and THERAPY PLAN:  Spiculated hypermetabolic LUL lung mass with hypermetabolic LNs suspicious for lung cancer. Video bronchoscopy with endobronchial navigation, endobronchial ultrasound on 07/25/16 and had biopsies taken of the left upper lobe nodule, and surgical path from both specimens that were sent to pathology was negative for malignancy.  PLAN: -Despite pathology report that is negative for malignancy, this could be due to sampling, I still have high suspicion that he likely has bronchogenic carcinoma since it was so hypermetabolically active and is spiculated on scans and has hypermetabolically active lymph nodes on his scans.  -I doubt patient will tolerate chemotherapy well since patient's family states that he did terribly with his previous chemotherapy for his esophageal cancer, as well as his other co-morbidities, and advanced age. Patient and the family are interested in radiation and agree that he would not tolerate chemotherapy. I will make patient a stat referral to rad-onc to see if they can radiate the lung mass as well as the suprahilar lymph node. -RTC in 8 weeks for follow up.   All questions were answered. The patient knows to call the clinic with any problems, questions or concerns. We can certainly see the patient much sooner if necessary.  This note was electronically signed. [name], MD 08/20/2016

## 2016-08-21 ENCOUNTER — Encounter: Payer: Self-pay | Admitting: Radiation Oncology

## 2016-08-23 LAB — FUNGUS CULTURE WITH STAIN

## 2016-08-23 LAB — FUNGUS CULTURE RESULT

## 2016-08-23 LAB — FUNGAL ORGANISM REFLEX

## 2016-08-29 ENCOUNTER — Ambulatory Visit: Payer: Medicare HMO

## 2016-08-29 ENCOUNTER — Ambulatory Visit: Payer: Medicare HMO | Admitting: Radiation Oncology

## 2016-09-06 LAB — ACID FAST CULTURE WITH REFLEXED SENSITIVITIES: ACID FAST CULTURE - AFSCU3: NEGATIVE

## 2016-09-12 ENCOUNTER — Ambulatory Visit
Admission: RE | Admit: 2016-09-12 | Discharge: 2016-09-12 | Disposition: A | Discharge status: Home / Self Care | Payer: Medicare HMO | Source: Ambulatory Visit | Attending: Radiation Oncology | Admitting: Radiation Oncology

## 2016-09-12 ENCOUNTER — Ambulatory Visit: Payer: Medicare HMO

## 2016-09-12 DIAGNOSIS — E785 Hyperlipidemia, unspecified: Secondary | ICD-10-CM | POA: Insufficient documentation

## 2016-09-12 DIAGNOSIS — E039 Hypothyroidism, unspecified: Secondary | ICD-10-CM | POA: Insufficient documentation

## 2016-09-12 DIAGNOSIS — F17210 Nicotine dependence, cigarettes, uncomplicated: Secondary | ICD-10-CM | POA: Insufficient documentation

## 2016-09-12 DIAGNOSIS — Z85850 Personal history of malignant neoplasm of thyroid: Secondary | ICD-10-CM | POA: Insufficient documentation

## 2016-09-12 DIAGNOSIS — G4700 Insomnia, unspecified: Secondary | ICD-10-CM | POA: Insufficient documentation

## 2016-09-12 DIAGNOSIS — R05 Cough: Secondary | ICD-10-CM | POA: Insufficient documentation

## 2016-09-12 DIAGNOSIS — I10 Essential (primary) hypertension: Secondary | ICD-10-CM | POA: Insufficient documentation

## 2016-09-12 DIAGNOSIS — H409 Unspecified glaucoma: Secondary | ICD-10-CM | POA: Insufficient documentation

## 2016-09-12 DIAGNOSIS — G8929 Other chronic pain: Secondary | ICD-10-CM | POA: Insufficient documentation

## 2016-09-12 DIAGNOSIS — F419 Anxiety disorder, unspecified: Secondary | ICD-10-CM | POA: Insufficient documentation

## 2016-09-12 DIAGNOSIS — C3412 Malignant neoplasm of upper lobe, left bronchus or lung: Secondary | ICD-10-CM | POA: Insufficient documentation

## 2016-09-12 DIAGNOSIS — M1990 Unspecified osteoarthritis, unspecified site: Secondary | ICD-10-CM | POA: Insufficient documentation

## 2016-09-12 DIAGNOSIS — Z510 Encounter for antineoplastic radiation therapy: Secondary | ICD-10-CM | POA: Insufficient documentation

## 2016-09-12 DIAGNOSIS — F1021 Alcohol dependence, in remission: Secondary | ICD-10-CM | POA: Insufficient documentation

## 2016-09-12 DIAGNOSIS — F0390 Unspecified dementia without behavioral disturbance: Secondary | ICD-10-CM | POA: Insufficient documentation

## 2016-09-14 ENCOUNTER — Encounter (HOSPITAL_COMMUNITY): Payer: Self-pay

## 2016-09-14 ENCOUNTER — Encounter (HOSPITAL_COMMUNITY): Payer: Medicare HMO

## 2016-09-14 NOTE — Progress Notes (Signed)
Thoracic Location of Tumor / Histology: LUL lung mass and suprahilar lymph node  Patient presented with symptoms of: Patient has a chronic cough due to smoking and had a chest x-ray with his primary care physician on 05/28/2016 which showed a new nodular density in the left upper lobe.  Biopsies revealed:   07/25/16  Diagnosis BRONCHIAL LAVAGE (E) SPECIMEN 5 OF 5, COLLECTED ON 07/25/16: NO MALIGNANT CELLS IDENTIFIED.  Diagnosis BRONCHIAL BRUSHING LEFT UPPER LOBE ENDOBRONCHIAL, (SPECIMEN 4 OF 5, COLLECTED ON 07/25/16): NO MALIGNANT CELLS IDENTIFIED.  Diagnosis FINE NEEDLE ASPIRATION, ENDOSCOPIC EBUS (C) LEFT UPPER LOBE BRONCHUS (SPECIMEN 3 OF 5 COLLECTED 07/25/2016) NO MALIGNANT CELLS IDENTIFIED.  Diagnosis TRANSBRONCHIAL NEEDLE ASPIRATION, NAVIGATION (B) LEFT UPPER LOBE (SPECIMEN 2 OF 5 COLLECTED 07/25/2016) NO MALIGNANT CELLS IDENTIFIED.  Diagnosis TRANSBRONCHIAL NEEDLE ASPIRATION (A) NAVIGATION LEFT UPPER LOBE BRUSHING (SPECIMEN 1 OF 5, COLLECTED ON 07/25/2016): MALIGNANT CELLS PRESENT, CONSISTENT WITH NON-SMALL CELL CARCINOMA. SEE COMMENT.  Diagnosis 1. Lung, biopsy, LUL - BENIGN LUNG PARENCHYMA WITH A SMALL LYMPHOID AGGREGATE. - THERE IS NO EVIDENCE OF MALIGNANCY. 2. Lung, biopsy, LUL - BENIGN LUNG PARENCHYMA. - THERE IS NO EVIDENCE OF MALIGNANCY.  Tobacco/Marijuana/Snuff/ETOH use: current smoker, has smoked 1.5 ppd for 72 years, currently smoking 1 ppd, denies ETOH or marijuana use  Past/Anticipated interventions by cardiothoracic surgery, if any: no  Past/Anticipated interventions by medical oncology, if any: no  Signs/Symptoms  Weight changes, if any: has lost about 20 lbs   Respiratory complaints, if any: yes has a dry, hacking cough  Hemoptysis, if any: no  Pain issues, if any:  yes - neck and lower back  SAFETY ISSUES:  Prior radiation? yes 2005 to his left neck in [place]  Pacemaker/ICD? no   Possible current pregnancy?no  Is the patient  on methotrexate? no  Current Complaints / other details:  Patient has a history of esophageal cancer s/p chemotherapy and radiation.  Patient is here with his son.  BP (!) 159/81 (BP Location: Right Arm, Patient Position: Sitting)   Pulse 61   Temp 98 F (36.7 C) (Oral)   Ht 5' 5.5" (1.664 m)   Wt 131 lb 3.2 oz (59.5 kg)   SpO2 100%   BMI 21.50 kg/m    Wt Readings from Last 3 Encounters:  09/19/16 131 lb 3.2 oz (59.5 kg)  08/20/16 126 lb 6.4 oz (57.3 kg)  07/25/16 130 lb (59 kg)

## 2016-09-14 NOTE — Progress Notes (Signed)
Nutrition  Planning to see patient today following clinic appointment but clinic appointment moved and no show for nutrition appointment.  Zoella Roberti B. Zenia Resides, [place], [place] Registered Dietitian [phone] (pager)

## 2016-09-19 ENCOUNTER — Ambulatory Visit
Admission: RE | Admit: 2016-09-19 | Discharge: 2016-09-19 | Disposition: A | Discharge status: Home / Self Care | Payer: Medicare HMO | Source: Ambulatory Visit | Attending: Radiation Oncology | Admitting: Radiation Oncology

## 2016-09-19 ENCOUNTER — Encounter: Payer: Self-pay | Admitting: Radiation Oncology

## 2016-09-19 DIAGNOSIS — R918 Other nonspecific abnormal finding of lung field: Secondary | ICD-10-CM

## 2016-09-19 DIAGNOSIS — E039 Hypothyroidism, unspecified: Secondary | ICD-10-CM | POA: Diagnosis not present

## 2016-09-19 DIAGNOSIS — F1021 Alcohol dependence, in remission: Secondary | ICD-10-CM | POA: Diagnosis not present

## 2016-09-19 DIAGNOSIS — G8929 Other chronic pain: Secondary | ICD-10-CM | POA: Diagnosis not present

## 2016-09-19 DIAGNOSIS — H409 Unspecified glaucoma: Secondary | ICD-10-CM | POA: Diagnosis not present

## 2016-09-19 DIAGNOSIS — I10 Essential (primary) hypertension: Secondary | ICD-10-CM | POA: Diagnosis not present

## 2016-09-19 DIAGNOSIS — G4700 Insomnia, unspecified: Secondary | ICD-10-CM | POA: Diagnosis not present

## 2016-09-19 DIAGNOSIS — F419 Anxiety disorder, unspecified: Secondary | ICD-10-CM | POA: Diagnosis not present

## 2016-09-19 DIAGNOSIS — C3412 Malignant neoplasm of upper lobe, left bronchus or lung: Secondary | ICD-10-CM | POA: Diagnosis not present

## 2016-09-19 DIAGNOSIS — F17210 Nicotine dependence, cigarettes, uncomplicated: Secondary | ICD-10-CM | POA: Diagnosis not present

## 2016-09-19 DIAGNOSIS — M1990 Unspecified osteoarthritis, unspecified site: Secondary | ICD-10-CM | POA: Diagnosis not present

## 2016-09-19 DIAGNOSIS — E785 Hyperlipidemia, unspecified: Secondary | ICD-10-CM | POA: Diagnosis not present

## 2016-09-19 DIAGNOSIS — R05 Cough: Secondary | ICD-10-CM | POA: Diagnosis not present

## 2016-09-19 DIAGNOSIS — Z923 Personal history of irradiation: Secondary | ICD-10-CM | POA: Diagnosis not present

## 2016-09-19 DIAGNOSIS — Z85850 Personal history of malignant neoplasm of thyroid: Secondary | ICD-10-CM | POA: Diagnosis not present

## 2016-09-19 DIAGNOSIS — F0390 Unspecified dementia without behavioral disturbance: Secondary | ICD-10-CM | POA: Diagnosis not present

## 2016-09-19 DIAGNOSIS — Z510 Encounter for antineoplastic radiation therapy: Secondary | ICD-10-CM | POA: Diagnosis not present

## 2016-09-19 NOTE — Progress Notes (Signed)
Please see the Nurse Progress Note in the MD Initial Consult Encounter for this patient. 
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
## 2016-09-25 ENCOUNTER — Ambulatory Visit
Admission: RE | Admit: 2016-09-25 | Discharge: 2016-09-25 | Disposition: A | Discharge status: Home / Self Care | Payer: Medicare HMO | Source: Ambulatory Visit | Attending: Radiation Oncology | Admitting: Radiation Oncology

## 2016-09-25 DIAGNOSIS — Z510 Encounter for antineoplastic radiation therapy: Secondary | ICD-10-CM | POA: Diagnosis not present

## 2016-09-25 DIAGNOSIS — C3412 Malignant neoplasm of upper lobe, left bronchus or lung: Secondary | ICD-10-CM | POA: Diagnosis not present

## 2016-09-25 DIAGNOSIS — F17210 Nicotine dependence, cigarettes, uncomplicated: Secondary | ICD-10-CM | POA: Diagnosis not present

## 2016-09-25 DIAGNOSIS — F1021 Alcohol dependence, in remission: Secondary | ICD-10-CM | POA: Diagnosis not present

## 2016-09-25 DIAGNOSIS — G8929 Other chronic pain: Secondary | ICD-10-CM | POA: Diagnosis not present

## 2016-09-25 DIAGNOSIS — Z85850 Personal history of malignant neoplasm of thyroid: Secondary | ICD-10-CM | POA: Diagnosis not present

## 2016-09-25 DIAGNOSIS — H409 Unspecified glaucoma: Secondary | ICD-10-CM | POA: Diagnosis not present

## 2016-09-25 DIAGNOSIS — R05 Cough: Secondary | ICD-10-CM | POA: Diagnosis not present

## 2016-09-25 DIAGNOSIS — E039 Hypothyroidism, unspecified: Secondary | ICD-10-CM | POA: Diagnosis not present

## 2016-09-25 NOTE — Progress Notes (Signed)
  Radiation Oncology         [phone] ________________________________  Name: Kevin Ho MRN: [id number]  Date: 09/25/2016  DOB: [birth year]  SIMULATION AND TREATMENT PLANNING NOTE    ICD-10-CM   1. Primary cancer of left upper lobe of lung (HCC) C34.12     Diagnosis :83 year-old with Stage III non-small cell lung cancer, favoring adenocarcinoma (T1a, N2, M0)  NARRATIVE:  The patient was brought to the [facility].  Identity was confirmed.  All relevant records and images related to the planned course of therapy were reviewed.  The patient freely provided informed written consent to proceed with treatment after reviewing the details related to the planned course of therapy. The consent form was witnessed and verified by the simulation staff.  Then, the patient was set-up in a stable reproducible  supine position for radiation therapy.  CT images were obtained.  Surface markings were placed.  The CT images were loaded into the planning software.  Then the target and avoidance structures were contoured.  Treatment planning then occurred.  The radiation prescription was entered and confirmed.  Then, I designed and supervised the construction of a total of 5 medically necessary complex treatment devices.  I have requested : 3D Simulation  I have requested a DVH of the following structures: GTV, PTV, heart, lungs, spinal cord.  I have ordered:dose calc.  PLAN:  The patient will receive 64 Gy in 32 fractions. Patient tolerated chemotherapy poorly with his previous malignancy and therefore no radiosensitizing chemotherapy will be given.  -----------------------------------  Blair Promise, PhD, MD

## 2016-10-01 DIAGNOSIS — C3412 Malignant neoplasm of upper lobe, left bronchus or lung: Secondary | ICD-10-CM | POA: Diagnosis not present

## 2016-10-01 DIAGNOSIS — C801 Malignant (primary) neoplasm, unspecified: Secondary | ICD-10-CM | POA: Insufficient documentation

## 2016-10-01 DIAGNOSIS — E039 Hypothyroidism, unspecified: Secondary | ICD-10-CM | POA: Diagnosis not present

## 2016-10-01 DIAGNOSIS — Z85850 Personal history of malignant neoplasm of thyroid: Secondary | ICD-10-CM | POA: Diagnosis not present

## 2016-10-01 DIAGNOSIS — Z510 Encounter for antineoplastic radiation therapy: Secondary | ICD-10-CM | POA: Diagnosis not present

## 2016-10-01 DIAGNOSIS — F17210 Nicotine dependence, cigarettes, uncomplicated: Secondary | ICD-10-CM | POA: Diagnosis not present

## 2016-10-01 DIAGNOSIS — R05 Cough: Secondary | ICD-10-CM | POA: Diagnosis not present

## 2016-10-01 DIAGNOSIS — H409 Unspecified glaucoma: Secondary | ICD-10-CM | POA: Diagnosis not present

## 2016-10-01 DIAGNOSIS — F1021 Alcohol dependence, in remission: Secondary | ICD-10-CM | POA: Diagnosis not present

## 2016-10-01 DIAGNOSIS — G8929 Other chronic pain: Secondary | ICD-10-CM | POA: Diagnosis not present

## 2016-10-03 ENCOUNTER — Ambulatory Visit: Payer: Medicare HMO | Admitting: Radiation Oncology

## 2016-10-04 ENCOUNTER — Ambulatory Visit: Payer: Medicare HMO | Admitting: Pulmonary Disease

## 2016-10-04 ENCOUNTER — Ambulatory Visit: Payer: Medicare HMO

## 2016-10-05 ENCOUNTER — Ambulatory Visit: Payer: Medicare HMO

## 2016-10-05 ENCOUNTER — Encounter (HOSPITAL_COMMUNITY): Payer: Self-pay

## 2016-10-05 NOTE — Progress Notes (Signed)
Nutrition  Patient identified on Malnutrition Screening Report and was planning to follow-up with patient on 8/31.    Called patient today and unable to leave message as voice mail box was full.   Tailyn Hantz B. Zenia Resides, [place], [place] Registered Dietitian [phone] (pager)

## 2016-10-08 ENCOUNTER — Ambulatory Visit: Payer: Medicare HMO

## 2016-10-09 ENCOUNTER — Ambulatory Visit: Payer: Medicare HMO

## 2016-10-10 ENCOUNTER — Ambulatory Visit: Payer: Medicare HMO

## 2016-10-11 ENCOUNTER — Ambulatory Visit: Payer: Medicare HMO

## 2016-10-12 ENCOUNTER — Ambulatory Visit: Payer: Medicare HMO

## 2016-10-15 ENCOUNTER — Ambulatory Visit
Admission: RE | Admit: 2016-10-15 | Discharge: 2016-10-15 | Disposition: A | Discharge status: Home / Self Care | Payer: Medicare HMO | Source: Ambulatory Visit | Attending: Radiation Oncology | Admitting: Radiation Oncology

## 2016-10-15 DIAGNOSIS — C3412 Malignant neoplasm of upper lobe, left bronchus or lung: Secondary | ICD-10-CM

## 2016-10-15 DIAGNOSIS — F17210 Nicotine dependence, cigarettes, uncomplicated: Secondary | ICD-10-CM | POA: Diagnosis not present

## 2016-10-15 DIAGNOSIS — R05 Cough: Secondary | ICD-10-CM | POA: Diagnosis not present

## 2016-10-15 DIAGNOSIS — Z510 Encounter for antineoplastic radiation therapy: Secondary | ICD-10-CM | POA: Diagnosis not present

## 2016-10-15 DIAGNOSIS — G8929 Other chronic pain: Secondary | ICD-10-CM | POA: Diagnosis not present

## 2016-10-15 DIAGNOSIS — E039 Hypothyroidism, unspecified: Secondary | ICD-10-CM | POA: Diagnosis not present

## 2016-10-15 DIAGNOSIS — H409 Unspecified glaucoma: Secondary | ICD-10-CM | POA: Diagnosis not present

## 2016-10-15 DIAGNOSIS — F1021 Alcohol dependence, in remission: Secondary | ICD-10-CM | POA: Diagnosis not present

## 2016-10-15 DIAGNOSIS — Z85850 Personal history of malignant neoplasm of thyroid: Secondary | ICD-10-CM | POA: Diagnosis not present

## 2016-10-15 NOTE — Progress Notes (Signed)
  Radiation Oncology         [phone] ________________________________  Name: ALEKSA CATTERTON MRN: [id number]  Date: 10/15/2016  DOB: 07-08-1931  Simulation Verification Note    ICD-10-CM   1. Primary cancer of left upper lobe of lung ([place]) C34.12     Status: outpatient  NARRATIVE: The patient was brought to the treatment unit and placed in the planned treatment position. The clinical setup was verified. Then port films were obtained and uploaded to the radiation oncology medical record software.  The treatment beams were carefully compared against the planned radiation fields. The position location and shape of the radiation fields was reviewed. They targeted volume of tissue appears to be appropriately covered by the radiation beams. Organs at risk appear to be excluded as planned.  Based on my personal review, I approved the simulation verification. The patient's treatment will proceed as planned.  -----------------------------------  Blair Promise, PhD, MD

## 2016-10-16 ENCOUNTER — Ambulatory Visit
Admission: RE | Admit: 2016-10-16 | Discharge: 2016-10-16 | Disposition: A | Discharge status: Home / Self Care | Payer: Medicare HMO | Source: Ambulatory Visit | Attending: Radiation Oncology | Admitting: Radiation Oncology

## 2016-10-16 ENCOUNTER — Ambulatory Visit (HOSPITAL_COMMUNITY): Payer: Medicare HMO

## 2016-10-16 DIAGNOSIS — E039 Hypothyroidism, unspecified: Secondary | ICD-10-CM | POA: Diagnosis not present

## 2016-10-16 DIAGNOSIS — C3412 Malignant neoplasm of upper lobe, left bronchus or lung: Secondary | ICD-10-CM | POA: Diagnosis not present

## 2016-10-16 DIAGNOSIS — H409 Unspecified glaucoma: Secondary | ICD-10-CM | POA: Diagnosis not present

## 2016-10-16 DIAGNOSIS — Z510 Encounter for antineoplastic radiation therapy: Secondary | ICD-10-CM | POA: Diagnosis not present

## 2016-10-16 DIAGNOSIS — G8929 Other chronic pain: Secondary | ICD-10-CM | POA: Diagnosis not present

## 2016-10-16 DIAGNOSIS — F1021 Alcohol dependence, in remission: Secondary | ICD-10-CM | POA: Diagnosis not present

## 2016-10-16 DIAGNOSIS — Z85850 Personal history of malignant neoplasm of thyroid: Secondary | ICD-10-CM | POA: Diagnosis not present

## 2016-10-16 DIAGNOSIS — R05 Cough: Secondary | ICD-10-CM | POA: Diagnosis not present

## 2016-10-16 DIAGNOSIS — F17210 Nicotine dependence, cigarettes, uncomplicated: Secondary | ICD-10-CM | POA: Diagnosis not present

## 2016-10-17 ENCOUNTER — Ambulatory Visit (HOSPITAL_COMMUNITY): Payer: Medicare HMO

## 2016-10-17 ENCOUNTER — Ambulatory Visit
Admission: RE | Admit: 2016-10-17 | Discharge: 2016-10-17 | Disposition: A | Discharge status: Home / Self Care | Payer: Medicare HMO | Source: Ambulatory Visit | Attending: Radiation Oncology | Admitting: Radiation Oncology

## 2016-10-17 DIAGNOSIS — F1021 Alcohol dependence, in remission: Secondary | ICD-10-CM | POA: Diagnosis not present

## 2016-10-17 DIAGNOSIS — R05 Cough: Secondary | ICD-10-CM | POA: Diagnosis not present

## 2016-10-17 DIAGNOSIS — F17210 Nicotine dependence, cigarettes, uncomplicated: Secondary | ICD-10-CM | POA: Diagnosis not present

## 2016-10-17 DIAGNOSIS — G8929 Other chronic pain: Secondary | ICD-10-CM | POA: Diagnosis not present

## 2016-10-17 DIAGNOSIS — C3412 Malignant neoplasm of upper lobe, left bronchus or lung: Secondary | ICD-10-CM | POA: Diagnosis not present

## 2016-10-17 DIAGNOSIS — H409 Unspecified glaucoma: Secondary | ICD-10-CM | POA: Diagnosis not present

## 2016-10-17 DIAGNOSIS — Z85850 Personal history of malignant neoplasm of thyroid: Secondary | ICD-10-CM | POA: Diagnosis not present

## 2016-10-17 DIAGNOSIS — Z510 Encounter for antineoplastic radiation therapy: Secondary | ICD-10-CM | POA: Diagnosis not present

## 2016-10-17 DIAGNOSIS — E039 Hypothyroidism, unspecified: Secondary | ICD-10-CM | POA: Diagnosis not present

## 2016-10-18 ENCOUNTER — Ambulatory Visit
Admission: RE | Admit: 2016-10-18 | Discharge: 2016-10-18 | Disposition: A | Discharge status: Home / Self Care | Payer: Medicare HMO | Source: Ambulatory Visit | Attending: Radiation Oncology | Admitting: Radiation Oncology

## 2016-10-18 DIAGNOSIS — Z85850 Personal history of malignant neoplasm of thyroid: Secondary | ICD-10-CM | POA: Diagnosis not present

## 2016-10-18 DIAGNOSIS — E039 Hypothyroidism, unspecified: Secondary | ICD-10-CM | POA: Diagnosis not present

## 2016-10-18 DIAGNOSIS — R05 Cough: Secondary | ICD-10-CM | POA: Diagnosis not present

## 2016-10-18 DIAGNOSIS — H409 Unspecified glaucoma: Secondary | ICD-10-CM | POA: Diagnosis not present

## 2016-10-18 DIAGNOSIS — F17210 Nicotine dependence, cigarettes, uncomplicated: Secondary | ICD-10-CM | POA: Diagnosis not present

## 2016-10-18 DIAGNOSIS — G8929 Other chronic pain: Secondary | ICD-10-CM | POA: Diagnosis not present

## 2016-10-18 DIAGNOSIS — F1021 Alcohol dependence, in remission: Secondary | ICD-10-CM | POA: Diagnosis not present

## 2016-10-18 DIAGNOSIS — C3412 Malignant neoplasm of upper lobe, left bronchus or lung: Secondary | ICD-10-CM | POA: Diagnosis not present

## 2016-10-18 DIAGNOSIS — Z510 Encounter for antineoplastic radiation therapy: Secondary | ICD-10-CM | POA: Diagnosis not present

## 2016-10-19 ENCOUNTER — Ambulatory Visit
Admission: RE | Admit: 2016-10-19 | Discharge: 2016-10-19 | Disposition: A | Discharge status: Home / Self Care | Payer: Medicare HMO | Source: Ambulatory Visit | Attending: Radiation Oncology | Admitting: Radiation Oncology

## 2016-10-19 DIAGNOSIS — R05 Cough: Secondary | ICD-10-CM | POA: Diagnosis not present

## 2016-10-19 DIAGNOSIS — E039 Hypothyroidism, unspecified: Secondary | ICD-10-CM | POA: Diagnosis not present

## 2016-10-19 DIAGNOSIS — F17210 Nicotine dependence, cigarettes, uncomplicated: Secondary | ICD-10-CM | POA: Diagnosis not present

## 2016-10-19 DIAGNOSIS — Z85850 Personal history of malignant neoplasm of thyroid: Secondary | ICD-10-CM | POA: Diagnosis not present

## 2016-10-19 DIAGNOSIS — Z510 Encounter for antineoplastic radiation therapy: Secondary | ICD-10-CM | POA: Diagnosis not present

## 2016-10-19 DIAGNOSIS — H409 Unspecified glaucoma: Secondary | ICD-10-CM | POA: Diagnosis not present

## 2016-10-19 DIAGNOSIS — F1021 Alcohol dependence, in remission: Secondary | ICD-10-CM | POA: Diagnosis not present

## 2016-10-19 DIAGNOSIS — G8929 Other chronic pain: Secondary | ICD-10-CM | POA: Diagnosis not present

## 2016-10-19 DIAGNOSIS — C3412 Malignant neoplasm of upper lobe, left bronchus or lung: Secondary | ICD-10-CM | POA: Diagnosis not present

## 2016-10-22 ENCOUNTER — Ambulatory Visit
Admission: RE | Admit: 2016-10-22 | Discharge: 2016-10-22 | Disposition: A | Discharge status: Home / Self Care | Payer: Medicare HMO | Source: Ambulatory Visit | Attending: Radiation Oncology | Admitting: Radiation Oncology

## 2016-10-22 DIAGNOSIS — R05 Cough: Secondary | ICD-10-CM | POA: Diagnosis not present

## 2016-10-22 DIAGNOSIS — F17210 Nicotine dependence, cigarettes, uncomplicated: Secondary | ICD-10-CM | POA: Diagnosis not present

## 2016-10-22 DIAGNOSIS — F1021 Alcohol dependence, in remission: Secondary | ICD-10-CM | POA: Diagnosis not present

## 2016-10-22 DIAGNOSIS — Z85850 Personal history of malignant neoplasm of thyroid: Secondary | ICD-10-CM | POA: Diagnosis not present

## 2016-10-22 DIAGNOSIS — E039 Hypothyroidism, unspecified: Secondary | ICD-10-CM | POA: Diagnosis not present

## 2016-10-22 DIAGNOSIS — C3412 Malignant neoplasm of upper lobe, left bronchus or lung: Secondary | ICD-10-CM | POA: Diagnosis not present

## 2016-10-22 DIAGNOSIS — Z510 Encounter for antineoplastic radiation therapy: Secondary | ICD-10-CM | POA: Diagnosis not present

## 2016-10-22 DIAGNOSIS — G8929 Other chronic pain: Secondary | ICD-10-CM | POA: Diagnosis not present

## 2016-10-22 DIAGNOSIS — H409 Unspecified glaucoma: Secondary | ICD-10-CM | POA: Diagnosis not present

## 2016-10-23 ENCOUNTER — Ambulatory Visit
Admission: RE | Admit: 2016-10-23 | Discharge: 2016-10-23 | Disposition: A | Discharge status: Home / Self Care | Payer: Medicare HMO | Source: Ambulatory Visit | Attending: Radiation Oncology | Admitting: Radiation Oncology

## 2016-10-23 DIAGNOSIS — H409 Unspecified glaucoma: Secondary | ICD-10-CM | POA: Diagnosis not present

## 2016-10-23 DIAGNOSIS — E039 Hypothyroidism, unspecified: Secondary | ICD-10-CM | POA: Diagnosis not present

## 2016-10-23 DIAGNOSIS — G8929 Other chronic pain: Secondary | ICD-10-CM | POA: Diagnosis not present

## 2016-10-23 DIAGNOSIS — F1021 Alcohol dependence, in remission: Secondary | ICD-10-CM | POA: Diagnosis not present

## 2016-10-23 DIAGNOSIS — F17210 Nicotine dependence, cigarettes, uncomplicated: Secondary | ICD-10-CM | POA: Diagnosis not present

## 2016-10-23 DIAGNOSIS — C3412 Malignant neoplasm of upper lobe, left bronchus or lung: Secondary | ICD-10-CM | POA: Diagnosis not present

## 2016-10-23 DIAGNOSIS — Z510 Encounter for antineoplastic radiation therapy: Secondary | ICD-10-CM | POA: Diagnosis not present

## 2016-10-23 DIAGNOSIS — Z85850 Personal history of malignant neoplasm of thyroid: Secondary | ICD-10-CM | POA: Diagnosis not present

## 2016-10-23 DIAGNOSIS — R05 Cough: Secondary | ICD-10-CM | POA: Diagnosis not present

## 2016-10-24 ENCOUNTER — Ambulatory Visit
Admission: RE | Admit: 2016-10-24 | Discharge: 2016-10-24 | Disposition: A | Discharge status: Home / Self Care | Payer: Medicare HMO | Source: Ambulatory Visit | Attending: Radiation Oncology | Admitting: Radiation Oncology

## 2016-10-24 DIAGNOSIS — F17210 Nicotine dependence, cigarettes, uncomplicated: Secondary | ICD-10-CM | POA: Diagnosis not present

## 2016-10-24 DIAGNOSIS — Z85850 Personal history of malignant neoplasm of thyroid: Secondary | ICD-10-CM | POA: Diagnosis not present

## 2016-10-24 DIAGNOSIS — R05 Cough: Secondary | ICD-10-CM | POA: Diagnosis not present

## 2016-10-24 DIAGNOSIS — E039 Hypothyroidism, unspecified: Secondary | ICD-10-CM | POA: Diagnosis not present

## 2016-10-24 DIAGNOSIS — Z510 Encounter for antineoplastic radiation therapy: Secondary | ICD-10-CM | POA: Diagnosis not present

## 2016-10-24 DIAGNOSIS — F1021 Alcohol dependence, in remission: Secondary | ICD-10-CM | POA: Diagnosis not present

## 2016-10-24 DIAGNOSIS — G8929 Other chronic pain: Secondary | ICD-10-CM | POA: Diagnosis not present

## 2016-10-24 DIAGNOSIS — C3412 Malignant neoplasm of upper lobe, left bronchus or lung: Secondary | ICD-10-CM | POA: Diagnosis not present

## 2016-10-24 DIAGNOSIS — H409 Unspecified glaucoma: Secondary | ICD-10-CM | POA: Diagnosis not present

## 2016-10-25 ENCOUNTER — Ambulatory Visit
Admission: RE | Admit: 2016-10-25 | Discharge: 2016-10-25 | Disposition: A | Discharge status: Home / Self Care | Payer: Medicare HMO | Source: Ambulatory Visit | Attending: Radiation Oncology | Admitting: Radiation Oncology

## 2016-10-25 DIAGNOSIS — F17210 Nicotine dependence, cigarettes, uncomplicated: Secondary | ICD-10-CM | POA: Diagnosis not present

## 2016-10-25 DIAGNOSIS — G8929 Other chronic pain: Secondary | ICD-10-CM | POA: Diagnosis not present

## 2016-10-25 DIAGNOSIS — C3412 Malignant neoplasm of upper lobe, left bronchus or lung: Secondary | ICD-10-CM | POA: Diagnosis not present

## 2016-10-25 DIAGNOSIS — E039 Hypothyroidism, unspecified: Secondary | ICD-10-CM | POA: Diagnosis not present

## 2016-10-25 DIAGNOSIS — Z510 Encounter for antineoplastic radiation therapy: Secondary | ICD-10-CM | POA: Diagnosis not present

## 2016-10-25 DIAGNOSIS — F1021 Alcohol dependence, in remission: Secondary | ICD-10-CM | POA: Diagnosis not present

## 2016-10-25 DIAGNOSIS — Z85850 Personal history of malignant neoplasm of thyroid: Secondary | ICD-10-CM | POA: Diagnosis not present

## 2016-10-25 DIAGNOSIS — R05 Cough: Secondary | ICD-10-CM | POA: Diagnosis not present

## 2016-10-25 DIAGNOSIS — H409 Unspecified glaucoma: Secondary | ICD-10-CM | POA: Diagnosis not present

## 2016-10-26 ENCOUNTER — Ambulatory Visit
Admission: RE | Admit: 2016-10-26 | Discharge: 2016-10-26 | Disposition: A | Discharge status: Home / Self Care | Payer: Medicare HMO | Source: Ambulatory Visit | Attending: Radiation Oncology | Admitting: Radiation Oncology

## 2016-10-26 DIAGNOSIS — F1021 Alcohol dependence, in remission: Secondary | ICD-10-CM | POA: Diagnosis not present

## 2016-10-26 DIAGNOSIS — C3412 Malignant neoplasm of upper lobe, left bronchus or lung: Secondary | ICD-10-CM | POA: Diagnosis not present

## 2016-10-26 DIAGNOSIS — G8929 Other chronic pain: Secondary | ICD-10-CM | POA: Diagnosis not present

## 2016-10-26 DIAGNOSIS — R05 Cough: Secondary | ICD-10-CM | POA: Diagnosis not present

## 2016-10-26 DIAGNOSIS — F17210 Nicotine dependence, cigarettes, uncomplicated: Secondary | ICD-10-CM | POA: Diagnosis not present

## 2016-10-26 DIAGNOSIS — Z510 Encounter for antineoplastic radiation therapy: Secondary | ICD-10-CM | POA: Diagnosis not present

## 2016-10-26 DIAGNOSIS — Z85850 Personal history of malignant neoplasm of thyroid: Secondary | ICD-10-CM | POA: Diagnosis not present

## 2016-10-26 DIAGNOSIS — H409 Unspecified glaucoma: Secondary | ICD-10-CM | POA: Diagnosis not present

## 2016-10-26 DIAGNOSIS — E039 Hypothyroidism, unspecified: Secondary | ICD-10-CM | POA: Diagnosis not present

## 2016-10-29 ENCOUNTER — Ambulatory Visit
Admission: RE | Admit: 2016-10-29 | Discharge: 2016-10-29 | Disposition: A | Discharge status: Home / Self Care | Payer: Medicare HMO | Source: Ambulatory Visit | Attending: Radiation Oncology | Admitting: Radiation Oncology

## 2016-10-29 DIAGNOSIS — F1021 Alcohol dependence, in remission: Secondary | ICD-10-CM | POA: Diagnosis not present

## 2016-10-29 DIAGNOSIS — E039 Hypothyroidism, unspecified: Secondary | ICD-10-CM | POA: Diagnosis not present

## 2016-10-29 DIAGNOSIS — H409 Unspecified glaucoma: Secondary | ICD-10-CM | POA: Diagnosis not present

## 2016-10-29 DIAGNOSIS — Z85850 Personal history of malignant neoplasm of thyroid: Secondary | ICD-10-CM | POA: Diagnosis not present

## 2016-10-29 DIAGNOSIS — Z510 Encounter for antineoplastic radiation therapy: Secondary | ICD-10-CM | POA: Diagnosis not present

## 2016-10-29 DIAGNOSIS — F17210 Nicotine dependence, cigarettes, uncomplicated: Secondary | ICD-10-CM | POA: Diagnosis not present

## 2016-10-29 DIAGNOSIS — R05 Cough: Secondary | ICD-10-CM | POA: Diagnosis not present

## 2016-10-29 DIAGNOSIS — G8929 Other chronic pain: Secondary | ICD-10-CM | POA: Diagnosis not present

## 2016-10-29 DIAGNOSIS — C3412 Malignant neoplasm of upper lobe, left bronchus or lung: Secondary | ICD-10-CM | POA: Diagnosis not present

## 2016-10-30 ENCOUNTER — Ambulatory Visit
Admission: RE | Admit: 2016-10-30 | Discharge: 2016-10-30 | Disposition: A | Discharge status: Home / Self Care | Payer: Medicare HMO | Source: Ambulatory Visit | Attending: Radiation Oncology | Admitting: Radiation Oncology

## 2016-10-30 DIAGNOSIS — F1021 Alcohol dependence, in remission: Secondary | ICD-10-CM | POA: Diagnosis not present

## 2016-10-30 DIAGNOSIS — C3412 Malignant neoplasm of upper lobe, left bronchus or lung: Secondary | ICD-10-CM | POA: Diagnosis not present

## 2016-10-30 DIAGNOSIS — H409 Unspecified glaucoma: Secondary | ICD-10-CM | POA: Diagnosis not present

## 2016-10-30 DIAGNOSIS — Z85850 Personal history of malignant neoplasm of thyroid: Secondary | ICD-10-CM | POA: Diagnosis not present

## 2016-10-30 DIAGNOSIS — G8929 Other chronic pain: Secondary | ICD-10-CM | POA: Diagnosis not present

## 2016-10-30 DIAGNOSIS — R05 Cough: Secondary | ICD-10-CM | POA: Diagnosis not present

## 2016-10-30 DIAGNOSIS — Z510 Encounter for antineoplastic radiation therapy: Secondary | ICD-10-CM | POA: Diagnosis not present

## 2016-10-30 DIAGNOSIS — E039 Hypothyroidism, unspecified: Secondary | ICD-10-CM | POA: Diagnosis not present

## 2016-10-30 DIAGNOSIS — F17210 Nicotine dependence, cigarettes, uncomplicated: Secondary | ICD-10-CM | POA: Diagnosis not present

## 2016-10-31 ENCOUNTER — Ambulatory Visit
Admission: RE | Admit: 2016-10-31 | Discharge: 2016-10-31 | Disposition: A | Discharge status: Home / Self Care | Payer: Medicare HMO | Source: Ambulatory Visit | Attending: Radiation Oncology | Admitting: Radiation Oncology

## 2016-10-31 DIAGNOSIS — H409 Unspecified glaucoma: Secondary | ICD-10-CM | POA: Diagnosis not present

## 2016-10-31 DIAGNOSIS — C3412 Malignant neoplasm of upper lobe, left bronchus or lung: Secondary | ICD-10-CM | POA: Diagnosis not present

## 2016-10-31 DIAGNOSIS — F1021 Alcohol dependence, in remission: Secondary | ICD-10-CM | POA: Diagnosis not present

## 2016-10-31 DIAGNOSIS — G8929 Other chronic pain: Secondary | ICD-10-CM | POA: Diagnosis not present

## 2016-10-31 DIAGNOSIS — Z510 Encounter for antineoplastic radiation therapy: Secondary | ICD-10-CM | POA: Diagnosis not present

## 2016-10-31 DIAGNOSIS — F17210 Nicotine dependence, cigarettes, uncomplicated: Secondary | ICD-10-CM | POA: Diagnosis not present

## 2016-10-31 DIAGNOSIS — Z85850 Personal history of malignant neoplasm of thyroid: Secondary | ICD-10-CM | POA: Diagnosis not present

## 2016-10-31 DIAGNOSIS — R05 Cough: Secondary | ICD-10-CM | POA: Diagnosis not present

## 2016-10-31 DIAGNOSIS — E039 Hypothyroidism, unspecified: Secondary | ICD-10-CM | POA: Diagnosis not present

## 2016-11-01 ENCOUNTER — Ambulatory Visit: Admission: RE | Admit: 2016-11-01 | Payer: Medicare HMO | Source: Ambulatory Visit

## 2016-11-02 ENCOUNTER — Ambulatory Visit
Admission: RE | Admit: 2016-11-02 | Discharge: 2016-11-02 | Disposition: A | Discharge status: Home / Self Care | Payer: Medicare HMO | Source: Ambulatory Visit | Attending: Radiation Oncology | Admitting: Radiation Oncology

## 2016-11-02 DIAGNOSIS — E039 Hypothyroidism, unspecified: Secondary | ICD-10-CM | POA: Diagnosis not present

## 2016-11-02 DIAGNOSIS — Z85850 Personal history of malignant neoplasm of thyroid: Secondary | ICD-10-CM | POA: Diagnosis not present

## 2016-11-02 DIAGNOSIS — G8929 Other chronic pain: Secondary | ICD-10-CM | POA: Diagnosis not present

## 2016-11-02 DIAGNOSIS — F17210 Nicotine dependence, cigarettes, uncomplicated: Secondary | ICD-10-CM | POA: Diagnosis not present

## 2016-11-02 DIAGNOSIS — H409 Unspecified glaucoma: Secondary | ICD-10-CM | POA: Diagnosis not present

## 2016-11-02 DIAGNOSIS — Z510 Encounter for antineoplastic radiation therapy: Secondary | ICD-10-CM | POA: Diagnosis not present

## 2016-11-02 DIAGNOSIS — F1021 Alcohol dependence, in remission: Secondary | ICD-10-CM | POA: Diagnosis not present

## 2016-11-02 DIAGNOSIS — C3412 Malignant neoplasm of upper lobe, left bronchus or lung: Secondary | ICD-10-CM | POA: Diagnosis not present

## 2016-11-02 DIAGNOSIS — R05 Cough: Secondary | ICD-10-CM | POA: Diagnosis not present

## 2016-11-05 ENCOUNTER — Ambulatory Visit
Admission: RE | Admit: 2016-11-05 | Discharge: 2016-11-05 | Disposition: A | Discharge status: Home / Self Care | Payer: Medicare HMO | Source: Ambulatory Visit | Attending: Radiation Oncology | Admitting: Radiation Oncology

## 2016-11-05 DIAGNOSIS — E039 Hypothyroidism, unspecified: Secondary | ICD-10-CM | POA: Diagnosis not present

## 2016-11-05 DIAGNOSIS — C3412 Malignant neoplasm of upper lobe, left bronchus or lung: Secondary | ICD-10-CM | POA: Diagnosis not present

## 2016-11-05 DIAGNOSIS — H409 Unspecified glaucoma: Secondary | ICD-10-CM | POA: Diagnosis not present

## 2016-11-05 DIAGNOSIS — R05 Cough: Secondary | ICD-10-CM | POA: Diagnosis not present

## 2016-11-05 DIAGNOSIS — Z510 Encounter for antineoplastic radiation therapy: Secondary | ICD-10-CM | POA: Diagnosis not present

## 2016-11-05 DIAGNOSIS — F1021 Alcohol dependence, in remission: Secondary | ICD-10-CM | POA: Diagnosis not present

## 2016-11-05 DIAGNOSIS — F17210 Nicotine dependence, cigarettes, uncomplicated: Secondary | ICD-10-CM | POA: Diagnosis not present

## 2016-11-05 DIAGNOSIS — Z85850 Personal history of malignant neoplasm of thyroid: Secondary | ICD-10-CM | POA: Diagnosis not present

## 2016-11-05 DIAGNOSIS — G8929 Other chronic pain: Secondary | ICD-10-CM | POA: Diagnosis not present

## 2016-11-06 ENCOUNTER — Ambulatory Visit
Admission: RE | Admit: 2016-11-06 | Discharge: 2016-11-06 | Disposition: A | Discharge status: Home / Self Care | Payer: Medicare HMO | Source: Ambulatory Visit | Attending: Radiation Oncology | Admitting: Radiation Oncology

## 2016-11-06 DIAGNOSIS — H409 Unspecified glaucoma: Secondary | ICD-10-CM | POA: Diagnosis not present

## 2016-11-06 DIAGNOSIS — Z85850 Personal history of malignant neoplasm of thyroid: Secondary | ICD-10-CM | POA: Diagnosis not present

## 2016-11-06 DIAGNOSIS — F17210 Nicotine dependence, cigarettes, uncomplicated: Secondary | ICD-10-CM | POA: Diagnosis not present

## 2016-11-06 DIAGNOSIS — C3412 Malignant neoplasm of upper lobe, left bronchus or lung: Secondary | ICD-10-CM | POA: Diagnosis not present

## 2016-11-06 DIAGNOSIS — Z510 Encounter for antineoplastic radiation therapy: Secondary | ICD-10-CM | POA: Diagnosis not present

## 2016-11-06 DIAGNOSIS — R05 Cough: Secondary | ICD-10-CM | POA: Diagnosis not present

## 2016-11-06 DIAGNOSIS — F1021 Alcohol dependence, in remission: Secondary | ICD-10-CM | POA: Diagnosis not present

## 2016-11-06 DIAGNOSIS — E039 Hypothyroidism, unspecified: Secondary | ICD-10-CM | POA: Diagnosis not present

## 2016-11-06 DIAGNOSIS — G8929 Other chronic pain: Secondary | ICD-10-CM | POA: Diagnosis not present

## 2016-11-06 MED ORDER — SONAFINE EX EMUL: 1.0000 "application " | Freq: Once | CUTANEOUS | Status: DC

## 2016-11-07 ENCOUNTER — Ambulatory Visit
Admission: RE | Admit: 2016-11-07 | Discharge: 2016-11-07 | Disposition: A | Discharge status: Home / Self Care | Payer: Medicare HMO | Source: Ambulatory Visit | Attending: Radiation Oncology | Admitting: Radiation Oncology

## 2016-11-07 DIAGNOSIS — E039 Hypothyroidism, unspecified: Secondary | ICD-10-CM | POA: Diagnosis not present

## 2016-11-07 DIAGNOSIS — Z510 Encounter for antineoplastic radiation therapy: Secondary | ICD-10-CM | POA: Diagnosis not present

## 2016-11-07 DIAGNOSIS — F17210 Nicotine dependence, cigarettes, uncomplicated: Secondary | ICD-10-CM | POA: Diagnosis not present

## 2016-11-07 DIAGNOSIS — Z85850 Personal history of malignant neoplasm of thyroid: Secondary | ICD-10-CM | POA: Diagnosis not present

## 2016-11-07 DIAGNOSIS — G8929 Other chronic pain: Secondary | ICD-10-CM | POA: Diagnosis not present

## 2016-11-07 DIAGNOSIS — C3412 Malignant neoplasm of upper lobe, left bronchus or lung: Secondary | ICD-10-CM | POA: Diagnosis not present

## 2016-11-07 DIAGNOSIS — F1021 Alcohol dependence, in remission: Secondary | ICD-10-CM | POA: Diagnosis not present

## 2016-11-07 DIAGNOSIS — R05 Cough: Secondary | ICD-10-CM | POA: Diagnosis not present

## 2016-11-07 DIAGNOSIS — H409 Unspecified glaucoma: Secondary | ICD-10-CM | POA: Diagnosis not present

## 2016-11-08 ENCOUNTER — Ambulatory Visit
Admission: RE | Admit: 2016-11-08 | Discharge: 2016-11-08 | Disposition: A | Discharge status: Home / Self Care | Payer: Medicare HMO | Source: Ambulatory Visit | Attending: Radiation Oncology | Admitting: Radiation Oncology

## 2016-11-08 DIAGNOSIS — G8929 Other chronic pain: Secondary | ICD-10-CM | POA: Diagnosis not present

## 2016-11-08 DIAGNOSIS — R05 Cough: Secondary | ICD-10-CM | POA: Diagnosis not present

## 2016-11-08 DIAGNOSIS — F1021 Alcohol dependence, in remission: Secondary | ICD-10-CM | POA: Diagnosis not present

## 2016-11-08 DIAGNOSIS — E039 Hypothyroidism, unspecified: Secondary | ICD-10-CM | POA: Diagnosis not present

## 2016-11-08 DIAGNOSIS — Z85850 Personal history of malignant neoplasm of thyroid: Secondary | ICD-10-CM | POA: Diagnosis not present

## 2016-11-08 DIAGNOSIS — Z510 Encounter for antineoplastic radiation therapy: Secondary | ICD-10-CM | POA: Diagnosis not present

## 2016-11-08 DIAGNOSIS — H409 Unspecified glaucoma: Secondary | ICD-10-CM | POA: Diagnosis not present

## 2016-11-08 DIAGNOSIS — F17210 Nicotine dependence, cigarettes, uncomplicated: Secondary | ICD-10-CM | POA: Diagnosis not present

## 2016-11-08 DIAGNOSIS — C3412 Malignant neoplasm of upper lobe, left bronchus or lung: Secondary | ICD-10-CM | POA: Diagnosis not present

## 2016-11-09 ENCOUNTER — Ambulatory Visit
Admission: RE | Admit: 2016-11-09 | Discharge: 2016-11-09 | Disposition: A | Discharge status: Home / Self Care | Payer: Medicare HMO | Source: Ambulatory Visit | Attending: Radiation Oncology | Admitting: Radiation Oncology

## 2016-11-09 DIAGNOSIS — G8929 Other chronic pain: Secondary | ICD-10-CM | POA: Diagnosis not present

## 2016-11-09 DIAGNOSIS — Z85850 Personal history of malignant neoplasm of thyroid: Secondary | ICD-10-CM | POA: Diagnosis not present

## 2016-11-09 DIAGNOSIS — C3412 Malignant neoplasm of upper lobe, left bronchus or lung: Secondary | ICD-10-CM | POA: Diagnosis not present

## 2016-11-09 DIAGNOSIS — F1021 Alcohol dependence, in remission: Secondary | ICD-10-CM | POA: Diagnosis not present

## 2016-11-09 DIAGNOSIS — F17210 Nicotine dependence, cigarettes, uncomplicated: Secondary | ICD-10-CM | POA: Diagnosis not present

## 2016-11-09 DIAGNOSIS — H409 Unspecified glaucoma: Secondary | ICD-10-CM | POA: Diagnosis not present

## 2016-11-09 DIAGNOSIS — R05 Cough: Secondary | ICD-10-CM | POA: Diagnosis not present

## 2016-11-09 DIAGNOSIS — Z510 Encounter for antineoplastic radiation therapy: Secondary | ICD-10-CM | POA: Diagnosis not present

## 2016-11-09 DIAGNOSIS — E039 Hypothyroidism, unspecified: Secondary | ICD-10-CM | POA: Diagnosis not present

## 2016-11-12 ENCOUNTER — Ambulatory Visit
Admission: RE | Admit: 2016-11-12 | Discharge: 2016-11-12 | Disposition: A | Discharge status: Home / Self Care | Payer: Medicare HMO | Source: Ambulatory Visit | Attending: Radiation Oncology | Admitting: Radiation Oncology

## 2016-11-12 DIAGNOSIS — F17210 Nicotine dependence, cigarettes, uncomplicated: Secondary | ICD-10-CM | POA: Diagnosis not present

## 2016-11-12 DIAGNOSIS — H409 Unspecified glaucoma: Secondary | ICD-10-CM | POA: Diagnosis not present

## 2016-11-12 DIAGNOSIS — Z85850 Personal history of malignant neoplasm of thyroid: Secondary | ICD-10-CM | POA: Diagnosis not present

## 2016-11-12 DIAGNOSIS — R05 Cough: Secondary | ICD-10-CM | POA: Diagnosis not present

## 2016-11-12 DIAGNOSIS — C3412 Malignant neoplasm of upper lobe, left bronchus or lung: Secondary | ICD-10-CM | POA: Diagnosis not present

## 2016-11-12 DIAGNOSIS — G8929 Other chronic pain: Secondary | ICD-10-CM | POA: Diagnosis not present

## 2016-11-12 DIAGNOSIS — F1021 Alcohol dependence, in remission: Secondary | ICD-10-CM | POA: Diagnosis not present

## 2016-11-12 DIAGNOSIS — E039 Hypothyroidism, unspecified: Secondary | ICD-10-CM | POA: Diagnosis not present

## 2016-11-12 DIAGNOSIS — Z510 Encounter for antineoplastic radiation therapy: Secondary | ICD-10-CM | POA: Diagnosis not present

## 2016-11-13 ENCOUNTER — Ambulatory Visit
Admission: RE | Admit: 2016-11-13 | Discharge: 2016-11-13 | Disposition: A | Discharge status: Home / Self Care | Payer: Medicare HMO | Source: Ambulatory Visit | Attending: Radiation Oncology | Admitting: Radiation Oncology

## 2016-11-13 DIAGNOSIS — G8929 Other chronic pain: Secondary | ICD-10-CM | POA: Diagnosis not present

## 2016-11-13 DIAGNOSIS — Z510 Encounter for antineoplastic radiation therapy: Secondary | ICD-10-CM | POA: Diagnosis not present

## 2016-11-13 DIAGNOSIS — F17210 Nicotine dependence, cigarettes, uncomplicated: Secondary | ICD-10-CM | POA: Diagnosis not present

## 2016-11-13 DIAGNOSIS — H409 Unspecified glaucoma: Secondary | ICD-10-CM | POA: Diagnosis not present

## 2016-11-13 DIAGNOSIS — C3412 Malignant neoplasm of upper lobe, left bronchus or lung: Secondary | ICD-10-CM | POA: Diagnosis not present

## 2016-11-13 DIAGNOSIS — F1021 Alcohol dependence, in remission: Secondary | ICD-10-CM | POA: Diagnosis not present

## 2016-11-13 DIAGNOSIS — R05 Cough: Secondary | ICD-10-CM | POA: Diagnosis not present

## 2016-11-13 DIAGNOSIS — Z85850 Personal history of malignant neoplasm of thyroid: Secondary | ICD-10-CM | POA: Diagnosis not present

## 2016-11-13 DIAGNOSIS — E039 Hypothyroidism, unspecified: Secondary | ICD-10-CM | POA: Diagnosis not present

## 2016-11-14 ENCOUNTER — Other Ambulatory Visit: Payer: Self-pay | Admitting: Family Medicine

## 2016-11-14 ENCOUNTER — Ambulatory Visit
Admission: RE | Admit: 2016-11-14 | Discharge: 2016-11-14 | Disposition: A | Discharge status: Home / Self Care | Payer: Medicare HMO | Source: Ambulatory Visit | Attending: Radiation Oncology | Admitting: Radiation Oncology

## 2016-11-14 DIAGNOSIS — F17210 Nicotine dependence, cigarettes, uncomplicated: Secondary | ICD-10-CM | POA: Diagnosis not present

## 2016-11-14 DIAGNOSIS — R05 Cough: Secondary | ICD-10-CM | POA: Diagnosis not present

## 2016-11-14 DIAGNOSIS — E039 Hypothyroidism, unspecified: Secondary | ICD-10-CM | POA: Diagnosis not present

## 2016-11-14 DIAGNOSIS — G8929 Other chronic pain: Secondary | ICD-10-CM | POA: Diagnosis not present

## 2016-11-14 DIAGNOSIS — F1021 Alcohol dependence, in remission: Secondary | ICD-10-CM | POA: Diagnosis not present

## 2016-11-14 DIAGNOSIS — Z510 Encounter for antineoplastic radiation therapy: Secondary | ICD-10-CM | POA: Diagnosis not present

## 2016-11-14 DIAGNOSIS — H409 Unspecified glaucoma: Secondary | ICD-10-CM | POA: Diagnosis not present

## 2016-11-14 DIAGNOSIS — Z85850 Personal history of malignant neoplasm of thyroid: Secondary | ICD-10-CM | POA: Diagnosis not present

## 2016-11-14 DIAGNOSIS — C3412 Malignant neoplasm of upper lobe, left bronchus or lung: Secondary | ICD-10-CM | POA: Diagnosis not present

## 2016-11-15 ENCOUNTER — Ambulatory Visit
Admission: RE | Admit: 2016-11-15 | Discharge: 2016-11-15 | Disposition: A | Discharge status: Home / Self Care | Payer: Medicare HMO | Source: Ambulatory Visit | Attending: Radiation Oncology | Admitting: Radiation Oncology

## 2016-11-15 ENCOUNTER — Ambulatory Visit: Payer: Medicare HMO

## 2016-11-15 DIAGNOSIS — G8929 Other chronic pain: Secondary | ICD-10-CM | POA: Diagnosis not present

## 2016-11-15 DIAGNOSIS — C3412 Malignant neoplasm of upper lobe, left bronchus or lung: Secondary | ICD-10-CM | POA: Diagnosis not present

## 2016-11-15 DIAGNOSIS — Z510 Encounter for antineoplastic radiation therapy: Secondary | ICD-10-CM | POA: Diagnosis not present

## 2016-11-15 DIAGNOSIS — E039 Hypothyroidism, unspecified: Secondary | ICD-10-CM | POA: Diagnosis not present

## 2016-11-15 DIAGNOSIS — R05 Cough: Secondary | ICD-10-CM | POA: Diagnosis not present

## 2016-11-15 DIAGNOSIS — Z85850 Personal history of malignant neoplasm of thyroid: Secondary | ICD-10-CM | POA: Diagnosis not present

## 2016-11-15 DIAGNOSIS — H409 Unspecified glaucoma: Secondary | ICD-10-CM | POA: Diagnosis not present

## 2016-11-15 DIAGNOSIS — F1021 Alcohol dependence, in remission: Secondary | ICD-10-CM | POA: Diagnosis not present

## 2016-11-15 DIAGNOSIS — F17210 Nicotine dependence, cigarettes, uncomplicated: Secondary | ICD-10-CM | POA: Diagnosis not present

## 2016-11-16 ENCOUNTER — Ambulatory Visit
Admission: RE | Admit: 2016-11-16 | Discharge: 2016-11-16 | Disposition: A | Discharge status: Home / Self Care | Payer: Medicare HMO | Source: Ambulatory Visit | Attending: Radiation Oncology | Admitting: Radiation Oncology

## 2016-11-16 DIAGNOSIS — F17210 Nicotine dependence, cigarettes, uncomplicated: Secondary | ICD-10-CM | POA: Diagnosis not present

## 2016-11-16 DIAGNOSIS — C3412 Malignant neoplasm of upper lobe, left bronchus or lung: Secondary | ICD-10-CM | POA: Diagnosis not present

## 2016-11-16 DIAGNOSIS — R05 Cough: Secondary | ICD-10-CM | POA: Diagnosis not present

## 2016-11-16 DIAGNOSIS — Z510 Encounter for antineoplastic radiation therapy: Secondary | ICD-10-CM | POA: Diagnosis not present

## 2016-11-16 DIAGNOSIS — G8929 Other chronic pain: Secondary | ICD-10-CM | POA: Diagnosis not present

## 2016-11-16 DIAGNOSIS — F1021 Alcohol dependence, in remission: Secondary | ICD-10-CM | POA: Diagnosis not present

## 2016-11-16 DIAGNOSIS — Z85850 Personal history of malignant neoplasm of thyroid: Secondary | ICD-10-CM | POA: Diagnosis not present

## 2016-11-16 DIAGNOSIS — H409 Unspecified glaucoma: Secondary | ICD-10-CM | POA: Diagnosis not present

## 2016-11-16 DIAGNOSIS — E039 Hypothyroidism, unspecified: Secondary | ICD-10-CM | POA: Diagnosis not present

## 2016-11-19 ENCOUNTER — Ambulatory Visit
Admission: RE | Admit: 2016-11-19 | Discharge: 2016-11-19 | Disposition: A | Discharge status: Home / Self Care | Payer: Medicare HMO | Source: Ambulatory Visit | Attending: Radiation Oncology | Admitting: Radiation Oncology

## 2016-11-19 DIAGNOSIS — E039 Hypothyroidism, unspecified: Secondary | ICD-10-CM | POA: Diagnosis not present

## 2016-11-19 DIAGNOSIS — Z85850 Personal history of malignant neoplasm of thyroid: Secondary | ICD-10-CM | POA: Diagnosis not present

## 2016-11-19 DIAGNOSIS — R05 Cough: Secondary | ICD-10-CM | POA: Diagnosis not present

## 2016-11-19 DIAGNOSIS — Z510 Encounter for antineoplastic radiation therapy: Secondary | ICD-10-CM | POA: Diagnosis not present

## 2016-11-19 DIAGNOSIS — F17210 Nicotine dependence, cigarettes, uncomplicated: Secondary | ICD-10-CM | POA: Diagnosis not present

## 2016-11-19 DIAGNOSIS — C3412 Malignant neoplasm of upper lobe, left bronchus or lung: Secondary | ICD-10-CM | POA: Diagnosis not present

## 2016-11-19 DIAGNOSIS — F1021 Alcohol dependence, in remission: Secondary | ICD-10-CM | POA: Diagnosis not present

## 2016-11-19 DIAGNOSIS — G8929 Other chronic pain: Secondary | ICD-10-CM | POA: Diagnosis not present

## 2016-11-19 DIAGNOSIS — H409 Unspecified glaucoma: Secondary | ICD-10-CM | POA: Diagnosis not present

## 2016-11-20 ENCOUNTER — Ambulatory Visit
Admission: RE | Admit: 2016-11-20 | Discharge: 2016-11-20 | Disposition: A | Discharge status: Home / Self Care | Payer: Medicare HMO | Source: Ambulatory Visit | Attending: Radiation Oncology | Admitting: Radiation Oncology

## 2016-11-20 DIAGNOSIS — C3412 Malignant neoplasm of upper lobe, left bronchus or lung: Secondary | ICD-10-CM | POA: Diagnosis not present

## 2016-11-20 DIAGNOSIS — G8929 Other chronic pain: Secondary | ICD-10-CM | POA: Diagnosis not present

## 2016-11-20 DIAGNOSIS — F1021 Alcohol dependence, in remission: Secondary | ICD-10-CM | POA: Diagnosis not present

## 2016-11-20 DIAGNOSIS — Z510 Encounter for antineoplastic radiation therapy: Secondary | ICD-10-CM | POA: Diagnosis not present

## 2016-11-20 DIAGNOSIS — H409 Unspecified glaucoma: Secondary | ICD-10-CM | POA: Diagnosis not present

## 2016-11-20 DIAGNOSIS — R05 Cough: Secondary | ICD-10-CM | POA: Diagnosis not present

## 2016-11-20 DIAGNOSIS — F17210 Nicotine dependence, cigarettes, uncomplicated: Secondary | ICD-10-CM | POA: Diagnosis not present

## 2016-11-20 DIAGNOSIS — Z85850 Personal history of malignant neoplasm of thyroid: Secondary | ICD-10-CM | POA: Diagnosis not present

## 2016-11-20 DIAGNOSIS — E039 Hypothyroidism, unspecified: Secondary | ICD-10-CM | POA: Diagnosis not present

## 2016-11-20 MED ORDER — SONAFINE EX EMUL
1.0000 "application " | Freq: Two times a day (BID) | CUTANEOUS | Status: DC
  Administered 2016-11-20: 1 via TOPICAL

## 2016-11-21 ENCOUNTER — Ambulatory Visit
Admission: RE | Admit: 2016-11-21 | Discharge: 2016-11-21 | Disposition: A | Discharge status: Home / Self Care | Payer: Medicare HMO | Source: Ambulatory Visit | Attending: Radiation Oncology | Admitting: Radiation Oncology

## 2016-11-21 DIAGNOSIS — F1021 Alcohol dependence, in remission: Secondary | ICD-10-CM | POA: Diagnosis not present

## 2016-11-21 DIAGNOSIS — R05 Cough: Secondary | ICD-10-CM | POA: Diagnosis not present

## 2016-11-21 DIAGNOSIS — H409 Unspecified glaucoma: Secondary | ICD-10-CM | POA: Diagnosis not present

## 2016-11-21 DIAGNOSIS — G8929 Other chronic pain: Secondary | ICD-10-CM | POA: Diagnosis not present

## 2016-11-21 DIAGNOSIS — Z85850 Personal history of malignant neoplasm of thyroid: Secondary | ICD-10-CM | POA: Diagnosis not present

## 2016-11-21 DIAGNOSIS — C3412 Malignant neoplasm of upper lobe, left bronchus or lung: Secondary | ICD-10-CM | POA: Diagnosis not present

## 2016-11-21 DIAGNOSIS — E039 Hypothyroidism, unspecified: Secondary | ICD-10-CM | POA: Diagnosis not present

## 2016-11-21 DIAGNOSIS — F17210 Nicotine dependence, cigarettes, uncomplicated: Secondary | ICD-10-CM | POA: Diagnosis not present

## 2016-11-21 DIAGNOSIS — Z510 Encounter for antineoplastic radiation therapy: Secondary | ICD-10-CM | POA: Diagnosis not present

## 2016-11-22 ENCOUNTER — Ambulatory Visit
Admission: RE | Admit: 2016-11-22 | Discharge: 2016-11-22 | Disposition: A | Discharge status: Home / Self Care | Payer: Medicare HMO | Source: Ambulatory Visit | Attending: Radiation Oncology | Admitting: Radiation Oncology

## 2016-11-22 DIAGNOSIS — H409 Unspecified glaucoma: Secondary | ICD-10-CM | POA: Diagnosis not present

## 2016-11-22 DIAGNOSIS — E039 Hypothyroidism, unspecified: Secondary | ICD-10-CM | POA: Diagnosis not present

## 2016-11-22 DIAGNOSIS — F1021 Alcohol dependence, in remission: Secondary | ICD-10-CM | POA: Diagnosis not present

## 2016-11-22 DIAGNOSIS — Z510 Encounter for antineoplastic radiation therapy: Secondary | ICD-10-CM | POA: Diagnosis not present

## 2016-11-22 DIAGNOSIS — F17210 Nicotine dependence, cigarettes, uncomplicated: Secondary | ICD-10-CM | POA: Diagnosis not present

## 2016-11-22 DIAGNOSIS — Z85850 Personal history of malignant neoplasm of thyroid: Secondary | ICD-10-CM | POA: Diagnosis not present

## 2016-11-22 DIAGNOSIS — C3412 Malignant neoplasm of upper lobe, left bronchus or lung: Secondary | ICD-10-CM | POA: Diagnosis not present

## 2016-11-22 DIAGNOSIS — G8929 Other chronic pain: Secondary | ICD-10-CM | POA: Diagnosis not present

## 2016-11-22 DIAGNOSIS — R05 Cough: Secondary | ICD-10-CM | POA: Diagnosis not present

## 2016-11-23 ENCOUNTER — Ambulatory Visit
Admission: RE | Admit: 2016-11-23 | Discharge: 2016-11-23 | Disposition: A | Discharge status: Home / Self Care | Payer: Medicare HMO | Source: Ambulatory Visit | Attending: Radiation Oncology | Admitting: Radiation Oncology

## 2016-11-23 DIAGNOSIS — E039 Hypothyroidism, unspecified: Secondary | ICD-10-CM | POA: Diagnosis not present

## 2016-11-23 DIAGNOSIS — G8929 Other chronic pain: Secondary | ICD-10-CM | POA: Diagnosis not present

## 2016-11-23 DIAGNOSIS — H409 Unspecified glaucoma: Secondary | ICD-10-CM | POA: Diagnosis not present

## 2016-11-23 DIAGNOSIS — F1021 Alcohol dependence, in remission: Secondary | ICD-10-CM | POA: Diagnosis not present

## 2016-11-23 DIAGNOSIS — Z85850 Personal history of malignant neoplasm of thyroid: Secondary | ICD-10-CM | POA: Diagnosis not present

## 2016-11-23 DIAGNOSIS — R05 Cough: Secondary | ICD-10-CM | POA: Diagnosis not present

## 2016-11-23 DIAGNOSIS — C3412 Malignant neoplasm of upper lobe, left bronchus or lung: Secondary | ICD-10-CM | POA: Diagnosis not present

## 2016-11-23 DIAGNOSIS — F17210 Nicotine dependence, cigarettes, uncomplicated: Secondary | ICD-10-CM | POA: Diagnosis not present

## 2016-11-23 DIAGNOSIS — Z510 Encounter for antineoplastic radiation therapy: Secondary | ICD-10-CM | POA: Diagnosis not present

## 2016-11-26 ENCOUNTER — Ambulatory Visit
Admission: RE | Admit: 2016-11-26 | Discharge: 2016-11-26 | Disposition: A | Discharge status: Home / Self Care | Payer: Medicare HMO | Source: Ambulatory Visit | Attending: Radiation Oncology | Admitting: Radiation Oncology

## 2016-11-26 ENCOUNTER — Ambulatory Visit: Payer: Medicare HMO

## 2016-11-26 DIAGNOSIS — R05 Cough: Secondary | ICD-10-CM | POA: Diagnosis not present

## 2016-11-26 DIAGNOSIS — F1021 Alcohol dependence, in remission: Secondary | ICD-10-CM | POA: Diagnosis not present

## 2016-11-26 DIAGNOSIS — E039 Hypothyroidism, unspecified: Secondary | ICD-10-CM | POA: Diagnosis not present

## 2016-11-26 DIAGNOSIS — G8929 Other chronic pain: Secondary | ICD-10-CM | POA: Diagnosis not present

## 2016-11-26 DIAGNOSIS — Z85850 Personal history of malignant neoplasm of thyroid: Secondary | ICD-10-CM | POA: Diagnosis not present

## 2016-11-26 DIAGNOSIS — Z510 Encounter for antineoplastic radiation therapy: Secondary | ICD-10-CM | POA: Diagnosis not present

## 2016-11-26 DIAGNOSIS — F17210 Nicotine dependence, cigarettes, uncomplicated: Secondary | ICD-10-CM | POA: Diagnosis not present

## 2016-11-26 DIAGNOSIS — C3412 Malignant neoplasm of upper lobe, left bronchus or lung: Secondary | ICD-10-CM | POA: Diagnosis not present

## 2016-11-26 DIAGNOSIS — H409 Unspecified glaucoma: Secondary | ICD-10-CM | POA: Diagnosis not present

## 2016-11-27 ENCOUNTER — Ambulatory Visit
Admission: RE | Admit: 2016-11-27 | Discharge: 2016-11-27 | Disposition: A | Discharge status: Home / Self Care | Payer: Medicare HMO | Source: Ambulatory Visit | Attending: Radiation Oncology | Admitting: Radiation Oncology

## 2016-11-27 ENCOUNTER — Ambulatory Visit: Payer: Medicare HMO

## 2016-11-27 DIAGNOSIS — Z85850 Personal history of malignant neoplasm of thyroid: Secondary | ICD-10-CM | POA: Diagnosis not present

## 2016-11-27 DIAGNOSIS — E039 Hypothyroidism, unspecified: Secondary | ICD-10-CM | POA: Diagnosis not present

## 2016-11-27 DIAGNOSIS — F17210 Nicotine dependence, cigarettes, uncomplicated: Secondary | ICD-10-CM | POA: Diagnosis not present

## 2016-11-27 DIAGNOSIS — H409 Unspecified glaucoma: Secondary | ICD-10-CM | POA: Diagnosis not present

## 2016-11-27 DIAGNOSIS — F1021 Alcohol dependence, in remission: Secondary | ICD-10-CM | POA: Diagnosis not present

## 2016-11-27 DIAGNOSIS — G8929 Other chronic pain: Secondary | ICD-10-CM | POA: Diagnosis not present

## 2016-11-27 DIAGNOSIS — Z510 Encounter for antineoplastic radiation therapy: Secondary | ICD-10-CM | POA: Diagnosis not present

## 2016-11-27 DIAGNOSIS — C3412 Malignant neoplasm of upper lobe, left bronchus or lung: Secondary | ICD-10-CM | POA: Diagnosis not present

## 2016-11-27 DIAGNOSIS — R05 Cough: Secondary | ICD-10-CM | POA: Diagnosis not present

## 2016-11-28 ENCOUNTER — Ambulatory Visit
Admission: RE | Admit: 2016-11-28 | Discharge: 2016-11-28 | Disposition: A | Discharge status: Home / Self Care | Payer: Medicare HMO | Source: Ambulatory Visit | Attending: Radiation Oncology | Admitting: Radiation Oncology

## 2016-11-28 DIAGNOSIS — Z510 Encounter for antineoplastic radiation therapy: Secondary | ICD-10-CM | POA: Diagnosis not present

## 2016-11-28 DIAGNOSIS — H409 Unspecified glaucoma: Secondary | ICD-10-CM | POA: Diagnosis not present

## 2016-11-28 DIAGNOSIS — C3412 Malignant neoplasm of upper lobe, left bronchus or lung: Secondary | ICD-10-CM | POA: Diagnosis not present

## 2016-11-28 DIAGNOSIS — E039 Hypothyroidism, unspecified: Secondary | ICD-10-CM | POA: Diagnosis not present

## 2016-11-28 DIAGNOSIS — F1021 Alcohol dependence, in remission: Secondary | ICD-10-CM | POA: Diagnosis not present

## 2016-11-28 DIAGNOSIS — F17210 Nicotine dependence, cigarettes, uncomplicated: Secondary | ICD-10-CM | POA: Diagnosis not present

## 2016-11-28 DIAGNOSIS — G8929 Other chronic pain: Secondary | ICD-10-CM | POA: Diagnosis not present

## 2016-11-28 DIAGNOSIS — R05 Cough: Secondary | ICD-10-CM | POA: Diagnosis not present

## 2016-11-28 DIAGNOSIS — Z85850 Personal history of malignant neoplasm of thyroid: Secondary | ICD-10-CM | POA: Diagnosis not present

## 2016-11-29 ENCOUNTER — Encounter: Payer: Self-pay | Admitting: Radiation Oncology

## 2016-11-29 NOTE — Progress Notes (Signed)
  Radiation Oncology         [phone] ________________________________  Name: Kevin Ho MRN: [id number]  Date: 11/29/2016  DOB: 06-10-1931  End of Treatment Note  Diagnosis:   Stage III non-small cell lung cancer, favoring adenocarcinoma   Indication for treatment:  Curative       Radiation treatment dates:   10/15/2016-11/28/2016  Site/dose:   Left lung, 2 Gy in 32 fractions for a total dose of 64 Gy  Beams/energy:   3D//6X, 10X  Narrative: The patient tolerated radiation treatment relatively well. Pt denied having a decreased appetite throughout treatments. At the beginning of his treatments, he reported mild fatigue, intermittent productive cough with clear sputum, intermittent SOB, and mild trouble swallowing. He initially denied pain at the beginning of his treatment. Towards the end of his treatment, he reported frequent headaches and dizziness (chronic problem), neck pain, lower back pain, continued trouble swallowing, and intermittent cough with white/yellow sputum. He denied SOB or skin related changes towards the end of treatment.    Plan: The patient has completed radiation treatment. The patient will return to radiation oncology clinic for routine followup in one month. I advised them to call or return sooner if they have any questions or concerns related to their recovery or treatment.  -----------------------------------  Blair Promise, PhD, MD  This document serves as a record of services personally performed by Gery Pray, MD. It was created on his behalf by [facility], a trained medical scribe. The creation of this record is based on the scribe's personal observations and the provider's statements to them. This document has been checked and approved by the attending provider.

## 2016-11-30 ENCOUNTER — Ambulatory Visit (HOSPITAL_COMMUNITY): Payer: Medicare HMO

## 2016-12-28 ENCOUNTER — Encounter: Payer: Self-pay | Admitting: Oncology

## 2016-12-31 ENCOUNTER — Telehealth: Payer: Self-pay | Admitting: Oncology

## 2016-12-31 ENCOUNTER — Ambulatory Visit: Payer: Medicare HMO | Admitting: Radiation Oncology

## 2016-12-31 NOTE — Telephone Encounter (Signed)
Called patient's daughter in law about his appointment today.  She said they did not know about the appointment and need to reschedule.  Transferred her to Romie Jumper, Medical Secretary to reschedule.

## 2017-01-17 ENCOUNTER — Ambulatory Visit
Admission: RE | Admit: 2017-01-17 | Discharge: 2017-01-17 | Disposition: A | Discharge status: Home / Self Care | Payer: Medicare HMO | Source: Ambulatory Visit | Attending: Radiation Oncology | Admitting: Radiation Oncology

## 2017-01-21 ENCOUNTER — Telehealth: Payer: Self-pay | Admitting: Oncology

## 2017-01-21 ENCOUNTER — Ambulatory Visit
Admission: RE | Admit: 2017-01-21 | Discharge: 2017-01-21 | Disposition: A | Discharge status: Home / Self Care | Payer: Medicare HMO | Source: Ambulatory Visit | Attending: Radiation Oncology | Admitting: Radiation Oncology

## 2017-01-21 NOTE — Telephone Encounter (Signed)
Left message with patient's son regarding the follow up scheduled for today.  Requested a return call.

## 2017-02-28 ENCOUNTER — Ambulatory Visit (INDEPENDENT_AMBULATORY_CARE_PROVIDER_SITE_OTHER): Payer: Medicare HMO | Admitting: *Deleted

## 2017-02-28 VITALS — BP 164/82 | HR 69 | Temp 97.0°F | Ht 65.5 in | Wt 128.0 lb

## 2017-02-28 DIAGNOSIS — C3490 Malignant neoplasm of unspecified part of unspecified bronchus or lung: Secondary | ICD-10-CM

## 2017-02-28 DIAGNOSIS — E039 Hypothyroidism, unspecified: Secondary | ICD-10-CM | POA: Diagnosis not present

## 2017-02-28 DIAGNOSIS — Z0000 Encounter for general adult medical examination without abnormal findings: Secondary | ICD-10-CM | POA: Diagnosis not present

## 2017-02-28 NOTE — Patient Instructions (Signed)
  Kevin Ho , Thank you for taking time to come for your Medicare Wellness Visit. I appreciate your ongoing commitment to your health goals. Please review the following plan we discussed and let me know if I can assist you in the future.   These are the goals we discussed: Goals    None      This is a list of the screening recommended for you and due dates:  Health Maintenance  Topic Date Due  . Tetanus Vaccine  08/12/1950  . Flu Shot  Completed  . Pneumonia vaccines  Completed    Keep follow up with DR Stacks on 03/25/17 at 3:10. Schedule a follow up with the oncologist Bring in a copy of the living will. We will do lab work today and have Dr Livia Snellen call you with a report.

## 2017-02-28 NOTE — Progress Notes (Signed)
Subjective:   Kevin Ho is a 84 y.o. male who presents for Medicare Annual/Subsequent preventive examination. He is retired from the [employer]. He enjoys watching TV at home. For exercise, he walks back and forth to the mailbox. He states that he typically eats a soft/ healthy diet. He usually eats a good breakfast and has ensure for some meal replacements. He is not involved in the community or in church. He lives at home alone in a trailer on his sons property. He has 2 living children, one of which lives in [place]. He does have a small dog. Fall risks were discussed today. He states that his health is better today, than it was a year ago. In the last 1.5 years he was diagnosed with cancer and has been through many treatments. He is not fond of coming to the doctor office, but agrees that he needs to be seen more regularly for better health. He states that he hasn't taken any of his Rx medications in 1-2 months, but he is starting to feel bad and weak. He agrees to come in and see Dr Livia Snellen in a few weeks for a full follow up. Labwork will be drawn today, for this upcoming appointment.    Cardiac Risk Factors include: advanced age (>22men, >68 women);male gender;Other (see comment)(cancer )     Objective:    Vitals: BP (!) 164/82   Pulse 69   Temp (!) 97 F (36.1 C) (Oral)   Ht 5' 5.5" (1.664 m)   Wt 128 lb (58.1 kg)   BMI 20.98 kg/m   Body mass index is 20.98 kg/m.  Advanced Directives 02/28/2017 09/19/2016 08/20/2016 07/25/2016 07/02/2016 06/22/2016  Does Patient Have a Medical Advance Directive? Yes No Yes Yes Yes Yes  Type of Advance Directive Living will;Healthcare Power of Attorney - Living will Living will Living will Living will  Does patient want to make changes to medical advance directive? - - - - No - Patient declined No - Patient declined  Copy of [place] in Chart? No - copy requested - - - - -    Tobacco Social History   Tobacco Use  Smoking  Status Current Every Day Smoker  . Packs/day: 1.00  . Years: 72.00  . Pack years: 72.00  . Types: Cigarettes  . Start date: 01/16/1943  Smokeless Tobacco Never Used  Tobacco Comment   -started age 86, currenlty smoking 1 ppd     Ready to quit: Not Answered Counseling given: Not Answered Comment: -started age 53, currenlty smoking 1 ppd   Clinical Intake:                       Past Medical History:  Diagnosis Date  . Alcoholism ([place])   . Anxiety   . Arthritis    neck and lumbar spine per pt  . Cancer ([place])    In his neck area  . Chronic pain   . Dementia   . Glaucoma    per pt  . Headache   . History of radiation therapy 10/15/16-11/28/16   left lung 64 Gy in 32 fractions  . Hyperlipidemia   . Hypertension   . Hypothyroidism   . Insomnia   . Lung cancer ([place])    left lung  . Varicose veins    Past Surgical History:  Procedure Laterality Date  . ANKLE SURGERY Right 1997  . EYE SURGERY Bilateral    cataracts  . FUDUCIAL  PLACEMENT Left 07/25/2016   Procedure: PLACEMENT OF FUDUCIAL;  Surgeon: Collene Gobble, MD;  Location: [facility];  Service: Thoracic;  Laterality: Left;  . porta cath     removed in 2012  . right inguinal hernia    . VIDEO BRONCHOSCOPY WITH ENDOBRONCHIAL NAVIGATION N/A 07/25/2016   Procedure: VIDEO BRONCHOSCOPY WITH ENDOBRONCHIAL NAVIGATION;  Surgeon: Collene Gobble, MD;  Location: [facility];  Service: Thoracic;  Laterality: N/A;  . VIDEO BRONCHOSCOPY WITH ENDOBRONCHIAL ULTRASOUND N/A 07/25/2016   Procedure: VIDEO BRONCHOSCOPY WITH ENDOBRONCHIAL ULTRASOUND;  Surgeon: Collene Gobble, MD;  Location: [facility];  Service: Thoracic;  Laterality: N/A;   Family History  Problem Relation Age of Onset  . Diabetes Son   . Hypertension Mother   . Hypertension Father   . Stroke Father   . Rheum arthritis Daughter   . Drug abuse Daughter   . Diabetes Sister   . Heart attack Daughter   . Colon cancer Neg Hx    Social History   Socioeconomic History    . Marital status: Single    Spouse name: None  . Number of children: 3  . Years of education: None  . Highest education level: None  Social Needs  . Financial resource strain: None  . Food insecurity - worry: None  . Food insecurity - inability: None  . Transportation needs - medical: None  . Transportation needs - non-medical: None  Occupational History  . Occupation: retired    Comment: [employer]   Tobacco Use  . Smoking status: Current Every Day Smoker    Packs/day: 1.00    Years: 72.00    Pack years: 72.00    Types: Cigarettes    Start date: 01/16/1943  . Smokeless tobacco: Never Used  . Tobacco comment: -started age 22, currenlty smoking 1 ppd  Substance and Sexual Activity  . Alcohol use: No    Alcohol/week: 0.0 oz  . Drug use: No  . Sexual activity: No  Other Topics Concern  . None  Social History Narrative  . None    Outpatient Encounter Medications as of 02/28/2017  Medication Sig  . donepezil (ARICEPT) 5 MG tablet TAKE 1 TABLET BY MOUTH ONCE DAILY AT BEDTIME (Patient not taking: Reported on 02/28/2017)  . levothyroxine (SYNTHROID, LEVOTHROID) 75 MCG tablet Take 1 tablet (75 mcg total) by mouth daily before breakfast. (Patient not taking: Reported on 02/28/2017)  . [DISCONTINUED] hydrocortisone cream 1 % Apply 1 application topically 3 (three) times daily as needed (for itchy/irritated skin.).   . [DISCONTINUED] Menthol-Methyl Salicylate (SALONPAS JET SPRAY EX) Apply 1 application topically 3 (three) times daily as needed (for pain.).  . [DISCONTINUED] ondansetron (ZOFRAN) 8 MG tablet Take 1 tablet (8 mg total) by mouth every 8 (eight) hours as needed for nausea or vomiting. (Patient not taking: Reported on 09/19/2016)  . [DISCONTINUED] Wound Dressings (SONAFINE EX) Apply 1 application topically 2 (two) times daily.   Facility-Administered Encounter Medications as of 02/28/2017  Medication  . SONAFINE emulsion 1 application    Activities of Daily Living In your present  state of health, do you have any difficulty performing the following activities: 02/28/2017 07/20/2016  Hearing? N N  Vision? N -  Difficulty concentrating or making decisions? Y N  Comment some dementia -  Walking or climbing stairs? Y N  Comment back pain  -  Dressing or bathing? N Y  Doing errands, shopping? N -  Preparing Food and eating ? N -  Using the Toilet?  N -  In the past six months, have you accidently leaked urine? N -  Do you have problems with loss of bowel control? N -  Managing your Medications? N -  Managing your Finances? N -  Housekeeping or managing your Housekeeping? N -  Some recent data might be hidden    Patient Care Team: [name], MD as PCP - General (Family Medicine) Sharion Balloon, FNP as Nurse Practitioner (Family Medicine) Gery Pray, MD as Consulting Physician (Radiation Oncology)   Assessment:   This is a routine wellness examination for B[place].  Exercise Activities and Dietary recommendations Current Exercise Habits: Home exercise routine, Type of exercise: walking, Time (Minutes): 15, Frequency (Times/Week): 7, Weekly Exercise (Minutes/Week): 105, Intensity: Mild, Exercise limited by: orthopedic condition(s)  Goals    None      Fall Risk Fall Risk  02/28/2017 09/19/2016 05/25/2016 04/11/2015 12/08/2014  Falls in the past year? No No No No No   Is the patient's home free of loose throw rugs in walkways, pet beds, electrical cords, etc? Fall risk and hazards were discussed today.   Depression Screen PHQ 2/9 Scores 02/28/2017 09/19/2016 05/25/2016 04/11/2015  PHQ - 2 Score 1 2 0 1  PHQ- 9 Score - 9 - -    Cognitive Function MMSE - Mini Mental State Exam 02/28/2017 04/11/2015 04/11/2015  Orientation to time 5 2 2   Orientation to Place 4 5 5   Registration 3 - 3  Attention/ Calculation 5 - 1  Recall 3 - 3  Language- name 2 objects 2 - 2  Language- repeat 1 - 1  Language- follow 3 step command 3 - 2  Language- read & follow direction 1 -  1  Write a sentence 1 - 1  Copy design 1 - 1  Total score 29 - 22        Immunization History  Administered Date(s) Administered  . Influenza, High Dose Seasonal PF 10/15/2012, 11/03/2016  . Influenza-Unspecified 09/25/2015  . Pneumococcal Conjugate-13 03/15/2014  . Pneumococcal Polysaccharide-23 05/25/2016    Qualifies for Shingles Vaccine? pt declined  Screening Tests Health Maintenance  Topic Date Due  . TETANUS/TDAP  08/12/1950  . INFLUENZA VACCINE  Completed  . PNA vac Low Risk Adult  Completed   Cancer Screenings: Lung: Low Dose CT Chest recommended if Age 76-80 years, 30 pack-year currently smoking OR have quit w/in 15years. Patient does qualify. Colorectal: may need (appt 03/25/17)  Additional Screenings:  Hepatitis B/HIV/Syphillis: Hepatitis C Screening:    declined  Plan:   pt is to keep follow up appt made with Dr Livia Snellen on 03/25/17.  He is to bring in a copy of his advanced directives.  We drew labs today - he has been out of medication for a few months.  He will schedule a overdue follow up with his oncologist.    I have personally reviewed and noted the following in the patient's chart:   . Medical and social history . Use of alcohol, tobacco or illicit drugs  . Current medications and supplements . Functional ability and status . Nutritional status . Physical activity . Advanced directives . List of other physicians . Hospitalizations, surgeries, and ER visits in previous 12 months . Vitals . Screenings to include cognitive, depression, and falls . Referrals and appointments  In addition, I have reviewed and discussed with patient certain preventive protocols, quality metrics, and best practice recommendations. A written personalized care plan for preventive services as well as general preventive health recommendations  were provided to patient.     Dontarious Schaum, Cameron Proud, LPN  04/05/2246  I have reviewed and agree with the above AWV  documentation.   Assunta Found, MD [facility] 02/28/2017, 8:29 PM

## 2017-03-04 LAB — THYROID PANEL WITH TSH
Free Thyroxine Index: 1.2 (ref 1.2–4.9)
T3 Uptake Ratio: 25 % (ref 24–39)
T4, Total: 4.7 ug/dL (ref 4.5–12.0)
TSH: 20.81 u[IU]/mL — AB (ref 0.450–4.500)

## 2017-03-04 LAB — CMP14+EGFR
ALBUMIN: 5 g/dL — AB (ref 3.5–4.7)
ALT: 15 IU/L (ref 0–44)
AST: 29 IU/L (ref 0–40)
Albumin/Globulin Ratio: 1.4 (ref 1.2–2.2)
Alkaline Phosphatase: 107 IU/L (ref 39–117)
BUN / CREAT RATIO: 16 (ref 10–24)
BUN: 20 mg/dL (ref 8–27)
CALCIUM: 10.3 mg/dL — AB (ref 8.6–10.2)
CHLORIDE: 104 mmol/L (ref 96–106)
CO2: 19 mmol/L — ABNORMAL LOW (ref 20–29)
Creatinine, Ser: 1.27 mg/dL (ref 0.76–1.27)
GFR, EST AFRICAN AMERICAN: 60 mL/min/{1.73_m2} (ref >59)
GFR, EST NON AFRICAN AMERICAN: 52 mL/min/{1.73_m2} — AB (ref >59)
Globulin, Total: 3.5 g/dL (ref 1.5–4.5)
Glucose: 107 mg/dL — ABNORMAL HIGH (ref 65–99)
Potassium: 6.3 mmol/L (ref 3.5–5.2)
Sodium: 144 mmol/L (ref 134–144)
TOTAL PROTEIN: 8.5 g/dL (ref 6.0–8.5)

## 2017-03-04 LAB — CBC WITH DIFFERENTIAL/PLATELET

## 2017-03-04 LAB — LIPID PANEL
CHOL/HDL RATIO: 3.9 ratio (ref 0.0–5.0)
Cholesterol, Total: 232 mg/dL — ABNORMAL HIGH (ref 100–199)
HDL: 59 mg/dL (ref >39)
LDL Calculated: 115 mg/dL — ABNORMAL HIGH (ref 0–99)
Triglycerides: 290 mg/dL — ABNORMAL HIGH (ref 0–149)
VLDL CHOLESTEROL CAL: 58 mg/dL — AB (ref 5–40)

## 2017-03-11 ENCOUNTER — Ambulatory Visit (INDEPENDENT_AMBULATORY_CARE_PROVIDER_SITE_OTHER): Payer: Medicare HMO | Admitting: Family Medicine

## 2017-03-11 ENCOUNTER — Encounter: Payer: Self-pay | Admitting: Family Medicine

## 2017-03-11 ENCOUNTER — Ambulatory Visit (INDEPENDENT_AMBULATORY_CARE_PROVIDER_SITE_OTHER): Payer: Medicare HMO

## 2017-03-11 VITALS — BP 171/91 | HR 71 | Temp 96.6°F | Ht 65.5 in | Wt 130.0 lb

## 2017-03-11 DIAGNOSIS — E038 Other specified hypothyroidism: Secondary | ICD-10-CM

## 2017-03-11 DIAGNOSIS — C3412 Malignant neoplasm of upper lobe, left bronchus or lung: Secondary | ICD-10-CM

## 2017-03-11 DIAGNOSIS — R0781 Pleurodynia: Secondary | ICD-10-CM

## 2017-03-11 DIAGNOSIS — G301 Alzheimer's disease with late onset: Secondary | ICD-10-CM | POA: Diagnosis not present

## 2017-03-11 DIAGNOSIS — S2232XA Fracture of one rib, left side, initial encounter for closed fracture: Secondary | ICD-10-CM | POA: Diagnosis not present

## 2017-03-11 DIAGNOSIS — F0281 Dementia in other diseases classified elsewhere with behavioral disturbance: Secondary | ICD-10-CM | POA: Diagnosis not present

## 2017-03-11 MED ORDER — DONEPEZIL HCL 5 MG PO TABS: 5.0000 mg | ORAL_TABLET | Freq: Every day | ORAL | 1 refills | Status: DC

## 2017-03-11 MED ORDER — LEVOTHYROXINE SODIUM 75 MCG PO TABS: 75.0000 ug | ORAL_TABLET | Freq: Every day | ORAL | 1 refills | Status: DC

## 2017-03-11 NOTE — Progress Notes (Signed)
Subjective:  Patient ID: Kevin Ho, male    DOB: November 07, 1931  Age: 84 y.o. MRN: [id number]  CC: Hypothyroidism (pt here today for routine follow up of his chronic medical conditions and he has stopped taking his thyroid medicine and Aricept)  Much of today's history is obtained from his daughter-in-law. HPI Kevin Ho presents for patient presents for follow-up on  thyroid. The patient has a history of hypothyroidism for many years. Pt. denies any change in  voice, loss of hair, heat or cold intolerance. Energy level has been adequate. Patient denies constipation and diarrhea. No myxedema. Medication is as noted below.  Patient discontinued taking this an unknown amount of time ago - Possibly several months.  He is now under the care of his son and daughter-in-law.  She gives much of the history today.  She says he has problems with processing information.  He can be very repetitive.  He does not understand conceptual information.  He can process only very straightforward lists.  He does not understand the concept of dementia in particular but asks multiple times what is dementia.  He has been prescribed donepezil 5 mg in the past but has discontinued taking that medicine.  Daughter-in-law says that she will be supervising this medicine for him as well.  Patient is also been under the care of oncology for lung cancer.  He is between treatments but going back for evaluation in the next few days to weeks.  This will be to determine if treatment is been effective and/or if further treatment is necessary.  His prognosis is unclear.  However, he tells me he continues to smoke.  He has been smoking since he was 84 years old at least a pack a day and has no intention of quitting now.  His and his daughter-in-law's consensus is that the damage has been done.  He has lost a good bit of weight during treatment and at this time has been placed on supplements with Ensure and boost.  He is drinking those  3-4 times a day.  Patient lean forward and fell on the stairs a couple of weeks ago.  He thinks he may have cracked a rib on the left side. Depression screen [facility] 2/9 03/11/2017 02/28/2017 09/19/2016  Decreased Interest 0 0 1  Down, Depressed, Hopeless 1 1 1   PHQ - 2 Score 1 1 2   Altered sleeping - - 3  Tired, decreased energy - - 3  Change in appetite - - 0  Feeling bad or failure about yourself  - - 1  Trouble concentrating - - 0  Moving slowly or fidgety/restless - - 0  Suicidal thoughts - - 0  PHQ-9 Score - - 9    History Kevin Ho has a past medical history of Alcoholism ([place]), Anxiety, Arthritis, Cancer ([place]), Chronic pain, Dementia, Glaucoma, Headache, History of radiation therapy (10/15/16-11/28/16), Hyperlipidemia, Hypertension, Hypothyroidism, Insomnia, Lung cancer ([place]), and Varicose veins.   He has a past surgical history that includes right inguinal hernia; porta cath; Eye surgery (Bilateral); Video bronchoscopy with endobronchial navigation (N/A, 07/25/2016); Video bronchoscopy with endobronchial ultrasound (N/A, 07/25/2016); Fuducial placement (Left, 07/25/2016); and Ankle surgery (Right, 1997).   His family history includes Diabetes in his sister and son; Drug abuse in his daughter; Heart attack in his daughter; Hypertension in his father and mother; Rheum arthritis in his daughter; Stroke in his father.He reports that he has been smoking cigarettes.  He started smoking about 74 years ago. He has a  72.00 pack-year smoking history. he has never used smokeless tobacco. He reports that he does not drink alcohol or use drugs.    ROS Review of Systems  Unable to perform ROS: Dementia    Objective:  BP (!) 171/91   Pulse 71   Temp (!) 96.6 F (35.9 C) (Oral)   Ht 5' 5.5" (1.664 m)   Wt 130 lb (59 kg)   BMI 21.30 kg/m    BP Readings from Last 3 Encounters:  03/11/17 (!) 171/91  02/28/17 (!) 164/82  09/19/16 (!) 159/81    Wt Readings from Last 3 Encounters:  03/11/17 130  lb (59 kg)  02/28/17 128 lb (58.1 kg)  09/19/16 131 lb 3.2 oz (59.5 kg)     Physical Exam  Constitutional: He is oriented to person, place, and time. He appears well-developed and well-nourished. No distress.  HENT:  Head: Normocephalic and atraumatic.  Right Ear: External ear normal.  Left Ear: External ear normal.  Nose: Nose normal.  Mouth/Throat: Oropharynx is clear and moist.  Eyes: Conjunctivae and EOM are normal. Pupils are equal, round, and reactive to light.  Neck: Normal range of motion. Neck supple. No thyromegaly present.  Cardiovascular: Normal rate, regular rhythm and normal heart sounds.  No murmur heard. Pulmonary/Chest: Effort normal and breath sounds normal. No respiratory distress. He has no wheezes. He has no rales.  Abdominal: Soft. Bowel sounds are normal. He exhibits no distension. There is no tenderness.  Musculoskeletal:       Lumbar back: He exhibits decreased range of motion and tenderness. He exhibits no edema and no deformity.       Back:  Left mid axillary line pain/tenderness at rib 10.  Lymphadenopathy:    He has no cervical adenopathy.  Neurological: He is alert and oriented to person, place, and time. He has normal reflexes.  Skin: Skin is warm and dry.  Psychiatric: His behavior is normal. Thought content normal. His mood appears anxious. His affect is labile. His speech is slurred. Cognition and memory are impaired. He expresses impulsivity and inappropriate judgment. He exhibits abnormal recent memory.    X-ray confirms nondisplaced fracture of the left 10th rib  Assessment & Plan:   Kevin Ho was seen today for hypothyroidism.  Diagnoses and all orders for this visit:  Rib pain on left side -     DG Ribs Unilateral W/Chest Left; Future  Other specified hypothyroidism -     CBC with Differential/Platelet -     CMP14+EGFR -     TSH -     T4, Free  Other orders -     Discontinue: donepezil (ARICEPT) 5 MG tablet; Take 1 tablet (5 mg  total) by mouth at bedtime. -     Discontinue: levothyroxine (SYNTHROID, LEVOTHROID) 75 MCG tablet; Take 1 tablet (75 mcg total) by mouth daily before breakfast. -     donepezil (ARICEPT) 5 MG tablet; Take 1 tablet (5 mg total) by mouth at bedtime. -     levothyroxine (SYNTHROID, LEVOTHROID) 75 MCG tablet; Take 1 tablet (75 mcg total) by mouth daily before breakfast.       I am having Marquita Palms. Ohlin maintain his donepezil and levothyroxine.  Allergies as of 03/11/2017      Reactions   Chantix [varenicline] Other (See Comments)   Sleep walking       Medication List        Accurate as of 03/11/17  5:49 PM. Always use your most recent med list.  donepezil 5 MG tablet Commonly known as:  ARICEPT Take 1 tablet (5 mg total) by mouth at bedtime.   levothyroxine 75 MCG tablet Commonly known as:  SYNTHROID, LEVOTHROID Take 1 tablet (75 mcg total) by mouth daily before breakfast.      Patient is at a high risk for falls.  Great deal of time was spent discussing various aspects of his case.  Over 40 minutes was spent with the patient.  Of this more than half was spent discussing coping for abnormal balance, nutrition for loss of weight, dementia and its various facets as well as coping skills for the patient and caregiver.  I recommended that his caregivers read The 36-Hour Day.    Additionally we discussed his thyroid.  Patient does need to take his medication but specific recommendations need to wait until we get the thyroid blood test back.  His daughter-in-law and son will be supervising his medication as mentioned above and we discussed his need to take those on a regular basis.  Labs pending at this time.  Follow-up: No Follow-up on file.  [name], M.D.

## 2017-03-12 ENCOUNTER — Other Ambulatory Visit: Payer: Self-pay | Admitting: Family Medicine

## 2017-03-12 LAB — CBC WITH DIFFERENTIAL/PLATELET
BASOS ABS: 0.1 10*3/uL (ref 0.0–0.2)
Basos: 1 %
EOS (ABSOLUTE): 0.5 10*3/uL — ABNORMAL HIGH (ref 0.0–0.4)
Eos: 6 %
Hematocrit: 42.7 % (ref 37.5–51.0)
Hemoglobin: 14.8 g/dL (ref 13.0–17.7)
Immature Grans (Abs): 0 10*3/uL (ref 0.0–0.1)
Immature Granulocytes: 0 %
LYMPHS ABS: 1.2 10*3/uL (ref 0.7–3.1)
Lymphs: 14 %
MCH: 32.7 pg (ref 26.6–33.0)
MCHC: 34.7 g/dL (ref 31.5–35.7)
MCV: 94 fL (ref 79–97)
MONOS ABS: 1.2 10*3/uL — AB (ref 0.1–0.9)
Monocytes: 14 %
Neutrophils Absolute: 5.5 10*3/uL (ref 1.4–7.0)
Neutrophils: 65 %
PLATELETS: 247 10*3/uL (ref 150–379)
RBC: 4.53 x10E6/uL (ref 4.14–5.80)
RDW: 13.5 % (ref 12.3–15.4)
WBC: 8.5 10*3/uL (ref 3.4–10.8)

## 2017-03-12 LAB — CMP14+EGFR
ALK PHOS: 107 IU/L (ref 39–117)
ALT: 13 IU/L (ref 0–44)
AST: 18 IU/L (ref 0–40)
Albumin/Globulin Ratio: 1.3 (ref 1.2–2.2)
Albumin: 4.8 g/dL — ABNORMAL HIGH (ref 3.5–4.7)
BUN/Creatinine Ratio: 21 (ref 10–24)
BUN: 24 mg/dL (ref 8–27)
Bilirubin Total: 0.3 mg/dL (ref 0.0–1.2)
CALCIUM: 10.5 mg/dL — AB (ref 8.6–10.2)
CO2: 24 mmol/L (ref 20–29)
CREATININE: 1.14 mg/dL (ref 0.76–1.27)
Chloride: 99 mmol/L (ref 96–106)
GFR calc Af Amer: 68 mL/min/{1.73_m2} (ref >59)
GFR calc non Af Amer: 59 mL/min/{1.73_m2} — ABNORMAL LOW (ref >59)
GLUCOSE: 110 mg/dL — AB (ref 65–99)
Globulin, Total: 3.7 g/dL (ref 1.5–4.5)
Potassium: 5.7 mmol/L — ABNORMAL HIGH (ref 3.5–5.2)
SODIUM: 139 mmol/L (ref 134–144)
Total Protein: 8.5 g/dL (ref 6.0–8.5)

## 2017-03-12 LAB — TSH: TSH: 27.63 u[IU]/mL — AB (ref 0.450–4.500)

## 2017-03-12 LAB — T4, FREE: FREE T4: 0.77 ng/dL — AB (ref 0.82–1.77)

## 2017-03-12 MED ORDER — LEVOTHYROXINE SODIUM 25 MCG PO TABS: ORAL_TABLET | ORAL | 0 refills | Status: DC

## 2017-03-25 ENCOUNTER — Ambulatory Visit: Payer: Medicare HMO | Admitting: Family Medicine

## 2017-04-08 ENCOUNTER — Encounter: Payer: Self-pay | Admitting: Family Medicine

## 2017-04-08 ENCOUNTER — Ambulatory Visit (INDEPENDENT_AMBULATORY_CARE_PROVIDER_SITE_OTHER): Payer: Medicare HMO | Admitting: Family Medicine

## 2017-04-08 VITALS — BP 167/84 | HR 58 | Temp 96.2°F | Ht 65.5 in | Wt 131.1 lb

## 2017-04-08 DIAGNOSIS — F0281 Dementia in other diseases classified elsewhere with behavioral disturbance: Secondary | ICD-10-CM

## 2017-04-08 DIAGNOSIS — I10 Essential (primary) hypertension: Secondary | ICD-10-CM

## 2017-04-08 DIAGNOSIS — G301 Alzheimer's disease with late onset: Secondary | ICD-10-CM

## 2017-04-08 DIAGNOSIS — E038 Other specified hypothyroidism: Secondary | ICD-10-CM

## 2017-04-08 DIAGNOSIS — F02818 Dementia in other diseases classified elsewhere, unspecified severity, with other behavioral disturbance: Secondary | ICD-10-CM

## 2017-04-08 MED ORDER — LISINOPRIL 10 MG PO TABS: 10.0000 mg | ORAL_TABLET | Freq: Every day | ORAL | 3 refills | Status: DC

## 2017-04-08 MED ORDER — DONEPEZIL HCL 10 MG PO TABS: 10.0000 mg | ORAL_TABLET | Freq: Every day | ORAL | 5 refills | Status: DC

## 2017-04-08 NOTE — Progress Notes (Signed)
Subjective:  Patient ID: Kevin Ho, male    DOB: 1931-01-26  Age: 84 y.o. MRN: [id number]  CC: Follow-up (pt here today for 1 month follow up after starting back on his Aricept and Levothyroxine)   HPI Kevin Ho presents for recheck on his thyroid condition.  He is taking his medicine regularly now under supervision of his daughter-in-law and son.  He is having some dizziness.  Daughter-in-law confirms that he is a bit off balance.  He has a walker.  They would rather have a quad cane.  Patient says that he can walk when he wants to its just when he bends over he feels more dizzy.  However daughter-in-law has observed that he does get unsteady with walking.  Patient states that he has problems with his memory.  He just cannot recall things as well as he used to.  On the other hand, he says that he does not necessarily want to remember everything.  Daughter-in-law confirms that he is repetitive at times.  Patient demonstrates that during the office visit with repetitive history.  It does not seem to be worsening at the time, however it has not as yet improved with reinstitution of medication.  Depression screen [facility] 2/9 03/11/2017 02/28/2017 09/19/2016  Decreased Interest 0 0 1  Down, Depressed, Hopeless 1 1 1   PHQ - 2 Score 1 1 2   Altered sleeping - - 3  Tired, decreased energy - - 3  Change in appetite - - 0  Feeling bad or failure about yourself  - - 1  Trouble concentrating - - 0  Moving slowly or fidgety/restless - - 0  Suicidal thoughts - - 0  PHQ-9 Score - - 9    History Kevin Ho has a past medical history of Alcoholism (Kevin Ho), Anxiety, Arthritis, Cancer ([place]), Chronic pain, Dementia, Glaucoma, Headache, History of radiation therapy (10/15/16-11/28/16), Hyperlipidemia, Hypertension, Hypothyroidism, Insomnia, Lung cancer ([place]), and Varicose veins.   He has a past surgical history that includes right inguinal hernia; porta cath; Eye surgery (Bilateral); Video bronchoscopy with  endobronchial navigation (N/A, 07/25/2016); Video bronchoscopy with endobronchial ultrasound (N/A, 07/25/2016); Fuducial placement (Left, 07/25/2016); and Ankle surgery (Right, 1997).   His family history includes Diabetes in his sister and son; Drug abuse in his daughter; Heart attack in his daughter; Hypertension in his father and mother; Rheum arthritis in his daughter; Stroke in his father.He reports that he has been smoking cigarettes.  He started smoking about 74 years ago. He has a 72.00 pack-year smoking history. He has never used smokeless tobacco. He reports that he does not drink alcohol or use drugs.    ROS Review of Systems  Constitutional: Negative for chills, diaphoresis, fever and unexpected weight change.  HENT: Negative for congestion, hearing loss, rhinorrhea and sore throat.   Eyes: Negative for visual disturbance.  Respiratory: Negative for cough and shortness of breath.   Cardiovascular: Negative for chest pain.  Gastrointestinal: Negative for abdominal pain, constipation and diarrhea.  Genitourinary: Negative for dysuria and flank pain.  Musculoskeletal: Negative for arthralgias and joint swelling.  Skin: Negative for rash.  Neurological: Negative for dizziness and headaches.  Psychiatric/Behavioral: Negative for dysphoric mood and sleep disturbance.    Objective:  BP (!) 167/84   Pulse (!) 58   Temp (!) 96.2 F (35.7 C) (Oral)   Ht 5' 5.5" (1.664 m)   Wt 131 lb 2 oz (59.5 kg)   BMI 21.49 kg/m   BP Readings from Last 3 Encounters:  04/08/17 (!) 167/84  03/11/17 (!) 171/91  02/28/17 (!) 164/82    Wt Readings from Last 3 Encounters:  04/08/17 131 lb 2 oz (59.5 kg)  03/11/17 130 lb (59 kg)  02/28/17 128 lb (58.1 kg)     Physical Exam  Constitutional: He is oriented to person, place, and time. He appears well-developed and well-nourished. No distress.  HENT:  Head: Normocephalic and atraumatic.  Right Ear: External ear normal.  Left Ear: External ear  normal.  Nose: Nose normal.  Mouth/Throat: Oropharynx is clear and moist.  Eyes: Pupils are equal, round, and reactive to light. Conjunctivae and EOM are normal.  Neck: Normal range of motion. Neck supple. No thyromegaly present.  Cardiovascular: Normal rate, regular rhythm and normal heart sounds.  No murmur heard. Pulmonary/Chest: Effort normal and breath sounds normal. No respiratory distress. He has no wheezes. He has no rales.  Abdominal: Soft. Bowel sounds are normal. He exhibits no distension. There is no tenderness.  Lymphadenopathy:    He has no cervical adenopathy.  Neurological: He is alert and oriented to person, place, and time. He has normal reflexes.  Skin: Skin is warm and dry.  Psychiatric: He has a normal mood and affect. His behavior is normal.      Assessment & Plan:   Kevin Ho was seen today for follow-up.  Diagnoses and all orders for this visit:  Other specified hypothyroidism -     TSH -     T4, Free  Late onset Alzheimer's disease with behavioral disturbance  Essential hypertension  Other orders -     donepezil (ARICEPT) 10 MG tablet; Take 1 tablet (10 mg total) by mouth at bedtime. -     lisinopril (PRINIVIL,ZESTRIL) 10 MG tablet; Take 1 tablet (10 mg total) by mouth daily.       I have discontinued Kevin Ho. Kevin Ho donepezil. I am also having him start on donepezil and lisinopril. Additionally, I am having him maintain his levothyroxine.  Allergies as of 04/08/2017      Reactions   Chantix [varenicline] Other (See Comments)   Sleep walking       Medication List        Accurate as of 04/08/17  8:57 AM. Always use your most recent med list.          donepezil 10 MG tablet Commonly known as:  ARICEPT Take 1 tablet (10 mg total) by mouth at bedtime.   levothyroxine 75 MCG tablet Commonly known as:  SYNTHROID, LEVOTHROID Take 75 mcg by mouth daily.   lisinopril 10 MG tablet Commonly known as:  PRINIVIL,ZESTRIL Take 1 tablet (10  mg total) by mouth daily.      As per usual we discussed smoking cessation but that is not something he is willing to 2 at this time.  For his memory I went ahead and increased his donepezil.  He also will check his thyroid to see if we need to adjust that dosage.  For the blood pressure from added lisinopril today.  Follow-up: Return in about 6 weeks (around 05/20/2017).  [name], M.D.

## 2017-04-09 LAB — T4, FREE: Free T4: 1.28 ng/dL (ref 0.82–1.77)

## 2017-04-09 LAB — TSH: TSH: 6.82 u[IU]/mL — AB (ref 0.450–4.500)

## 2017-04-10 ENCOUNTER — Other Ambulatory Visit: Payer: Self-pay | Admitting: Family Medicine

## 2017-04-10 MED ORDER — LEVOTHYROXINE SODIUM 88 MCG PO TABS: 88.0000 ug | ORAL_TABLET | Freq: Every day | ORAL | 2 refills | Status: DC

## 2017-04-11 ENCOUNTER — Other Ambulatory Visit: Payer: Self-pay | Admitting: *Deleted

## 2017-04-11 DIAGNOSIS — E038 Other specified hypothyroidism: Secondary | ICD-10-CM

## 2017-04-22 ENCOUNTER — Ambulatory Visit
Admission: RE | Admit: 2017-04-22 | Discharge: 2017-04-22 | Disposition: A | Discharge status: Home / Self Care | Payer: Medicare HMO | Source: Ambulatory Visit | Attending: Radiation Oncology | Admitting: Radiation Oncology

## 2017-04-22 ENCOUNTER — Other Ambulatory Visit: Payer: Self-pay

## 2017-04-22 ENCOUNTER — Encounter: Payer: Self-pay | Admitting: Radiation Oncology

## 2017-04-22 VITALS — BP 143/70 | HR 66 | Temp 97.0°F | Resp 18 | Wt 128.1 lb

## 2017-04-22 DIAGNOSIS — J449 Chronic obstructive pulmonary disease, unspecified: Secondary | ICD-10-CM | POA: Insufficient documentation

## 2017-04-22 DIAGNOSIS — R630 Anorexia: Secondary | ICD-10-CM | POA: Insufficient documentation

## 2017-04-22 DIAGNOSIS — Z79899 Other long term (current) drug therapy: Secondary | ICD-10-CM | POA: Diagnosis not present

## 2017-04-22 DIAGNOSIS — C3412 Malignant neoplasm of upper lobe, left bronchus or lung: Secondary | ICD-10-CM | POA: Diagnosis not present

## 2017-04-22 DIAGNOSIS — R1310 Dysphagia, unspecified: Secondary | ICD-10-CM | POA: Diagnosis not present

## 2017-04-22 DIAGNOSIS — R05 Cough: Secondary | ICD-10-CM | POA: Diagnosis not present

## 2017-04-22 DIAGNOSIS — Z923 Personal history of irradiation: Secondary | ICD-10-CM | POA: Insufficient documentation

## 2017-04-22 DIAGNOSIS — Z08 Encounter for follow-up examination after completed treatment for malignant neoplasm: Secondary | ICD-10-CM | POA: Diagnosis not present

## 2017-04-22 DIAGNOSIS — M549 Dorsalgia, unspecified: Secondary | ICD-10-CM | POA: Insufficient documentation

## 2017-04-22 NOTE — Progress Notes (Signed)
Kevin Ho is here for his follow-up appointment. States that he is having pain all over. States that he has mild fatigue. States that he gets shortness of breath when he is at rest. States when he ambulates he breathes better. States that he has difficulty with swallowing. States that he coughs up yellow phlegm. States that his appetite is not good due to swallowing. States that he drinks a lot of supplements.Denies  any skin irration . Vitals:   04/22/17 1637  BP: (!) 143/70  Pulse: 66  Resp: 18  Temp: (!) 97 F (36.1 C)  TempSrc: Oral  SpO2: 97%  Weight: 128 lb 2 oz (58.1 kg)   Wt Readings from Last 3 Encounters:  04/22/17 128 lb 2 oz (58.1 kg)  04/08/17 131 lb 2 oz (59.5 kg)  03/11/17 130 lb (59 kg)

## 2017-04-22 NOTE — Progress Notes (Signed)
Radiation Oncology         [phone] ________________________________  Name: Kevin Ho MRN: [id number]  Date: 04/22/2017  DOB: Nov 01, 1931  Follow-Up Visit Note  CC: [name], MD  [name], MD    ICD-10-CM   1. Primary cancer of left upper lobe of lung (HCC) C34.12     Diagnosis:  Stage III non-small cell lung cancer,favoring adenocarcinoma  Interval Since Last Radiation:  5 months 10/15/16-11/28/16: 64 Gy to the left lung in 32 fractions  Narrative:  The patient returns today for routine follow-up. The patient reports generalized pain throughout his body, worse in the mid and low back. He reports this pain is unchanged since previous visit. He complains of chronic dizziness. He notes mild fatigue. He also complains of shortness of breath at rest that improves while ambulating. He reports a productive cough with yellow phlegm. He notes difficulty swallowing and reports a poor appetite as a result. He notes drinking a lot of supplements. He denies skin irritation.      The patient underwent rib and chest scan after a fall on 03/11/17. This revealed acute slightly displaced fracture of the left lateral tenth rib. No other acute findings. No pleural effusion or pneumothorax.                           ALLERGIES:  is allergic to chantix [varenicline].  Meds: Current Outpatient Medications  Medication Sig Dispense Refill  . donepezil (ARICEPT) 10 MG tablet Take 1 tablet (10 mg total) by mouth at bedtime. 30 tablet 5  . levothyroxine (SYNTHROID, LEVOTHROID) 88 MCG tablet Take 1 tablet (88 mcg total) by mouth daily. 30 tablet 2  . lisinopril (PRINIVIL,ZESTRIL) 10 MG tablet Take 1 tablet (10 mg total) by mouth daily. 90 tablet 3   No current facility-administered medications for this encounter.    Facility-Administered Medications Ordered in Other Encounters  Medication Dose Route Frequency Provider Last Rate Last Dose  . SONAFINE emulsion 1 application  1 application  Topical Once Gery Pray, MD        Physical Findings: The patient is in no acute distress. Patient is alert and oriented.  weight is 128 lb 2 oz (58.1 kg). His oral temperature is 97 F (36.1 C) (abnormal). His blood pressure is 143/70 (abnormal) and his pulse is 66. His respiration is 18 and oxygen saturation is 97%. .  No significant changes. Lungs are clear to auscultation bilaterally. Heart has regular rate and rhythm. No palpable cervical, supraclavicular, or axillary adenopathy. Abdomen soft, non-tender, normal bowel sounds. Varicose veins noted.   Lab Findings: Lab Results  Component Value Date   WBC 8.5 03/11/2017   HGB 14.8 03/11/2017   HCT 42.7 03/11/2017   MCV 94 03/11/2017   PLT 247 03/11/2017    Radiographic Findings: No results found.  Impression:  The patient is recovering from the effects of radiation. No evidence of recurrence on clinical exam. The patient missed several follow-up appointments.  This is his 1st f/u since completion of radiation therapy.  Plan: Follow-up in radiation oncology in 6 months. Chest CT scan will be ordered for next week.   ____________________________________  This document serves as a record of services personally performed by Gery Pray, MD. It was created on his behalf by Bethann Humble, a trained medical scribe. The creation of this record is based on the scribe's personal observations and the provider's statements to them. This document has been checked  and approved by the attending provider.

## 2017-05-05 ENCOUNTER — Other Ambulatory Visit: Payer: Self-pay | Admitting: Family Medicine

## 2017-05-21 ENCOUNTER — Ambulatory Visit: Payer: Medicare HMO | Admitting: Family Medicine

## 2017-06-11 ENCOUNTER — Other Ambulatory Visit: Payer: Medicare HMO

## 2017-06-11 DIAGNOSIS — E038 Other specified hypothyroidism: Secondary | ICD-10-CM

## 2017-06-12 ENCOUNTER — Ambulatory Visit: Payer: Medicare HMO | Admitting: Family Medicine

## 2017-06-12 LAB — T4, FREE: FREE T4: 1.47 ng/dL (ref 0.82–1.77)

## 2017-06-12 LAB — TSH: TSH: 0.598 u[IU]/mL (ref 0.450–4.500)

## 2017-06-14 ENCOUNTER — Encounter: Payer: Self-pay | Admitting: Family Medicine

## 2017-06-24 ENCOUNTER — Ambulatory Visit: Payer: Medicare HMO | Admitting: Family Medicine

## 2017-06-26 ENCOUNTER — Encounter: Payer: Self-pay | Admitting: Family Medicine

## 2017-07-07 ENCOUNTER — Other Ambulatory Visit: Payer: Self-pay | Admitting: Family Medicine

## 2017-07-10 ENCOUNTER — Encounter: Payer: Self-pay | Admitting: Family Medicine

## 2017-07-10 ENCOUNTER — Ambulatory Visit (INDEPENDENT_AMBULATORY_CARE_PROVIDER_SITE_OTHER): Payer: Medicare HMO | Admitting: Family Medicine

## 2017-07-10 VITALS — BP 123/62 | HR 63 | Temp 96.7°F | Ht 65.5 in | Wt 124.0 lb

## 2017-07-10 DIAGNOSIS — C3412 Malignant neoplasm of upper lobe, left bronchus or lung: Secondary | ICD-10-CM | POA: Diagnosis not present

## 2017-07-10 DIAGNOSIS — J449 Chronic obstructive pulmonary disease, unspecified: Secondary | ICD-10-CM

## 2017-07-10 DIAGNOSIS — E038 Other specified hypothyroidism: Secondary | ICD-10-CM

## 2017-07-10 DIAGNOSIS — E785 Hyperlipidemia, unspecified: Secondary | ICD-10-CM

## 2017-07-10 DIAGNOSIS — D649 Anemia, unspecified: Secondary | ICD-10-CM

## 2017-07-10 MED ORDER — FLUOCINONIDE-E 0.05 % EX CREA: 1.0000 "application " | TOPICAL_CREAM | Freq: Two times a day (BID) | CUTANEOUS | 1 refills | Status: DC

## 2017-07-10 MED ORDER — DONEPEZIL HCL 10 MG PO TABS: 10.0000 mg | ORAL_TABLET | Freq: Every day | ORAL | 5 refills | Status: DC

## 2017-07-10 MED ORDER — MECLIZINE HCL 25 MG PO TABS: 25.0000 mg | ORAL_TABLET | Freq: Three times a day (TID) | ORAL | 2 refills | Status: DC | PRN

## 2017-07-10 NOTE — Progress Notes (Signed)
Subjective:  Patient ID: Kevin Ho, male    DOB: Nov 28, 1931  Age: 84 y.o. MRN: [id number]  CC: Medical Management of Chronic Issues   HPI Kevin Ho presents for I missed read thanks you to pretty avoidGrade patient presents for follow-up on  thyroid. The patient has a history of hypothyroidism for many years. It has been stable recently. Pt. denies any change in  voice, loss of hair, heat or cold intolerance. Energy level has been adequate to good. Patient denies constipation and diarrhea. No myxedema. Medication is as noted below. Verified that pt is taking it daily on an empty stomach. Well tolerated.  Patient also has discontinued chemo and radiation due to intolerance.  He is due for follow-up with his oncologist.  He wants to set that up himself.  He is having some dizziness after taking his medicine in the morning.  He is taking the thyroid after breakfast rather than fasting.  Depression screen [facility] 2/9 03/11/2017 02/28/2017 09/19/2016  Decreased Interest 0 0 1  Down, Depressed, Hopeless 1 1 1   PHQ - 2 Score 1 1 2   Altered sleeping - - 3  Tired, decreased energy - - 3  Change in appetite - - 0  Feeling bad or failure about yourself  - - 1  Trouble concentrating - - 0  Moving slowly or fidgety/restless - - 0  Suicidal thoughts - - 0  PHQ-9 Score - - 9    History Kevin Ho has a past medical history of Alcoholism ([place]), Anxiety, Arthritis, Cancer ([place]), Chronic pain, Dementia, Glaucoma, Headache, History of radiation therapy (10/15/16-11/28/16), Hyperlipidemia, Hypertension, Hypothyroidism, Insomnia, Lung cancer ([place]), and Varicose veins.   He has a past surgical history that includes right inguinal hernia; porta cath; Eye surgery (Bilateral); Video bronchoscopy with endobronchial navigation (N/A, 07/25/2016); Video bronchoscopy with endobronchial ultrasound (N/A, 07/25/2016); Fuducial placement (Left, 07/25/2016); and Ankle surgery (Right, 1997).   His family history includes  Diabetes in his sister and son; Drug abuse in his daughter; Heart attack in his daughter; Hypertension in his father and mother; Rheum arthritis in his daughter; Stroke in his father.He reports that he has been smoking cigarettes.  He started smoking about 74 years ago. He has a 72.00 pack-year smoking history. He has never used smokeless tobacco. He reports that he does not drink alcohol or use drugs.    ROS Review of Systems  Constitutional: Negative.   HENT: Negative.   Eyes: Negative for visual disturbance.  Respiratory: Negative for cough and shortness of breath.   Cardiovascular: Negative for chest pain and leg swelling.  Gastrointestinal: Negative for abdominal pain, diarrhea, nausea and vomiting.  Genitourinary: Negative for difficulty urinating.  Musculoskeletal: Negative for arthralgias and myalgias.  Skin: Negative for rash.  Neurological: Negative for headaches.  Psychiatric/Behavioral: Negative for sleep disturbance.    Objective:  BP 123/62   Pulse 63   Temp (!) 96.7 F (35.9 C) (Oral)   Ht 5' 5.5" (1.664 m)   Wt 124 lb (56.2 kg)   BMI 20.32 kg/m    BP Readings from Last 3 Encounters:  07/10/17 123/62  04/22/17 (!) 143/70  04/08/17 (!) 167/84    Wt Readings from Last 3 Encounters:  07/10/17 124 lb (56.2 kg)  04/22/17 128 lb 2 oz (58.1 kg)  04/08/17 131 lb 2 oz (59.5 kg)     Physical Exam  Constitutional: He is oriented to person, place, and time. He appears well-developed and well-nourished. No distress.  HENT:  Head:  Normocephalic and atraumatic.  Right Ear: External ear normal.  Left Ear: External ear normal.  Nose: Nose normal.  Mouth/Throat: Oropharynx is clear and moist.  Eyes: Pupils are equal, round, and reactive to light. Conjunctivae and EOM are normal.  Neck: Normal range of motion. Neck supple. No thyromegaly present.  Cardiovascular: Normal rate, regular rhythm and normal heart sounds.  No murmur heard. Pulmonary/Chest: Effort normal and  breath sounds normal. No respiratory distress. He has no wheezes. He has no rales.  Abdominal: Soft. Bowel sounds are normal. He exhibits no distension. There is no tenderness.  Lymphadenopathy:    He has no cervical adenopathy.  Neurological: He is alert and oriented to person, place, and time. He has normal reflexes.  Skin: Skin is warm and dry.  Psychiatric: He has a normal mood and affect. His behavior is normal. Judgment and thought content normal.      Assessment & Plan:   Alexis was seen today for medical management of chronic issues.  Diagnoses and all orders for this visit:  Other specified hypothyroidism  Chronic obstructive pulmonary disease, unspecified COPD type ([place])  Primary cancer of left upper lobe of lung ([place])  Normocytic anemia  Hyperlipidemia, unspecified hyperlipidemia type  Other orders -     donepezil (ARICEPT) 10 MG tablet; Take 1 tablet (10 mg total) by mouth at bedtime. -     meclizine (ANTIVERT) 25 MG tablet; Take 1 tablet (25 mg total) by mouth 3 (three) times daily as needed for dizziness. -     fluocinonide-emollient (LIDEX-E) 0.05 % cream; Apply 1 application topically 2 (two) times daily.   I have recommended that he make contact with his oncologist as soon as possible.  He will let me know if we need to intervene with the referral etc.   I am having Marquita Palms. Dolloff start on meclizine and fluocinonide-emollient. I am also having him maintain his lisinopril, levothyroxine, and donepezil.  Allergies as of 07/10/2017      Reactions   Chantix [varenicline] Other (See Comments)   Sleep walking       Medication List        Accurate as of 07/10/17  5:01 PM. Always use your most recent med list.          donepezil 10 MG tablet Commonly known as:  ARICEPT Take 1 tablet (10 mg total) by mouth at bedtime.   fluocinonide-emollient 0.05 % cream Commonly known as:  LIDEX-E Apply 1 application topically 2 (two) times daily.   levothyroxine  88 MCG tablet Commonly known as:  SYNTHROID, LEVOTHROID TAKE 1 TABLET BY MOUTH EVERY DAY   lisinopril 10 MG tablet Commonly known as:  PRINIVIL,ZESTRIL Take 1 tablet (10 mg total) by mouth daily.   meclizine 25 MG tablet Commonly known as:  ANTIVERT Take 1 tablet (25 mg total) by mouth 3 (three) times daily as needed for dizziness.     Change Aricept dose to nightly.  We carefully reviewed the need to take the thyroid tablet as soon as he gets up and wait 1 hour before eating or drinking anything other than water.  Follow-up: Return in about 6 months (around 01/09/2018).  [name], M.D.

## 2017-10-02 ENCOUNTER — Ambulatory Visit (INDEPENDENT_AMBULATORY_CARE_PROVIDER_SITE_OTHER): Payer: Medicare HMO | Admitting: Family Medicine

## 2017-10-02 ENCOUNTER — Encounter: Payer: Self-pay | Admitting: Family Medicine

## 2017-10-02 VITALS — BP 141/71 | HR 69 | Temp 97.0°F | Ht 65.5 in | Wt 116.0 lb

## 2017-10-02 DIAGNOSIS — I10 Essential (primary) hypertension: Secondary | ICD-10-CM | POA: Diagnosis not present

## 2017-10-02 DIAGNOSIS — G301 Alzheimer's disease with late onset: Secondary | ICD-10-CM | POA: Diagnosis not present

## 2017-10-02 DIAGNOSIS — F0281 Dementia in other diseases classified elsewhere with behavioral disturbance: Secondary | ICD-10-CM

## 2017-10-02 DIAGNOSIS — N183 Chronic kidney disease, stage 3 unspecified: Secondary | ICD-10-CM

## 2017-10-02 DIAGNOSIS — E038 Other specified hypothyroidism: Secondary | ICD-10-CM | POA: Diagnosis not present

## 2017-10-02 DIAGNOSIS — M1990 Unspecified osteoarthritis, unspecified site: Secondary | ICD-10-CM | POA: Diagnosis not present

## 2017-10-02 MED ORDER — MECLIZINE HCL 25 MG PO TABS: 25.0000 mg | ORAL_TABLET | Freq: Three times a day (TID) | ORAL | 3 refills | Status: DC | PRN

## 2017-10-02 MED ORDER — LEVOTHYROXINE SODIUM 88 MCG PO TABS: 88.0000 ug | ORAL_TABLET | Freq: Every day | ORAL | 3 refills | Status: DC

## 2017-10-02 MED ORDER — IBUPROFEN 200 MG PO TABS: 400.0000 mg | ORAL_TABLET | Freq: Three times a day (TID) | ORAL | 0 refills | Status: DC

## 2017-10-02 MED ORDER — LISINOPRIL 10 MG PO TABS: 10.0000 mg | ORAL_TABLET | Freq: Every day | ORAL | 3 refills | Status: DC

## 2017-10-02 MED ORDER — DONEPEZIL HCL 10 MG PO TABS: 10.0000 mg | ORAL_TABLET | Freq: Every day | ORAL | 3 refills | Status: DC

## 2017-10-02 NOTE — Progress Notes (Signed)
Subjective:  Patient ID: Kevin Ho, male    DOB: 1931/08/13  Age: 84 y.o. MRN: [id number]  CC: Hypertension; Dementia; and Medication Refill   HPI Kevin Ho presents for patient presents for follow-up on  thyroid. The patient has a history of hypothyroidism for many years. It has been stable recently. Pt. denies any change in  voice, loss of hair, heat or cold intolerance. Energy level has been adequate to good. Patient denies constipation and diarrhea. No myxedema. Medication is as noted below. Verified that pt is taking it daily on an empty stomach. Well tolerated.  Patient did go off of the medicine for his thyroid briefly and feels that he got back on it.  This was few months ago.  Things have been back to normal for quite a time.  He tells me also that he tried to go off the lisinopril because he is having some dizziness.  The dizziness actually got worse so he went back on it.  No record of the blood pressure during that time.  However, he is back on it now blood pressure is doing well.  Patient has discontinued the donepezil.  There was some discussion between he and his son regarding whether he had breakfast this morning or not his mental status seems stable as he is conversant and answers questions appropriately throughout the exam other than the breakfast discussion.  Depression screen [facility] 2/9 10/02/2017 03/11/2017 02/28/2017  Decreased Interest 1 0 0  Down, Depressed, Hopeless 0 1 1  PHQ - 2 Score 1 1 1   Altered sleeping - - -  Tired, decreased energy - - -  Change in appetite - - -  Feeling bad or failure about yourself  - - -  Trouble concentrating - - -  Moving slowly or fidgety/restless - - -  Suicidal thoughts - - -  PHQ-9 Score - - -    History Kevin Ho has a past medical history of Alcoholism ([place]), Anxiety, Arthritis, Cancer ([place]), Chronic pain, Dementia, Glaucoma, Headache, History of radiation therapy (10/15/16-11/28/16), Hyperlipidemia, Hypertension,  Hypothyroidism, Insomnia, Lung cancer ([place]), and Varicose veins.   He has a past surgical history that includes right inguinal hernia; porta cath; Eye surgery (Bilateral); Video bronchoscopy with endobronchial navigation (N/A, 07/25/2016); Video bronchoscopy with endobronchial ultrasound (N/A, 07/25/2016); Fuducial placement (Left, 07/25/2016); and Ankle surgery (Right, 1997).   His family history includes Diabetes in his sister and son; Drug abuse in his daughter; Heart attack in his daughter; Hypertension in his father and mother; Rheum arthritis in his daughter; Stroke in his father.He reports that he has been smoking cigarettes. He started smoking about 74 years ago. He has a 72.00 pack-year smoking history. He has never used smokeless tobacco. He reports that he does not drink alcohol or use drugs.    ROS Review of Systems  Constitutional: Negative.   HENT: Negative.   Eyes: Negative for visual disturbance.  Respiratory: Negative for cough and shortness of breath.   Cardiovascular: Negative for chest pain and leg swelling.  Gastrointestinal: Negative for abdominal pain, diarrhea, nausea and vomiting.  Genitourinary: Negative for difficulty urinating.  Musculoskeletal: Positive for arthralgias (Taking ibuprofen 200 mg 2 p.o. 3 times daily for pain.,  OTC.). Negative for myalgias.  Skin: Negative for rash.  Neurological: Negative for headaches.  Psychiatric/Behavioral: Negative for sleep disturbance.    Objective:  BP (!) 141/71   Pulse 69   Temp (!) 97 F (36.1 C) (Oral)   Ht 5' 5.5" (1.664  m)   Wt 116 lb (52.6 kg)   BMI 19.01 kg/m   BP Readings from Last 3 Encounters:  10/02/17 (!) 141/71  07/10/17 123/62  04/22/17 (!) 143/70    Wt Readings from Last 3 Encounters:  10/02/17 116 lb (52.6 kg)  07/10/17 124 lb (56.2 kg)  04/22/17 128 lb 2 oz (58.1 kg)     Physical Exam  Constitutional: He is oriented to person, place, and time. He appears well-developed and  well-nourished. No distress.  HENT:  Head: Normocephalic and atraumatic.  Right Ear: External ear normal.  Left Ear: External ear normal.  Nose: Nose normal.  Mouth/Throat: Oropharynx is clear and moist.  Eyes: Pupils are equal, round, and reactive to light. Conjunctivae and EOM are normal.  Neck: Normal range of motion. Neck supple.  Cardiovascular: Normal rate, regular rhythm and normal heart sounds.  No murmur heard. Pulmonary/Chest: Effort normal and breath sounds normal. No respiratory distress. He has no wheezes. He has no rales.  Abdominal: Soft. There is no tenderness.  Musculoskeletal: Normal range of motion.  Neurological: He is alert and oriented to person, place, and time. He has normal reflexes.  Skin: Skin is warm and dry.  Psychiatric: He has a normal mood and affect. His behavior is normal. Judgment and thought content normal.      Assessment & Plan:   Kevin Ho was seen today for hypertension, dementia and medication refill.  Diagnoses and all orders for this visit:  Other specified hypothyroidism -     Thyroid Panel With TSH  CKD (chronic kidney disease), stage III ([place]) -     CMP14+EGFR  Essential hypertension -     CMP14+EGFR  Late onset Alzheimer's disease with behavioral disturbance  Arthritis  Other orders -     ibuprofen (ADVIL,MOTRIN) 200 MG tablet; Take 2 tablets (400 mg total) by mouth 3 (three) times daily. -     meclizine (ANTIVERT) 25 MG tablet; Take 1 tablet (25 mg total) by mouth 3 (three) times daily as needed for dizziness. -     lisinopril (PRINIVIL,ZESTRIL) 10 MG tablet; Take 1 tablet (10 mg total) by mouth daily. -     levothyroxine (SYNTHROID, LEVOTHROID) 88 MCG tablet; Take 1 tablet (88 mcg total) by mouth daily. -     donepezil (ARICEPT) 10 MG tablet; Take 1 tablet (10 mg total) by mouth at bedtime.       I have changed Marquita Palms. Kevin Ho's levothyroxine. I am also having him start on ibuprofen. Additionally, I am having him  maintain his fluocinonide-emollient, meclizine, lisinopril, and donepezil.  Allergies as of 10/02/2017      Reactions   Chantix [varenicline] Other (See Comments)   Sleep walking       Medication List        Accurate as of 10/02/17 12:10 PM. Always use your most recent med list.          donepezil 10 MG tablet Commonly known as:  ARICEPT Take 1 tablet (10 mg total) by mouth at bedtime.   fluocinonide-emollient 0.05 % cream Commonly known as:  LIDEX-E Apply 1 application topically 2 (two) times daily.   ibuprofen 200 MG tablet Commonly known as:  ADVIL,MOTRIN Take 2 tablets (400 mg total) by mouth 3 (three) times daily.   levothyroxine 88 MCG tablet Commonly known as:  SYNTHROID, LEVOTHROID Take 1 tablet (88 mcg total) by mouth daily.   lisinopril 10 MG tablet Commonly known as:  PRINIVIL,ZESTRIL Take 1 tablet (10 mg total)  by mouth daily.   meclizine 25 MG tablet Commonly known as:  ANTIVERT Take 1 tablet (25 mg total) by mouth 3 (three) times daily as needed for dizziness.      Patient stable at this time regarding exam.  Blood work for thyroid pending.  I do have some concerns about using the lisinopril and ibuprofen with his kidney functions.  We will wait and deal with those should his BUN/creatinine be elevated.  He is still smoking we discussed cessation.  He does not have any interest at that.  Risks reviewed.  Follow-up: No follow-ups on file.  [name], M.D.

## 2017-10-03 ENCOUNTER — Encounter: Payer: Self-pay | Admitting: Pediatrics

## 2017-10-03 ENCOUNTER — Ambulatory Visit (INDEPENDENT_AMBULATORY_CARE_PROVIDER_SITE_OTHER): Payer: Medicare HMO | Admitting: Pediatrics

## 2017-10-03 VITALS — BP 144/82 | HR 65

## 2017-10-03 DIAGNOSIS — E875 Hyperkalemia: Secondary | ICD-10-CM

## 2017-10-03 DIAGNOSIS — I10 Essential (primary) hypertension: Secondary | ICD-10-CM | POA: Diagnosis not present

## 2017-10-03 LAB — CMP14+EGFR
A/G RATIO: 1.7 (ref 1.2–2.2)
ALT: 19 IU/L (ref 0–44)
AST: 18 IU/L (ref 0–40)
Albumin: 4.7 g/dL (ref 3.5–4.7)
Alkaline Phosphatase: 78 IU/L (ref 39–117)
BILIRUBIN TOTAL: 0.2 mg/dL (ref 0.0–1.2)
BUN/Creatinine Ratio: 13 (ref 10–24)
BUN: 22 mg/dL (ref 8–27)
CALCIUM: 11.1 mg/dL — AB (ref 8.6–10.2)
CO2: 17 mmol/L — ABNORMAL LOW (ref 20–29)
Chloride: 110 mmol/L — ABNORMAL HIGH (ref 96–106)
Creatinine, Ser: 1.65 mg/dL — ABNORMAL HIGH (ref 0.76–1.27)
GFR, EST AFRICAN AMERICAN: 43 mL/min/{1.73_m2} — AB (ref >59)
GFR, EST NON AFRICAN AMERICAN: 38 mL/min/{1.73_m2} — AB (ref >59)
GLUCOSE: 102 mg/dL — AB (ref 65–99)
Globulin, Total: 2.8 g/dL (ref 1.5–4.5)
Potassium: 7.8 mmol/L (ref 3.5–5.2)
Sodium: 149 mmol/L — ABNORMAL HIGH (ref 134–144)
TOTAL PROTEIN: 7.5 g/dL (ref 6.0–8.5)

## 2017-10-03 LAB — BMP8+EGFR
BUN/Creatinine Ratio: 16 (ref 10–24)
BUN: 22 mg/dL (ref 8–27)
CALCIUM: 9.6 mg/dL (ref 8.6–10.2)
CO2: 24 mmol/L (ref 20–29)
Chloride: 106 mmol/L (ref 96–106)
Creatinine, Ser: 1.35 mg/dL — ABNORMAL HIGH (ref 0.76–1.27)
GFR calc Af Amer: 55 mL/min/{1.73_m2} — ABNORMAL LOW (ref >59)
GFR calc non Af Amer: 48 mL/min/{1.73_m2} — ABNORMAL LOW (ref >59)
Glucose: 107 mg/dL — ABNORMAL HIGH (ref 65–99)
POTASSIUM: 4.5 mmol/L (ref 3.5–5.2)
Sodium: 139 mmol/L (ref 134–144)

## 2017-10-03 LAB — THYROID PANEL WITH TSH
FREE THYROXINE INDEX: 2.1 (ref 1.2–4.9)
T3 UPTAKE RATIO: 32 % (ref 24–39)
T4, Total: 6.7 ug/dL (ref 4.5–12.0)
TSH: 0.789 u[IU]/mL (ref 0.450–4.500)

## 2017-10-03 MED ORDER — SODIUM POLYSTYRENE SULFONATE 15 GM/60ML PO SUSP: ORAL | 0 refills | Status: DC

## 2017-10-03 MED ORDER — AMLODIPINE BESYLATE 5 MG PO TABS: 5.0000 mg | ORAL_TABLET | Freq: Every day | ORAL | 3 refills | Status: DC

## 2017-10-03 NOTE — Patient Instructions (Addendum)
Take kayexalate this morning, if no stool within 1-2 hours take 1-2 doses of miralax. Take another dose this afternoon (apprx 6 hrs later). Again, take miralax 1-2 doses if no stool within 2 hours of kayexalate.  We will call this afternoon with potassium level. If still elevated will have you take another dose this evening, followed by miralax again if needed.  Stop lisinopril and ibuprofen. OK to take tylenol or use topical medicines for pain.  Start amlodipine once a day in the morning for blood pressure

## 2017-10-03 NOTE — Progress Notes (Signed)
  Subjective:   Patient ID: Kevin Ho, male    DOB: [birth year], 84 y.o.   MRN: [id number] CC: Abnormal lab results and Abnormal ECG  HPI: DOMENIK TRICE is a 84 y.o. male   Here today with daughter-in-law, probable because of hyperkalemia.  Has not been drinking as much fluid recently, he does think he is somewhat dehydrated.  Had labs drawn yesterday, potassium 7.8.  Relevant past medical, surgical, family and social history reviewed. Allergies and medications reviewed and updated. Social History   Tobacco Use  Smoking Status Current Every Day Smoker  . Packs/day: 1.00  . Years: 72.00  . Pack years: 72.00  . Types: Cigarettes  . Start date: 01/16/1943  Smokeless Tobacco Never Used  Tobacco Comment   -started age 37, currenlty smoking 1 ppd   ROS: Per HPI   EKG: Reviewed, similar to prior.  No peaked T waves.    Objective:    BP (!) 144/82   Pulse 65   Wt Readings from Last 3 Encounters:  10/02/17 116 lb (52.6 kg)   Gen: NAD, alert, cooperative with exam, NCAT EYES: EOMI, no conjunctival injection, or no icterus CV: NRRR, normal S1/S2, no murmur, distal pulses 2+ b/l Resp: CTABL, no wheezes, normal WOB Ext: No edema, warm Neuro: Alert and oriented MSK: normal muscle bulk  Assessment & Plan:  Nikalas was seen today for abnormal lab results and abnormal ecg.  Diagnoses and all orders for this visit:  Serum potassium elevated Will send BMP stat for repeat K Take dose of Kayexalate this morning, repeat in 6 hours.  Follow doses with MiraLAX within 1 to 2 hours if no stool.  Must have stool within 6 hours of taking the medicine. -     BMP8+EGFR -     EKG 12-Lead -     BMP8+EGFR -     sodium polystyrene (KAYEXALATE) 15 GM/60ML suspension; Take as directed  Essential hypertension Stop lisinopril.  Has had repeated elevated potassium levels.  Will start amlodipine for blood pressure control.  Patient to check BPs at home, bring to next office visit. -      amLODipine (NORVASC) 5 MG tablet; Take 1 tablet (5 mg total) by mouth daily.   Follow up plan: 3 weeks with pcp Assunta Found, MD [place]

## 2017-10-04 ENCOUNTER — Telehealth: Payer: Self-pay | Admitting: Family Medicine

## 2017-10-04 NOTE — Telephone Encounter (Signed)
Please advise 

## 2017-10-06 ENCOUNTER — Other Ambulatory Visit: Payer: Self-pay | Admitting: Family Medicine

## 2017-10-06 NOTE — Telephone Encounter (Signed)
Please tell pt.'s daughter that the change in medicine would have had to be made eventually anyway. It wasn't essential to do so right now since the repeat potassium was okay. On the other hand, meds for joint pain are not compatible with the old BP med, lisinopril, so if he needs an arthritis medicine, the change of BP meds helps facilitate that. Thanks, WS

## 2017-10-07 NOTE — Telephone Encounter (Signed)
Lm 9/23-jhb

## 2017-10-08 NOTE — Telephone Encounter (Signed)
Called and daughter aware

## 2017-10-18 ENCOUNTER — Telehealth: Payer: Self-pay | Admitting: *Deleted

## 2017-10-18 NOTE — Telephone Encounter (Signed)
CALLED PATIENT TO INFORM OF CT FOR 10-22-17 - ARRIVAL TIME - 1:15 PM, NO RESTRICTIONS TO TEST, TEST TO BE @ [facility], PT. TO COME IN FOR FU ON 10-28-17 @ 3:15 PM FOR RESULTS, SPOKE WITH PATIENT'S DAUGHTER - JENNY AND SHE IS AWARE OF THESE APPTS.

## 2017-10-21 ENCOUNTER — Ambulatory Visit: Payer: Medicare HMO | Admitting: Radiation Oncology

## 2017-10-22 ENCOUNTER — Ambulatory Visit (HOSPITAL_COMMUNITY)
Admission: RE | Admit: 2017-10-22 | Discharge: 2017-10-22 | Disposition: A | Discharge status: Home / Self Care | Payer: Medicare HMO | Source: Ambulatory Visit | Attending: Radiation Oncology | Admitting: Radiation Oncology

## 2017-10-22 DIAGNOSIS — I700 Atherosclerosis of aorta: Secondary | ICD-10-CM | POA: Insufficient documentation

## 2017-10-22 DIAGNOSIS — R911 Solitary pulmonary nodule: Secondary | ICD-10-CM | POA: Insufficient documentation

## 2017-10-22 DIAGNOSIS — J439 Emphysema, unspecified: Secondary | ICD-10-CM | POA: Insufficient documentation

## 2017-10-22 DIAGNOSIS — C3412 Malignant neoplasm of upper lobe, left bronchus or lung: Secondary | ICD-10-CM | POA: Diagnosis not present

## 2017-10-22 DIAGNOSIS — R918 Other nonspecific abnormal finding of lung field: Secondary | ICD-10-CM | POA: Diagnosis not present

## 2017-10-23 ENCOUNTER — Ambulatory Visit (INDEPENDENT_AMBULATORY_CARE_PROVIDER_SITE_OTHER): Payer: Medicare HMO | Admitting: Family Medicine

## 2017-10-23 ENCOUNTER — Encounter: Payer: Self-pay | Admitting: Family Medicine

## 2017-10-23 VITALS — BP 128/83 | HR 74 | Temp 97.1°F | Ht 65.5 in | Wt 115.0 lb

## 2017-10-23 DIAGNOSIS — Z23 Encounter for immunization: Secondary | ICD-10-CM | POA: Diagnosis not present

## 2017-10-23 DIAGNOSIS — E875 Hyperkalemia: Secondary | ICD-10-CM

## 2017-10-23 DIAGNOSIS — F02818 Dementia in other diseases classified elsewhere, unspecified severity, with other behavioral disturbance: Secondary | ICD-10-CM

## 2017-10-23 DIAGNOSIS — F0281 Dementia in other diseases classified elsewhere with behavioral disturbance: Secondary | ICD-10-CM | POA: Diagnosis not present

## 2017-10-23 DIAGNOSIS — G301 Alzheimer's disease with late onset: Secondary | ICD-10-CM

## 2017-10-23 DIAGNOSIS — I10 Essential (primary) hypertension: Secondary | ICD-10-CM | POA: Diagnosis not present

## 2017-10-23 NOTE — Progress Notes (Signed)
Subjective:  Patient ID: JAMARIAN JACINTO, male    DOB: [birth year]  Age: 84 y.o. MRN: [id number]  CC: three week recheck   HPI Dewie Ahart Matuska presents for patient is concerned that he is dizzy all the time.  He worries that his medicine is causing it.  Of note is that his son tells me that the dizziness is unchanged since before he started taking these medicines.  Specifically he was taken off lisinopril because of hyperkalemia.  He had tried stopping the lisinopril and discovered that the dizziness actually got worse when he was not taking it.  Therefore he was switched to amlodipine.  Dizziness again is unchanged.  He was started on Aricept recently and the dizziness is unchanged since before starting the Aricept.  This is a lightheadedness and a sense of being off balance without syncope being noted.  Patient had a dangerously high potassium level on his last check but the very next day the potassium level was normal.  He has discontinued ibuprofen as well as lisinopril as a result.  He is in today also for recheck of his potassium level.  Depression screen [facility] 2/9 10/03/2017 10/02/2017 03/11/2017  Decreased Interest 0 1 0  Down, Depressed, Hopeless 0 0 1  PHQ - 2 Score 0 1 1  Altered sleeping - - -  Tired, decreased energy - - -  Change in appetite - - -  Feeling bad or failure about yourself  - - -  Trouble concentrating - - -  Moving slowly or fidgety/restless - - -  Suicidal thoughts - - -  PHQ-9 Score - - -    History Alain has a past medical history of Alcoholism ([place]), Anxiety, Arthritis, Cancer ([place]), Chronic pain, Dementia ([place]), Glaucoma, Headache, History of radiation therapy (10/15/16-11/28/16), Hyperlipidemia, Hypertension, Hypothyroidism, Insomnia, Lung cancer ([place]), and Varicose veins.   He has a past surgical history that includes right inguinal hernia; porta cath; Eye surgery (Bilateral); Video bronchoscopy with endobronchial navigation (N/A, 07/25/2016); Video  bronchoscopy with endobronchial ultrasound (N/A, 07/25/2016); Fuducial placement (Left, 07/25/2016); and Ankle surgery (Right, 1997).   His family history includes Diabetes in his sister and son; Drug abuse in his daughter; Heart attack in his daughter; Hypertension in his father and mother; Rheum arthritis in his daughter; Stroke in his father.He reports that he has been smoking cigarettes. He started smoking about 74 years ago. He has a 72.00 pack-year smoking history. He has never used smokeless tobacco. He reports that he does not drink alcohol or use drugs.    ROS Review of Systems  Constitutional: Negative for activity change.  Respiratory: Negative for cough and shortness of breath.   Cardiovascular: Negative for chest pain, palpitations and leg swelling.  Neurological: Positive for dizziness and light-headedness.  Psychiatric/Behavioral: Positive for confusion.    Objective:  BP 128/83   Pulse 74   Temp (!) 97.1 F (36.2 C) (Oral)   Ht 5' 5.5" (1.664 m)   Wt 115 lb (52.2 kg)   BMI 18.85 kg/m   BP Readings from Last 3 Encounters:  10/23/17 128/83  10/03/17 (!) 144/82  10/02/17 (!) 141/71    Wt Readings from Last 3 Encounters:  10/23/17 115 lb (52.2 kg)  10/02/17 116 lb (52.6 kg)  07/10/17 124 lb (56.2 kg)     Physical Exam  Constitutional: He is oriented to person, place, and time. He appears well-developed and well-nourished.  HENT:  Head: Normocephalic and atraumatic.  Right Ear: External ear normal.  Left Ear: External ear normal.  Mouth/Throat: No oropharyngeal exudate or posterior oropharyngeal erythema.  Eyes: Pupils are equal, round, and reactive to light.  Neck: Normal range of motion. Neck supple.  Cardiovascular: Normal rate and regular rhythm.  No murmur heard. Pulmonary/Chest: Breath sounds normal. No respiratory distress.  Neurological: He is alert and oriented to person, place, and time.  Vitals reviewed.     Assessment & Plan:   Joandry was  seen today for three week recheck.  Diagnoses and all orders for this visit:  Late onset Alzheimer's disease with behavioral disturbance ([place])  Hyperkalemia -     BMP8+EGFR  Encounter for immunization -     Flu vaccine HIGH DOSE PF  Essential hypertension       I have discontinued Marquita Palms. Premo's fluocinonide-emollient and sodium polystyrene. I am also having him maintain his meclizine, levothyroxine, donepezil, and amLODipine.  Allergies as of 10/23/2017      Reactions   Chantix [varenicline] Other (See Comments)   Sleep walking    Lisinopril    hyperkalemia      Medication List        Accurate as of 10/23/17  5:26 PM. Always use your most recent med list.          amLODipine 5 MG tablet Commonly known as:  NORVASC Take 1 tablet (5 mg total) by mouth daily.   donepezil 10 MG tablet Commonly known as:  ARICEPT Take 1 tablet (10 mg total) by mouth at bedtime.   levothyroxine 88 MCG tablet Commonly known as:  SYNTHROID, LEVOTHROID Take 1 tablet (88 mcg total) by mouth daily.   meclizine 25 MG tablet Commonly known as:  ANTIVERT Take 1 tablet (25 mg total) by mouth 3 (three) times daily as needed for dizziness.        Follow-up: Return in about 3 months (around 01/23/2018), or if symptoms worsen or fail to improve.  [name], M.D.

## 2017-10-24 ENCOUNTER — Ambulatory Visit: Payer: Medicare HMO | Admitting: Radiation Oncology

## 2017-10-24 LAB — BMP8+EGFR
BUN/Creatinine Ratio: 20 (ref 10–24)
BUN: 23 mg/dL (ref 8–27)
CHLORIDE: 102 mmol/L (ref 96–106)
CO2: 23 mmol/L (ref 20–29)
Calcium: 10.3 mg/dL — ABNORMAL HIGH (ref 8.6–10.2)
Creatinine, Ser: 1.16 mg/dL (ref 0.76–1.27)
GFR calc non Af Amer: 58 mL/min/{1.73_m2} — ABNORMAL LOW (ref >59)
GFR, EST AFRICAN AMERICAN: 66 mL/min/{1.73_m2} (ref >59)
Glucose: 89 mg/dL (ref 65–99)
POTASSIUM: 4.9 mmol/L (ref 3.5–5.2)
Sodium: 142 mmol/L (ref 134–144)

## 2017-10-28 ENCOUNTER — Ambulatory Visit: Admission: RE | Admit: 2017-10-28 | Payer: Medicare HMO | Source: Ambulatory Visit | Admitting: Radiation Oncology

## 2017-12-18 ENCOUNTER — Encounter: Payer: Self-pay | Admitting: Pediatrics

## 2017-12-18 ENCOUNTER — Ambulatory Visit (INDEPENDENT_AMBULATORY_CARE_PROVIDER_SITE_OTHER): Payer: Medicare HMO | Admitting: Pediatrics

## 2017-12-18 VITALS — BP 99/61 | HR 76 | Temp 97.8°F | Ht 65.5 in | Wt 113.0 lb

## 2017-12-18 DIAGNOSIS — F0390 Unspecified dementia without behavioral disturbance: Secondary | ICD-10-CM | POA: Diagnosis not present

## 2017-12-18 DIAGNOSIS — I10 Essential (primary) hypertension: Secondary | ICD-10-CM

## 2017-12-18 NOTE — Progress Notes (Signed)
Subjective:   Patient ID: Kevin Ho, male    DOB: 1931-08-22, 84 y.o.   MRN: [id number] CC: Weight Loss and No appetite  HPI: Kevin Ho is a 84 y.o. male   Here today with son.  Patient lives in an apartment behind son's house.  Son is concerned that the pt is not taking good care of himself.  Son does not think he is eating regular meals.  Family provides transportation for the patient to [employer] once to twice a week.  He gets one sixpack of Ensure or similar.  Patient says he drinks it 5 times a day.  Son says that is not possible because of the limited quantity he gets at [facility] visits.  Patient is not able to keep his apartment clean.  Son says they are cockroaches, old food left around.  Son is worried patient is not regularly bathing.  Patient disagrees with this and says that he does, the bathing happens in the main house and son says there are no signs that this has occurred regularly.  Son travels 2 weeks out of the month, he and his wife check on patient as often as patient and schedules allow them to.  Son's wife helps patient with feeling pillbox for his medicines but son does not think he is always taking them.   Patient says he has been feeling more lightheaded and dizzy at first in the last few days, also says this is been ongoing for months.  Patient wants to feel better.  Son wants to make sure that his father is been taking care of, he does not think that patient is able to care for himself well enough at home.  He forgets to eat, is not able to clean.  Patient has not allowed extra help at home.  The main house is [place] she[place]'s that of made extra help coming in difficult to schedule her son.  Patient's memory and cleanliness of gotten worse over the last few months to years.  Relevant past medical, surgical, family and social history reviewed. Allergies and medications reviewed and updated. Social History   Tobacco Use  Smoking Status Current Every Day Smoker   . Packs/day: 1.00  . Years: 72.00  . Pack years: 72.00  . Types: Cigarettes  . Start date: 01/16/1943  Smokeless Tobacco Never Used  Tobacco Comment   -started age 62, currenlty smoking 1 ppd   ROS: Per HPI   Objective:    BP 99/61   Pulse 76   Temp 97.8 F (36.6 C) (Oral)   Ht 5' 5.5" (1.664 m)   Wt 113 lb (51.3 kg)   BMI 18.52 kg/m   Wt Readings from Last 3 Encounters:  12/18/17 113 lb (51.3 kg)  10/23/17 115 lb (52.2 kg)  10/02/17 116 lb (52.6 kg)    Gen: NAD, alert, cooperative with exam, NCAT EYES: EOMI, no conjunctival injection, or no icterus CV: WWP Resp: normal WOB Ext: No edema, warm Neuro: Alert and oriented to person place, strength equal b/l UE and LE, coordination grossly normal MSK: normal muscle bulk Psych: Good eye contact, normal affect.  Disheveled.  Assessment & Plan:  Kevin Ho was seen today for weight loss and no appetite.  Diagnoses and all orders for this visit:  Dementia without behavioral disturbance, unspecified dementia type [facility]) Patient with limited insight into not taking his medicines and cleanliness.  Does have strong feelings about wanting to feel better and wanting to live as he wants to  live.  He disagrees with his son on many points about cleanliness and nutrition, what is really happening at home.  Discussed options including referral to palliative care to help with goals of care discussion, symptom relief with worsening dementia and intermittent dizziness.  Son is open to further help and support.  Could also likely have patient enrolled in chronic care management through this office if they decide not to go through with palliative care.  Gave son information on advanced care planning to be discussed at future office visits. -     Amb Referral to Palliative Care  Essential hypertension Low blood pressure today.  Taking amlodipine 5 mg.  Will stop amlodipine for now.  Follow-up with primary care doctor within the next 2 to 3 weeks.  I  spent 25 minutes with the patient with over 50% of the encounter time dedicated to counseling on the above problems.   Follow up plan: Return in about 3 weeks (around 01/08/2018) with PCP. Assunta Found, MD [place]

## 2017-12-18 NOTE — Patient Instructions (Addendum)
[email or web address].bass.[email or web address] [phone]   Advance Directive Advance directives are legal documents that let you make choices ahead of time about your health care and medical treatment in case you become unable to communicate for yourself. Advance directives are a way for you to communicate your wishes to family, friends, and health care providers. This can help convey your decisions about end-of-life care if you become unable to communicate. Discussing and writing advance directives should happen over time rather than all at once. Advance directives can be changed depending on your situation and what you want, even after you have signed the advance directives. If you do not have an advance directive, some states assign family decision makers to act on your behalf based on how closely you are related to them. Each state has its own laws regarding advance directives. You may want to check with your health care provider, attorney, or state representative about the laws in your state. There are different types of advance directives, such as:  Medical power of attorney.  Living will.  Do not resuscitate (DNR) or do not attempt resuscitation (DNAR) order.  Health care proxy and medical power of attorney A health care proxy, also called a health care agent, is a person who is appointed to make medical decisions for you in cases in which you are unable to make the decisions yourself. Generally, people choose someone they know well and trust to represent their preferences. Make sure to ask this person for an agreement to act as your proxy. A proxy may have to exercise judgment in the event of a medical decision for which your wishes are not known. A medical power of attorney is a legal document that names your health care proxy. Depending on the laws in your state, after the document is written, it may also need to be:  Signed.  Notarized.  Dated.  Copied.  Witnessed.  Incorporated into  your medical record.  You may also want to appoint someone to manage your financial affairs in a situation in which you are unable to do so. This is called a durable power of attorney for finances. It is a separate legal document from the durable power of attorney for health care. You may choose the same person or someone different from your health care proxy to act as your agent in financial matters. If you do not appoint a proxy, or if there is a concern that the proxy is not acting in your best interests, a court-appointed guardian may be designated to act on your behalf. Living will A living will is a set of instructions documenting your wishes about medical care when you cannot express them yourself. Health care providers should keep a copy of your living will in your medical record. You may want to give a copy to family members or friends. To alert caregivers in case of an emergency, you can place a card in your wallet to let them know that you have a living will and where they can find it. A living will is used if you become:  Terminally ill.  Incapacitated.  Unable to communicate or make decisions.  Items to consider in your living will include:  The use or non-use of life-sustaining equipment, such as dialysis machines and breathing machines (ventilators).  A DNR or DNAR order, which is the instruction not to use cardiopulmonary resuscitation (CPR) if breathing or heartbeat stops.  The use or non-use of tube feeding.  Withholding of food and fluids.  Comfort (palliative)  care when the goal becomes comfort rather than a cure.  Organ and tissue donation.  A living will does not give instructions for distributing your money and property if you should pass away. It is recommended that you seek the advice of a lawyer when writing a will. Decisions about taxes, beneficiaries, and asset distribution will be legally binding. This process can relieve your family and friends of any concerns  surrounding disputes or questions that may come up about the distribution of your assets. DNR or DNAR A DNR or DNAR order is a request not to have CPR in the event that your heart stops beating or you stop breathing. If a DNR or DNAR order has not been made and shared, a health care provider will try to help any patient whose heart has stopped or who has stopped breathing. If you plan to have surgery, talk with your health care provider about how your DNR or DNAR order will be followed if problems occur. Summary  Advance directives are the legal documents that allow you to make choices ahead of time about your health care and medical treatment in case you become unable to communicate for yourself.  The process of discussing and writing advance directives should happen over time. You can change the advance directives, even after you have signed them.  Advance directives include DNR or DNAR orders, living wills, and designating an agent as your medical power of attorney. This information is not intended to replace advice given to you by your health care provider. Make sure you discuss any questions you have with your health care provider. Document Released: 04/10/2007 Document Revised: 11/21/2015 Document Reviewed: 11/21/2015 Elsevier Interactive Patient Education  2017 [employer].

## 2017-12-19 ENCOUNTER — Ambulatory Visit: Payer: Self-pay | Admitting: *Deleted

## 2017-12-19 DIAGNOSIS — F0390 Unspecified dementia without behavioral disturbance: Secondary | ICD-10-CM

## 2017-12-19 DIAGNOSIS — G301 Alzheimer's disease with late onset: Secondary | ICD-10-CM

## 2017-12-19 DIAGNOSIS — F0281 Dementia in other diseases classified elsewhere with behavioral disturbance: Secondary | ICD-10-CM

## 2017-12-19 DIAGNOSIS — F02818 Dementia in other diseases classified elsewhere, unspecified severity, with other behavioral disturbance: Secondary | ICD-10-CM

## 2017-12-19 NOTE — Chronic Care Management (AMB) (Signed)
  Care Management   Note  12/19/2017 Name: BRAM HOTTEL MRN: [id number] DOB: 1931/01/26   Collaboration with Dr. Assunta Found after her face to face office visit with Mr. Wing Schoch yesterday. Dr. Evette Doffing referred Mr. Hollingshed to [facility] for worsening demential and a goals of care discussion with patient/son.    Goals Addressed    . Patient and Family Caregiver request assistance with transition to Palliative Care        Clinical Goal(s):   Over the next 7 days, patient and/or family caregivers will verbalize receipt of contact from [facility] team  Over the next 30 days, patient and/or family caregivers will verbalize initiation of palliative care services and understanding of palliative plan of care  Interventions: collaboration with PCP re: patient care needs and level of care concerns     . Patient and Family Education and Guidance needs  re: Advanced Directives        Clinical Goal(s): Over the next 14 days, patient and/or family caregiver will verbalize understanding of Advanced Directives options and will be provided with assistance as reqeusted  Interventions: noted Dr. Autumn Patty conversation and provision of educational materials to patient/family re: Advanced Directives; follow up scheduled to outreach to patient and/or family for ongoing discussion and assistance from CM team       Plan: The Care Management team will follow up with Mr. Dumont and/or his son/daughter in law (designated to receive information from clinical providers) and with [facility] to ensure that referral was received and patient is contacted.   Mr. Pring and his family will be contacted by the case management team over the next 7 days re: Palliative Care needs and enrollment in the CCM program if needed/desired (until Palliative Care services are in place at which time Case Management services are included).   [place]  [phone]

## 2017-12-19 NOTE — Patient Instructions (Signed)
The care management team at [facility] will help you with transition to Palliative Care Services.   Please call the office or care management team directly with any questions, needs, or concerns.   [place]  [phone]

## 2017-12-23 ENCOUNTER — Ambulatory Visit: Payer: Self-pay | Admitting: *Deleted

## 2017-12-23 NOTE — Progress Notes (Signed)
  Chronic Care Management   Note  12/23/2017 Name: Kevin Ho MRN: [id number] DOB: 08/18/1931 Contacted [facility] at [phone] to follow up on palliative care referral. They stated that they have not received the referral. I also contacted Kevin Ho's daughter-in-law, Kevin Ho, who is listed on on his HIPAA form. She stated that they have not been contacted by an agency to arrange palliative care services.   Plan Will talk with in-house palliative care coordinator about the status of the referral on our end. [name] that I will follow up with her by phone within the next 3 days. She can reach out to [place] sooner if necessary.    [name], RN-BC, BSN Nurse Case Manager [facility] Ph: [phone]

## 2017-12-26 ENCOUNTER — Ambulatory Visit: Payer: Self-pay | Admitting: *Deleted

## 2017-12-26 DIAGNOSIS — I10 Essential (primary) hypertension: Secondary | ICD-10-CM

## 2017-12-26 DIAGNOSIS — E038 Other specified hypothyroidism: Secondary | ICD-10-CM

## 2017-12-26 DIAGNOSIS — D649 Anemia, unspecified: Secondary | ICD-10-CM

## 2017-12-26 DIAGNOSIS — F411 Generalized anxiety disorder: Secondary | ICD-10-CM

## 2017-12-26 DIAGNOSIS — N183 Chronic kidney disease, stage 3 unspecified: Secondary | ICD-10-CM

## 2017-12-26 DIAGNOSIS — J449 Chronic obstructive pulmonary disease, unspecified: Secondary | ICD-10-CM

## 2017-12-26 DIAGNOSIS — E785 Hyperlipidemia, unspecified: Secondary | ICD-10-CM

## 2017-12-26 NOTE — Chronic Care Management (AMB) (Signed)
  Chronic Care Management   Note  12/26/2017 Name: Kevin Ho MRN: [id number] DOB: Mar 26, 1931  Contacted [facility] to f/u on palliative care referral. They have received the referral but have not reached out to schedule a visit.   I also spoke with Kevin Ho to update her on the status. She stated that there hasn't been any deterioration in [name] condition and that they are checking in on him regularly. "He is still getting around on his on and eating."   Plan I will follow up with [facility] within 5 business days to see if they have reached out to [name] or his family. At that point if they have not made contact, then we will consider referring to a different agency.  [name]/Jennifer will f/u with our office as needed.  [name] is agreeable to this plan.   [name], RN-BC, BSN Nurse Case Manager [facility] Ph: [phone]

## 2017-12-31 ENCOUNTER — Ambulatory Visit: Payer: Medicare HMO | Admitting: Family Medicine

## 2018-01-10 ENCOUNTER — Ambulatory Visit: Payer: Medicare HMO | Admitting: Family Medicine

## 2018-01-22 ENCOUNTER — Telehealth: Payer: Self-pay | Admitting: Family Medicine

## 2018-01-22 ENCOUNTER — Ambulatory Visit: Payer: Self-pay | Admitting: *Deleted

## 2018-01-22 NOTE — Telephone Encounter (Signed)
See CCM documentation from 01/22/18.  [name], RN-BC, BSN Nurse Case Manager [facility] Ph: [phone]

## 2018-01-22 NOTE — Telephone Encounter (Signed)
Cyril Mourning, see message below.  I spoke with [name] and she is aware you are out of the office until tomorrow.  She would like for you to contact her when you return.  She said she has not heard anything from [facility].

## 2018-01-22 NOTE — Chronic Care Management (AMB) (Addendum)
  Chronic Care Management   Note  01/22/2018 Name: Kevin Ho MRN: [id number] DOB: 10/29/1931  [name] was referred to [facility] for palliative care services to aid in management of his worsening dementia. He has not been contacted to setup a visit. I called [facility] at [phone] and spoke with office staff. They again confirmed that the referral had been received. I was told that Jenny Reichmann would review the referral and contact [name] and/or his caregiver [name] today. Confirmed that they have the correct contact number for [place].   Addendum: I received a phone call from [place] with [facility]. She has contacted the number provided to her several times and left voicemails requesting a return phone call. The voicemail does not state a name but is a male voice. This is the only number they have for contact and I confirmed that [phone] is the only number that we have listed as well.   Plan I will call [name] tomorrow to follow up, because she is expecting a call from me then, and ask her to call Jenny Reichmann at [phone].    [name], RN-BC, BSN Nurse Case Manager [facility] Ph: [phone]

## 2018-01-23 ENCOUNTER — Ambulatory Visit: Payer: Self-pay | Admitting: *Deleted

## 2018-01-24 ENCOUNTER — Encounter: Payer: Self-pay | Admitting: Family Medicine

## 2018-01-24 ENCOUNTER — Ambulatory Visit (INDEPENDENT_AMBULATORY_CARE_PROVIDER_SITE_OTHER): Payer: Medicare HMO

## 2018-01-24 ENCOUNTER — Ambulatory Visit (INDEPENDENT_AMBULATORY_CARE_PROVIDER_SITE_OTHER): Payer: Medicare HMO | Admitting: Family Medicine

## 2018-01-24 VITALS — BP 122/80 | HR 76 | Temp 97.8°F | Ht 65.5 in | Wt 111.0 lb

## 2018-01-24 DIAGNOSIS — C3412 Malignant neoplasm of upper lobe, left bronchus or lung: Secondary | ICD-10-CM | POA: Diagnosis not present

## 2018-01-24 DIAGNOSIS — I10 Essential (primary) hypertension: Secondary | ICD-10-CM | POA: Diagnosis not present

## 2018-01-24 DIAGNOSIS — J449 Chronic obstructive pulmonary disease, unspecified: Secondary | ICD-10-CM

## 2018-01-24 DIAGNOSIS — Z85118 Personal history of other malignant neoplasm of bronchus and lung: Secondary | ICD-10-CM | POA: Diagnosis not present

## 2018-01-24 NOTE — Progress Notes (Signed)
Subjective:  Patient ID: Kevin Ho, male    DOB: 1931-08-18  Age: 85 y.o. MRN: [id number]  CC: Follow-up (3 month follow up)   HPI Kevin Ho presents for memory loss. Also Kevin Ho and his  Son (who gives most of the history) are concerned about his weight loss. They disagree about his eating habits. Son feels Kevin Ho needs to eat more. Kevin Ho is supplementing with protein shakes. Pt. Due for follow up regarding his lung Ca. Had a scan 3 mos ago. Has no report. Pt. Denies dyspnea currently. HAs had some in the past. Kevin Ho is forgetful says his son. Pt. However remembers basic orientation questions. Recall questions and follows directions. (Full MMSE not performed.  Depression screen [facility] 2/9 12/18/2017 10/03/2017 10/02/2017  Decreased Interest 0 0 1  Down, Depressed, Hopeless 0 0 0  PHQ - 2 Score 0 0 1  Altered sleeping - - -  Tired, decreased energy - - -  Change in appetite - - -  Feeling bad or failure about yourself  - - -  Trouble concentrating - - -  Moving slowly or fidgety/restless - - -  Suicidal thoughts - - -  PHQ-9 Score - - -    History Kevin Ho has a past medical history of Alcoholism ([place]), Anxiety, Arthritis, Cancer ([place]), Chronic pain, Dementia ([place]), Glaucoma, Headache, History of radiation therapy (10/15/16-11/28/16), Hyperlipidemia, Hypertension, Hypothyroidism, Insomnia, Lung cancer ([place]), and Varicose veins.   Kevin Ho has a past surgical history that includes right inguinal hernia; porta cath; Eye surgery (Bilateral); Video bronchoscopy with endobronchial navigation (N/A, 07/25/2016); Video bronchoscopy with endobronchial ultrasound (N/A, 07/25/2016); Fuducial placement (Left, 07/25/2016); and Ankle surgery (Right, 1997).   His family history includes Diabetes in his sister and son; Drug abuse in his daughter; Heart attack in his daughter; Hypertension in his father and mother; Rheum arthritis in his daughter; Stroke in his father.Kevin Ho reports that Kevin Ho has been smoking cigarettes. Kevin Ho  started smoking about 75 years ago. Kevin Ho has a 72.00 pack-year smoking history. Kevin Ho has never used smokeless tobacco. Kevin Ho reports that Kevin Ho does not drink alcohol or use drugs.    ROS Review of Systems  Constitutional: Positive for appetite change, fatigue and unexpected weight change.  HENT: Negative.   Eyes: Negative for visual disturbance.  Respiratory: Negative for cough and shortness of breath.   Cardiovascular: Negative for chest pain and leg swelling.  Gastrointestinal: Negative for abdominal pain, diarrhea, nausea and vomiting.  Genitourinary: Negative for difficulty urinating.  Musculoskeletal: Negative for arthralgias and myalgias.  Skin: Negative for rash.  Neurological: Negative for headaches.  Psychiatric/Behavioral: Negative for sleep disturbance.    Objective:  BP 122/80   Pulse 76   Temp 97.8 F (36.6 C) (Axillary) Comment (Src): Axillary  Ht 5' 5.5" (1.664 m)   Wt 111 lb (50.3 kg)   BMI 18.19 kg/m   BP Readings from Last 3 Encounters:  01/24/18 122/80  12/18/17 99/61  10/23/17 128/83    Wt Readings from Last 3 Encounters:  01/24/18 111 lb (50.3 kg)  12/18/17 113 lb (51.3 kg)  10/23/17 115 lb (52.2 kg)     Physical Exam Constitutional:      General: Kevin Ho is not in acute distress.    Appearance: Kevin Ho is well-developed.     Comments: Cachectic   HENT:     Head: Normocephalic and atraumatic.     Right Ear: External ear normal.     Left Ear: External ear normal.     Nose:  Nose normal.  Eyes:     Conjunctiva/sclera: Conjunctivae normal.     Pupils: Pupils are equal, round, and reactive to light.  Neck:     Musculoskeletal: Normal range of motion and neck supple.  Cardiovascular:     Rate and Rhythm: Normal rate and regular rhythm.     Heart sounds: Normal heart sounds. No murmur.  Pulmonary:     Effort: Pulmonary effort is normal. No respiratory distress.     Breath sounds: Normal breath sounds. No wheezing or rales.  Abdominal:     Palpations:  Abdomen is soft.     Tenderness: There is no abdominal tenderness.  Musculoskeletal: Normal range of motion.  Skin:    General: Skin is warm and dry.  Neurological:     Mental Status: Kevin Ho is alert and oriented to person, place, and time.     Cranial Nerves: No cranial nerve deficit.     Motor: Weakness (symmetric) present.     Deep Tendon Reflexes: Reflexes are normal and symmetric.  Psychiatric:        Behavior: Behavior normal.        Thought Content: Thought content normal.       Assessment & Plan:   Kevin Ho was seen today for follow-up.  Diagnoses and all orders for this visit:  Primary cancer of left upper lobe of lung ([place]) -     DG Chest 2 View; Future -     Ambulatory referral to Hospice  Chronic obstructive pulmonary disease, unspecified COPD type ([place]) -     DG Chest 2 View; Future  Essential hypertension       I am having Kevin Ho. [place] maintain his meclizine, levothyroxine, and donepezil.  Allergies as of 01/24/2018      Reactions   Chantix [varenicline] Other (See Comments)   Sleep walking    Lisinopril    hyperkalemia      Medication List       Accurate as of January 24, 2018 11:59 PM. Always use your most recent med list.        donepezil 10 MG tablet Commonly known as:  ARICEPT Take 1 tablet (10 mg total) by mouth at bedtime.   levothyroxine 88 MCG tablet Commonly known as:  SYNTHROID, LEVOTHROID Take 1 tablet (88 mcg total) by mouth daily.   meclizine 25 MG tablet Commonly known as:  ANTIVERT Take 1 tablet (25 mg total) by mouth 3 (three) times daily as needed for dizziness.      Based on weight looss and report of CT chest (reviewed) pt. Is likely terminal. Hosspice recommended.   Follow-up: Return in about 1 month (around 02/24/2018).  [name], M.D.

## 2018-01-29 ENCOUNTER — Telehealth: Payer: Self-pay | Admitting: Family Medicine

## 2018-01-29 NOTE — Telephone Encounter (Signed)
Pt's daughter in law aware x-ray showed no acute changes.

## 2018-01-30 ENCOUNTER — Ambulatory Visit: Payer: Self-pay | Admitting: *Deleted

## 2018-01-30 NOTE — Chronic Care Management (AMB) (Signed)
  Chronic Care Management   Note  01/23/2018 Name: Kevin Ho MRN: [id number] DOB: April 10, 1931  I received a return call from [name]'s daughter-in-law, [name]. I explained that [facility] has reached out several times but has not been able to talk with her. They have left voicemail messages requesting her to return their call. [name] said that she is not able to answer her phone at work and that she may have missed the calls. But she also said that she did not receive a voicemail. I provided [name] with [name]'s number at [facility] and she plans to call them tomorrow to arrange services.   Plan F/u with [facility] within the next 7 business days to see if services have been arranged.  [name], RN-BC, BSN Nurse Case Manager [facility] Ph: [phone]

## 2018-01-30 NOTE — Chronic Care Management (AMB) (Signed)
  Chronic Care Management   Note  01/30/2018 Name: JOHNEY PEROTTI MRN: [id number] DOB: 1931/02/03  I spoke with Jenny Reichmann with [facility] and she was able to reach Mr Hartzog's daughter in law by email. [name] advised her that Mr Maniscalco has been referred to [facility] and that palliative care services will no longer be necessary. Chart review confirmed that he has been referred to Hospice and that care coordination is in progress.   Plan Telephone call to [facility] within the next week to verify that they have been able to contact the patient/caregiver and that services are being arranged.  [name], RN-BC, BSN Nurse Case Manager [facility] Ph: [phone]

## 2018-02-02 ENCOUNTER — Encounter: Payer: Self-pay | Admitting: Family Medicine

## 2018-02-05 ENCOUNTER — Ambulatory Visit: Payer: Self-pay | Admitting: *Deleted

## 2018-02-05 NOTE — Chronic Care Management (AMB) (Signed)
  Care Management   Note  02/05/2018 Name: Kevin Ho MRN: [id number] DOB: Nov 20, 1931  Outgoing telephone call to [facility] at [phone] to confirm receipt of referral and was provided verbal confirmation that hospice services have been arranged for [name].  Follow Up Plan:  Care management services are not needed at this time but we will remain available for assistance to patient and/or family and to [facility] as needed.    [name], RN-BC, BSN Nurse Case Manager [facility] Ph: [phone]

## 2018-02-14 ENCOUNTER — Ambulatory Visit (INDEPENDENT_AMBULATORY_CARE_PROVIDER_SITE_OTHER): Payer: Medicare Other

## 2018-02-14 DIAGNOSIS — J449 Chronic obstructive pulmonary disease, unspecified: Secondary | ICD-10-CM | POA: Diagnosis not present

## 2018-02-14 DIAGNOSIS — E43 Unspecified severe protein-calorie malnutrition: Secondary | ICD-10-CM

## 2018-02-14 DIAGNOSIS — C3410 Malignant neoplasm of upper lobe, unspecified bronchus or lung: Secondary | ICD-10-CM | POA: Diagnosis not present

## 2018-02-14 DIAGNOSIS — C140 Malignant neoplasm of pharynx, unspecified: Secondary | ICD-10-CM | POA: Diagnosis not present

## 2018-05-15 ENCOUNTER — Other Ambulatory Visit: Payer: Self-pay

## 2018-05-15 ENCOUNTER — Ambulatory Visit (INDEPENDENT_AMBULATORY_CARE_PROVIDER_SITE_OTHER): Payer: Medicare Other

## 2018-05-15 DIAGNOSIS — C3410 Malignant neoplasm of upper lobe, unspecified bronchus or lung: Secondary | ICD-10-CM | POA: Diagnosis not present

## 2018-05-15 DIAGNOSIS — J449 Chronic obstructive pulmonary disease, unspecified: Secondary | ICD-10-CM | POA: Diagnosis not present

## 2018-05-15 DIAGNOSIS — E43 Unspecified severe protein-calorie malnutrition: Secondary | ICD-10-CM

## 2018-05-15 DIAGNOSIS — C140 Malignant neoplasm of pharynx, unspecified: Secondary | ICD-10-CM

## 2018-06-17 ENCOUNTER — Telehealth: Payer: Self-pay | Admitting: Family Medicine

## 2018-06-27 ENCOUNTER — Telehealth: Payer: Self-pay | Admitting: *Deleted

## 2018-06-27 NOTE — Telephone Encounter (Signed)
FYI - VM from [place] w/ Hospice She has seen pt 2x this week, just wanting to let you know that he is declining, she will be seeing him now 3 x a week. He is more tired, BP is low, circulation in his feet is worse, he's more dizzy, not eating or drinking even though he says he is, [place] can tell he is dehydrated.

## 2018-07-02 ENCOUNTER — Other Ambulatory Visit: Payer: Self-pay

## 2018-07-02 ENCOUNTER — Telehealth (INDEPENDENT_AMBULATORY_CARE_PROVIDER_SITE_OTHER): Payer: Medicare Other | Admitting: Family Medicine

## 2018-07-02 ENCOUNTER — Encounter: Payer: Self-pay | Admitting: Family Medicine

## 2018-07-02 DIAGNOSIS — J449 Chronic obstructive pulmonary disease, unspecified: Secondary | ICD-10-CM

## 2018-07-02 DIAGNOSIS — Z515 Encounter for palliative care: Secondary | ICD-10-CM | POA: Diagnosis not present

## 2018-07-02 DIAGNOSIS — C3412 Malignant neoplasm of upper lobe, left bronchus or lung: Secondary | ICD-10-CM

## 2018-07-02 NOTE — Progress Notes (Signed)
Subjective:    Patient ID: Kevin Ho, male    DOB: 05-29-1931, 85 y.o.   MRN: [id number]   HPI: Kevin Ho is a 85 y.o. male presenting for follow up of end-stage, terminal lung cancer. Followed by hospice. Taking tramadol and xanax. Pain well controlled at back and neck. Gets anxious so calms, able to sleep with xanax. Had a bad week last week. BP dropped. A little better this week. Needs statement about competency. Has been under treatment for dementia. Son needss to be able to give direction  o Hospice when time is short. Appetite is variable. He is taking ensure and water, etc.   Depression screen [facility] 2/9 12/18/2017 10/03/2017 10/02/2017 03/11/2017 02/28/2017  Decreased Interest 0 0 1 0 0  Down, Depressed, Hopeless 0 0 0 1 1  PHQ - 2 Score 0 0 1 1 1   Altered sleeping - - - - -  Tired, decreased energy - - - - -  Change in appetite - - - - -  Feeling bad or failure about yourself  - - - - -  Trouble concentrating - - - - -  Moving slowly or fidgety/restless - - - - -  Suicidal thoughts - - - - -  PHQ-9 Score - - - - -     Relevant past medical, surgical, family and social history reviewed and updated as indicated.  Interim medical history since our last visit reviewed. Allergies and medications reviewed and updated.  ROS:  Review of Systems  Constitutional: Negative for activity change (very minimal activity), appetite change (up this week, bad last week. Taking nsure) and fever.  Cardiovascular: Negative for chest pain.  Musculoskeletal: Positive for back pain and neck pain (controlled with tramadol).  Psychiatric/Behavioral: Positive for agitation and confusion.     Social History   Tobacco Use  Smoking Status Current Every Day Smoker  . Packs/day: 1.00  . Years: 72.00  . Pack years: 72.00  . Types: Cigarettes  . Start date: 01/16/1943  Smokeless Tobacco Never Used  Tobacco Comment   -started age 84, currenlty smoking 1 ppd       Objective:     Wt  Readings from Last 3 Encounters:  01/24/18 111 lb (50.3 kg)  12/18/17 113 lb (51.3 kg)  10/23/17 115 lb (52.2 kg)     Exam deferred. Pt. Harboring due to COVID 19. Phone visit performed.   Assessment & Plan:   1. Primary cancer of left upper lobe of lung ([place])   2. Chronic obstructive pulmonary disease, unspecified COPD type ([place])   3. Palliative care encounter     No orders of the defined types were placed in this encounter.   No orders of the defined types were placed in this encounter.     Diagnoses and all orders for this visit:  Primary cancer of left upper lobe of lung ([place])  Chronic obstructive pulmonary disease, unspecified COPD type [facility])  Palliative care encounter    Virtual Visit via telephone Note  I discussed the limitations, risks, security and privacy concerns of performing an evaluation and management service by telephone and the availability of in person appointments. The patient was identified with two identifiers. Pt.expressed understanding and agreed to proceed. Pt. Is at home. Dr. Livia Snellen is in his office.  Follow Up Instructions:   I discussed the assessment and treatment plan with the patient. The patient was provided an opportunity to ask questions and all were answered. The patient  agreed with the plan and demonstrated an understanding of the instructions.   The patient was advised to call back or seek an in-person evaluation if the symptoms worsen or if the condition fails to improve as anticipated.   Total minutes including chart review and phone contact time: 25   Follow up plan: Return if symptoms worsen or fail to improve.  [name], MD [place]

## 2018-07-11 ENCOUNTER — Other Ambulatory Visit: Payer: Self-pay | Admitting: Family Medicine

## 2018-07-12 ENCOUNTER — Other Ambulatory Visit: Payer: Self-pay | Admitting: Family Medicine

## 2018-07-16 ENCOUNTER — Telehealth: Payer: Medicare Other

## 2018-08-12 ENCOUNTER — Telehealth: Payer: Self-pay | Admitting: Family Medicine

## 2018-08-12 NOTE — Chronic Care Management (AMB) (Signed)
°  Chronic Care Management   Outreach Note  08/12/2018 Name: Kevin Ho MRN: [id number] DOB: [birth year]  Referred by: [name], MD Reason for referral : Chronic Care Management (Initial CCM outreach was unsuccessful. )   An unsuccessful telephone outreach was attempted today. The patient was referred to the case management team by for assistance with chronic care management and care coordination.   Follow Up Plan: A HIPPA compliant phone message was left for the patient providing contact information and requesting a return call.  The care management team will reach out to the patient again over the next 7 days.  If patient returns call to provider office, please advise to call [place] at [place]  [email or web address]   ??[phone]

## 2018-08-12 NOTE — Chronic Care Management (AMB) (Signed)
Chronic Care Management   Note  08/12/2018 Name: TERRICK ALLRED MRN: [id number] DOB: [birth year]  Rayshad Riviello Nickolas is a 85 y.o. year old male who is a primary care patient of Stacks, [name], MD. I reached out to Marquita Palms [place]augh by phone today in response to a referral sent by Mr. Haston Casebolt Younce's health plan.    Mr. Dimattia was given information about Chronic Care Management services today including:  1. CCM service includes personalized support from designated clinical staff supervised by his physician, including individualized plan of care and coordination with other care providers 2. 24/7 contact phone numbers for assistance for urgent and routine care needs. 3. Service will only be billed when office clinical staff spend 20 minutes or more in a month to coordinate care. 4. Only one practitioner may furnish and bill the service in a calendar month. 5. The patient may stop CCM services at any time (effective at the end of the month) by phone call to the office staff. 6. The patient will be responsible for cost sharing (co-pay) of up to 20% of the service fee (after annual deductible is met).  Patient did not agree to enrollment in care management services and does not wish to consider at this time.  Follow up plan: The patient has been provided with contact information for the chronic care management team and has been advised to call with any health related questions or concerns.   [place]  [email or web address]   ??[phone]

## 2018-08-27 ENCOUNTER — Telehealth: Payer: Self-pay | Admitting: Family Medicine

## 2018-08-27 NOTE — Telephone Encounter (Signed)
Tye [place] talked to them

## 2018-09-28 IMAGING — CT CT CHEST SUPER D W/O CM
2 of 4 series · 15 of 36 positions shown, 18 images · non-contrast
Comparison: Chest CT 06/01/2016.  PET-CT 06/29/2016.

CLINICAL DATA: Hypermetabolic, spiculated left upper lobe mass. Pre
bronchoscopy planning.

EXAM:
CT CHEST WITHOUT CONTRAST
TECHNIQUE: Multidetector CT imaging of the chest was performed using thin slice
collimation for electromagnetic bronchoscopy planning purposes,
without intravenous contrast.

[Series 4: thins · axial · 0.72mm/px · z∈[+1239,+1535]mm · 12 of 414 slices shown, 15 images]
[im 22/414  mediastinal]
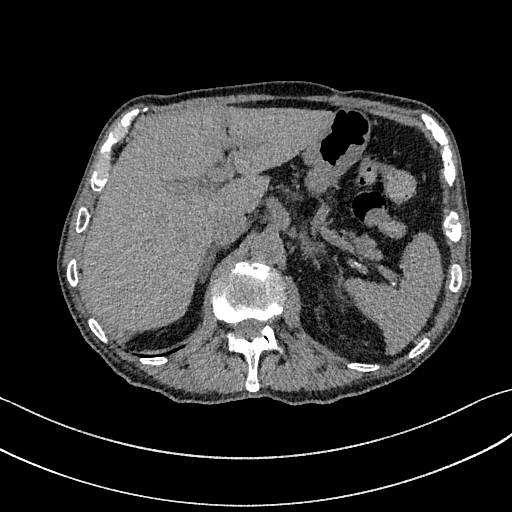
[im 22/414  lung]
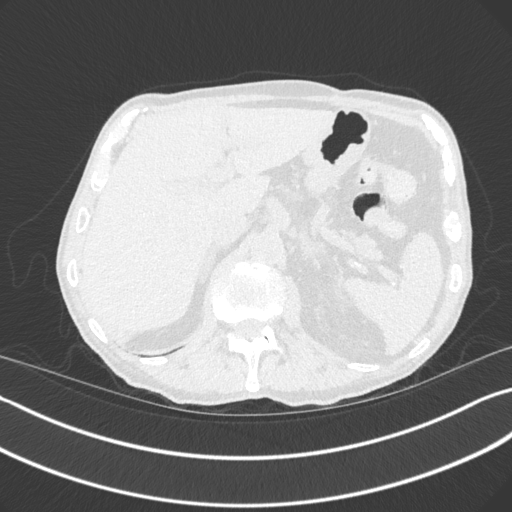
[im 66/414  lung]
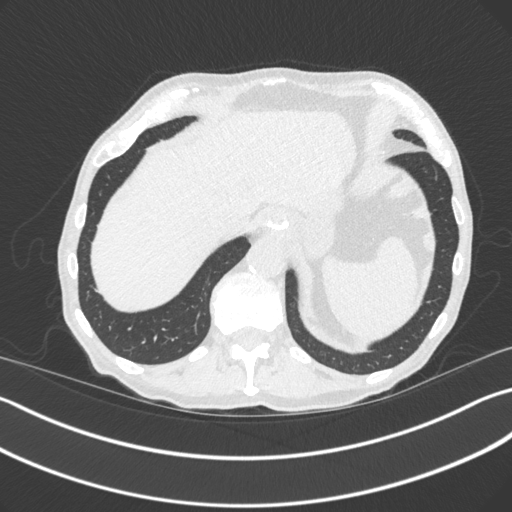
[im 87/414  lung]
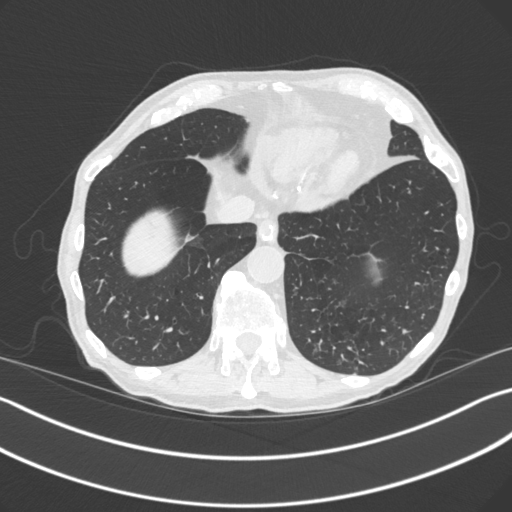
[im 131/414  lung]
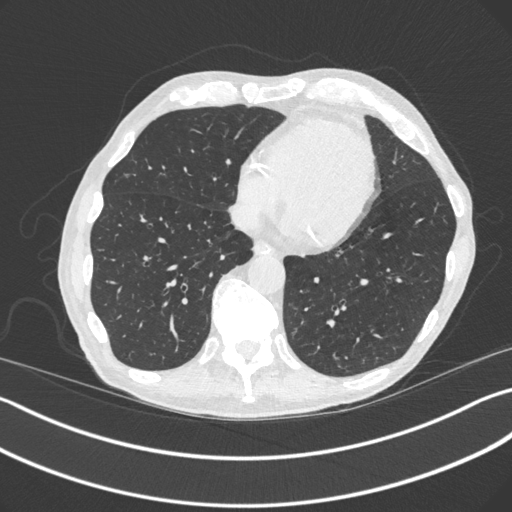
[im 153/414  mediastinal]
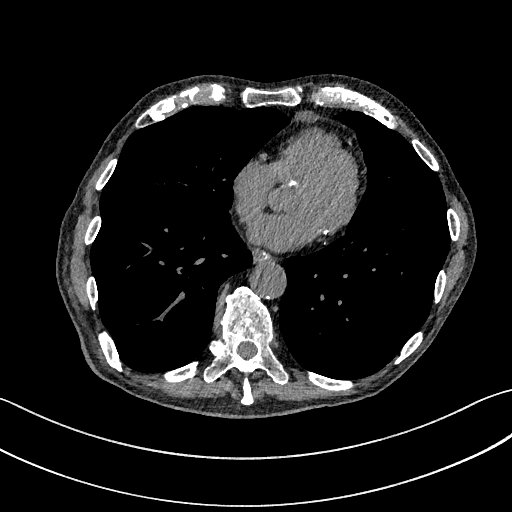
[im 153/414  lung]
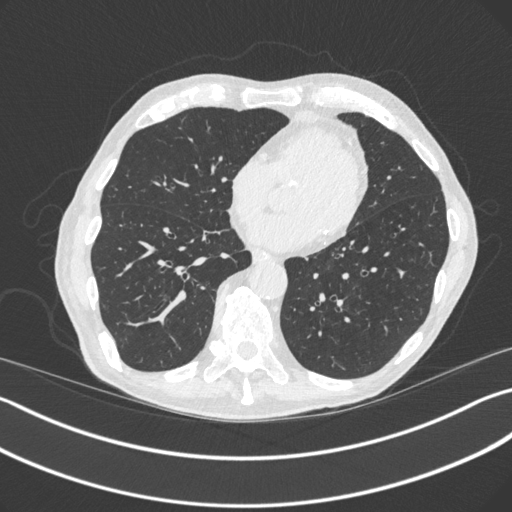
[im 196/414  lung]
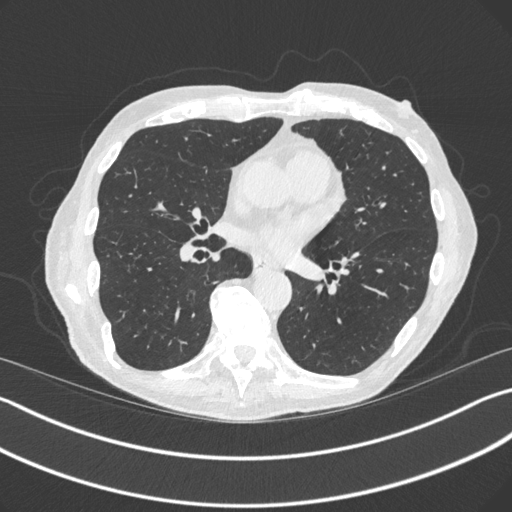
[im 218/414  lung]
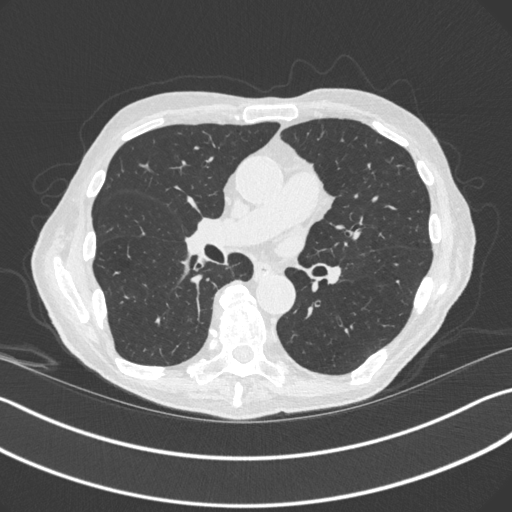
[im 261/414  lung]
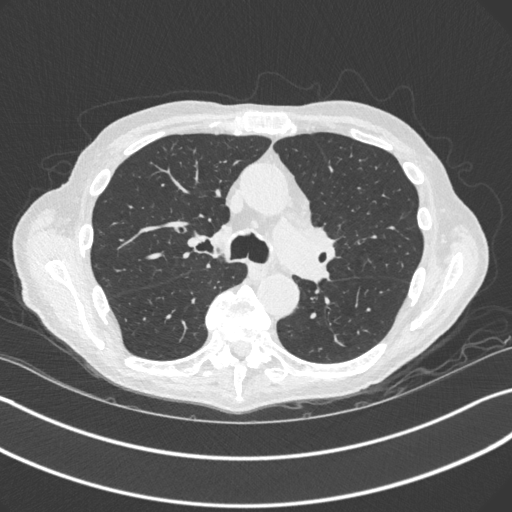
[im 283/414  mediastinal]
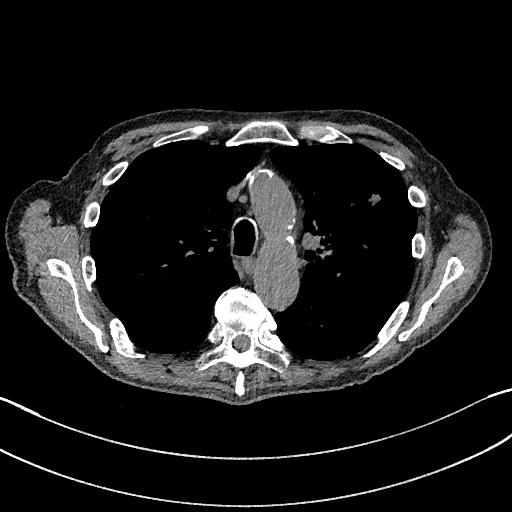
[im 283/414  lung]
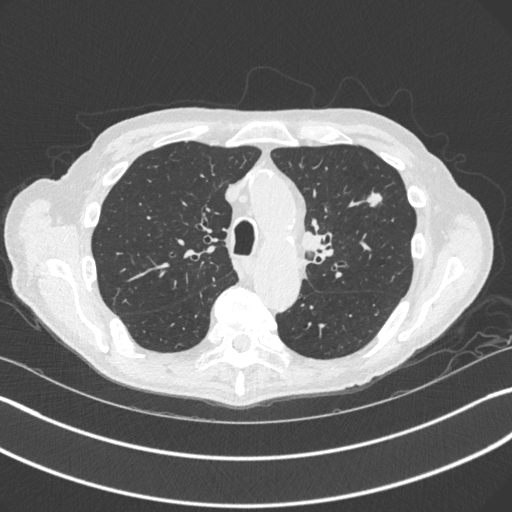
[im 327/414  lung]
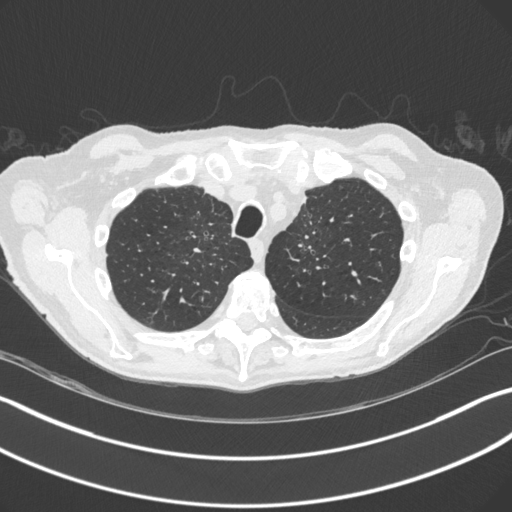
[im 348/414  lung]
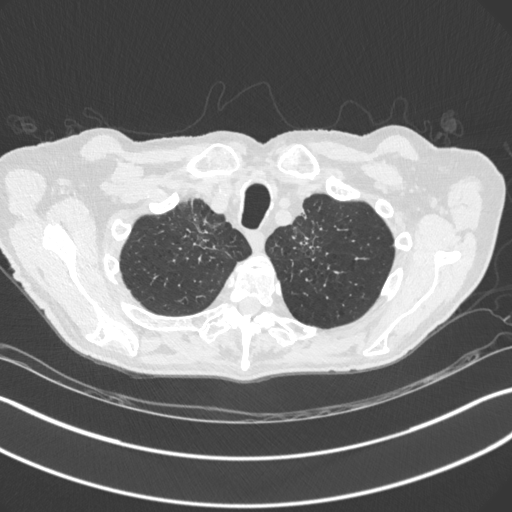
[im 392/414  lung]
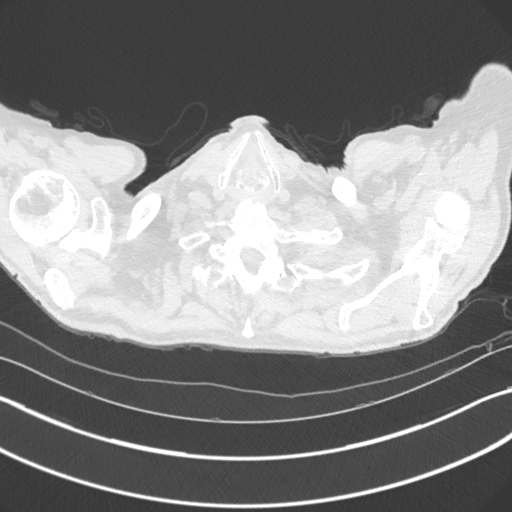

[Series 5: coronal · coronal · 0.68mm/px · 3 of 85 slices shown]
[im 17/85  lung]
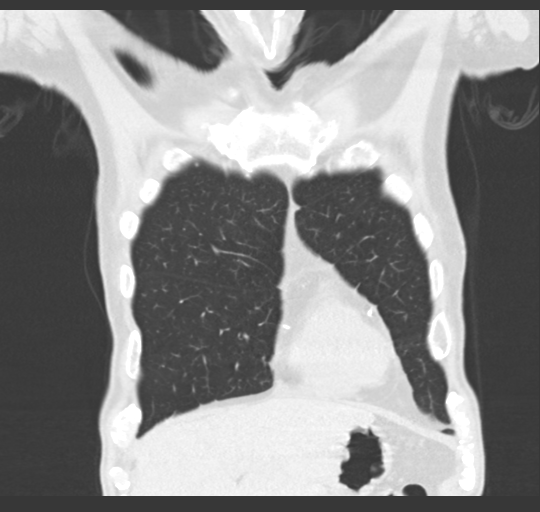
[im 34/85  lung]
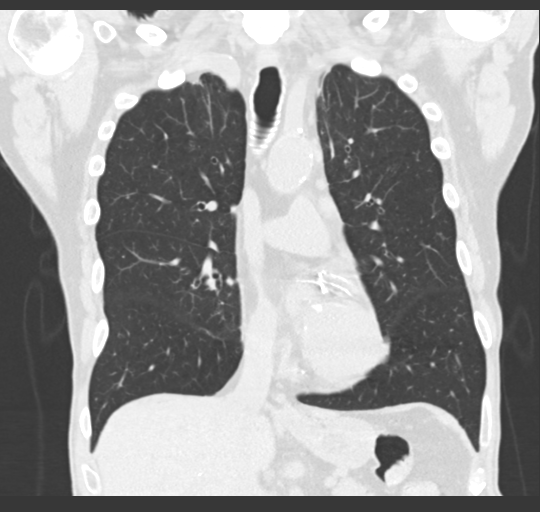
[im 51/85  lung]
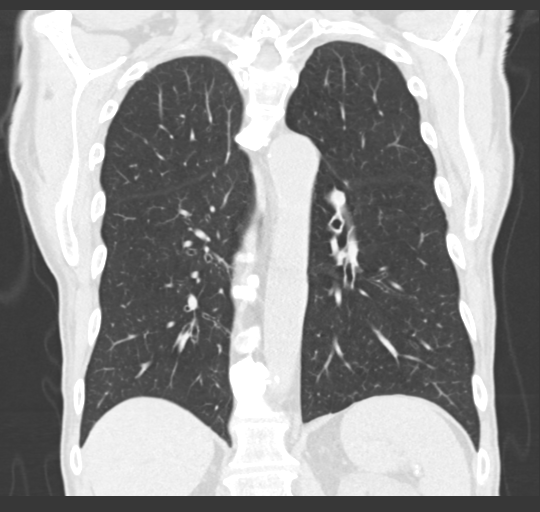

[15 of 36 positions shown; findings below may reference images not displayed]

FINDINGS: Cardiovascular: Extensive coronary artery atherosclerosis with
lesser atherosclerosis of the aorta and great vessels. The heart
size is normal. There is no pericardial effusion.

Mediastinum/Nodes: The previously demonstrated hypermetabolic left
hilar and AP window nodes are grossly unchanged, suboptimally
evaluated without contrast. AP window node measures 12 mm on image
67. No progressive adenopathy identified. There is stable high
density within the wall of the distal esophagus, presumably
iatrogenic. The trachea and thyroid gland appear unremarkable.

Lungs/Pleura: There is no pleural effusion. Mild emphysema and
biapical scarring from previous radiation therapy. The spiculated
left upper lobe nodule has not significantly changed, measuring 11 x
8 mm on image 54. There are no new or enlarging pulmonary nodules.
Mild diffuse central airway thickening is present.

Upper abdomen: The visualized upper abdomen appears stable without
suspicious findings. There is heterogeneity of the visualize left
kidney which demonstrated multiple cysts on previous examination.

Musculoskeletal/Chest wall: There is no chest wall mass or
suspicious osseous finding. There are old rib fractures bilaterally
and diffuse thoracic spondylosis.
IMPRESSION: 1. Imaging for bronchoscopy guidance.
2. Stable spiculated left upper lobe nodule consistent with
bronchogenic carcinoma. Left hilar and AP window adenopathy appears
unchanged.
3. No acute findings.
4. Extensive coronary artery atherosclerosis. Aortic Atherosclerosis
([id number]).

## 2018-10-06 ENCOUNTER — Other Ambulatory Visit: Payer: Self-pay | Admitting: *Deleted

## 2018-10-06 NOTE — Telephone Encounter (Signed)
Erroneous encounter

## 2018-10-09 ENCOUNTER — Other Ambulatory Visit: Payer: Self-pay | Admitting: Family Medicine

## 2018-10-09 MED ORDER — LEVOTHYROXINE SODIUM 88 MCG PO TABS: 88.0000 ug | ORAL_TABLET | Freq: Every day | ORAL | 0 refills | Status: DC

## 2018-10-13 ENCOUNTER — Ambulatory Visit (INDEPENDENT_AMBULATORY_CARE_PROVIDER_SITE_OTHER): Payer: Medicare Other | Admitting: Family Medicine

## 2018-10-13 ENCOUNTER — Encounter: Payer: Self-pay | Admitting: Family Medicine

## 2018-10-13 DIAGNOSIS — F0390 Unspecified dementia without behavioral disturbance: Secondary | ICD-10-CM | POA: Diagnosis not present

## 2018-10-13 DIAGNOSIS — E038 Other specified hypothyroidism: Secondary | ICD-10-CM

## 2018-10-13 DIAGNOSIS — C3412 Malignant neoplasm of upper lobe, left bronchus or lung: Secondary | ICD-10-CM

## 2018-10-13 MED ORDER — DONEPEZIL HCL 10 MG PO TABS: ORAL_TABLET | ORAL | 1 refills | Status: DC

## 2018-10-13 MED ORDER — LEVOTHYROXINE SODIUM 88 MCG PO TABS: 88.0000 ug | ORAL_TABLET | Freq: Every day | ORAL | 0 refills | Status: DC

## 2018-10-13 NOTE — Progress Notes (Signed)
Subjective:    Patient ID: Kevin Ho, male    DOB: 1931/12/20, 85 y.o.   MRN: [id number]   HPI: Kevin Ho is a 85 y.o. male presenting for  follow-up on  thyroid. The patient has a history of hypothyroidism for many years. It has been stable recently. Pt. denies any change in  voice, loss of hair, heat or cold intolerance.  Patient denies constipation and diarrhea. No myxedema. Medication is as noted below. Verified that pt is taking it daily on an empty stomach. Well tolerated.  UNder care of hospice due to terminal lung cancer. Weight has dropped to 110 lbs.  Still breathing without assistance. No O2.   He is lucid. Using aricept to maintain mental acuity. Stable. History given by daughter  Depression screen [facility] 2/9 12/18/2017 10/03/2017 10/02/2017 03/11/2017 02/28/2017  Decreased Interest 0 0 1 0 0  Down, Depressed, Hopeless 0 0 0 1 1  PHQ - 2 Score 0 0 1 1 1   Altered sleeping - - - - -  Tired, decreased energy - - - - -  Change in appetite - - - - -  Feeling bad or failure about yourself  - - - - -  Trouble concentrating - - - - -  Moving slowly or fidgety/restless - - - - -  Suicidal thoughts - - - - -  PHQ-9 Score - - - - -     Relevant past medical, surgical, family and social history reviewed and updated as indicated.  Interim medical history since our last visit reviewed. Allergies and medications reviewed and updated.  ROS:  Review of Systems  Constitutional: Negative for fever.  Cardiovascular: Negative for chest pain.  Musculoskeletal: Negative for arthralgias.  Skin: Negative for rash.     Social History   Tobacco Use  Smoking Status Current Every Day Smoker  . Packs/day: 1.00  . Years: 72.00  . Pack years: 72.00  . Types: Cigarettes  . Start date: 01/16/1943  Smokeless Tobacco Never Used  Tobacco Comment   -started age 68, currenlty smoking 1 ppd       Objective:     Wt Readings from Last 3 Encounters:  01/24/18 111 lb (50.3 kg)   12/18/17 113 lb (51.3 kg)  10/23/17 115 lb (52.2 kg)     Exam deferred. Pt. Harboring due to COVID 19. Phone visit performed.   Assessment & Plan:   1. Other specified hypothyroidism   2. Primary cancer of left upper lobe of lung ([place])   3. Dementia without behavioral disturbance, unspecified dementia type ([place])     Meds ordered this encounter  Medications  . donepezil (ARICEPT) 10 MG tablet    Sig: TAKE 1 TABLET BY MOUTH EVERYDAY AT BEDTIME    Dispense:  90 tablet    Refill:  1  . levothyroxine (SYNTHROID) 88 MCG tablet    Sig: Take 1 tablet (88 mcg total) by mouth daily.    Dispense:  90 tablet    Refill:  0    No orders of the defined types were placed in this encounter.     Diagnoses and all orders for this visit:  Other specified hypothyroidism  Primary cancer of left upper lobe of lung ([place])  Dementia without behavioral disturbance, unspecified dementia type ([place])  Other orders -     donepezil (ARICEPT) 10 MG tablet; TAKE 1 TABLET BY MOUTH EVERYDAY AT BEDTIME -     levothyroxine (SYNTHROID) 88 MCG tablet; Take  1 tablet (88 mcg total) by mouth daily.    Virtual Visit via telephone Note  I discussed the limitations, risks, security and privacy concerns of performing an evaluation and management service by telephone and the availability of in person appointments. The patient was identified with two identifiers. Pt.expressed understanding and agreed to proceed. Pt. Is at home. Dr. Livia Snellen is in his office.  Follow Up Instructions:   I discussed the assessment and treatment plan with the patient. The patient was provided an opportunity to ask questions and all were answered. The patient agreed with the plan and demonstrated an understanding of the instructions.   The patient was advised to call back or seek an in-person evaluation if the symptoms worsen or if the condition fails to improve as anticipated.   Total minutes including chart review and phone contact  time: 13   Follow up plan: No follow-ups on file.  [name], MD [place]

## 2018-10-24 ENCOUNTER — Other Ambulatory Visit: Payer: Self-pay | Admitting: Family Medicine

## 2018-11-05 ENCOUNTER — Other Ambulatory Visit: Payer: Self-pay | Admitting: Family Medicine

## 2018-11-05 NOTE — Telephone Encounter (Signed)
To pharmacy: [facility] PATIENT

## 2018-12-03 ENCOUNTER — Other Ambulatory Visit: Payer: Self-pay | Admitting: *Deleted

## 2018-12-04 ENCOUNTER — Other Ambulatory Visit: Payer: Self-pay | Admitting: *Deleted

## 2018-12-04 MED ORDER — DONEPEZIL HCL 10 MG PO TABS: ORAL_TABLET | ORAL | 0 refills | Status: AC

## 2018-12-04 MED ORDER — LEVOTHYROXINE SODIUM 88 MCG PO TABS: 88.0000 ug | ORAL_TABLET | Freq: Every day | ORAL | 0 refills | Status: AC

## 2019-01-06 ENCOUNTER — Other Ambulatory Visit: Payer: Self-pay | Admitting: Family Medicine

## 2019-01-26 ENCOUNTER — Telehealth: Payer: Self-pay | Admitting: *Deleted

## 2019-01-26 NOTE — Telephone Encounter (Signed)
VM from [place] w/ Hospice FYI: Pt is in decline since Friday, she is now seeing him everday May pass away in the next few days

## 2019-01-27 NOTE — Telephone Encounter (Signed)
My condolences to the family! WS

## 2019-01-30 ENCOUNTER — Telehealth: Payer: Self-pay | Admitting: Family Medicine

## 2019-01-30 NOTE — Telephone Encounter (Signed)
Needs to speak with Tye [place] about getting a death certificate for patient for cremation.

## 2019-02-02 NOTE — Telephone Encounter (Signed)
Death certification was received and signed by Dr. Warrick Parisian due to of Dr. Livia Snellen being out office for 2 days

## 2019-02-16 DEATH — deceased
